# Patient Record
Sex: Female | Born: 1955 | Race: Black or African American | Hispanic: No | Marital: Single | Smoking: Never smoker
Health system: Southern US, Community
[De-identification: ages and names within clinical notes are randomized; demographics above are authoritative.]

## PROBLEM LIST (undated history)

## (undated) DIAGNOSIS — E119 Type 2 diabetes mellitus without complications: Secondary | ICD-10-CM

## (undated) DIAGNOSIS — Z9221 Personal history of antineoplastic chemotherapy: Secondary | ICD-10-CM

## (undated) DIAGNOSIS — C801 Malignant (primary) neoplasm, unspecified: Secondary | ICD-10-CM

## (undated) DIAGNOSIS — I10 Essential (primary) hypertension: Secondary | ICD-10-CM

## (undated) HISTORY — PX: ABDOMINAL HYSTERECTOMY: SHX81

## (undated) HISTORY — DX: Essential (primary) hypertension: I10

## (undated) HISTORY — DX: Type 2 diabetes mellitus without complications: E11.9

---
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
## 2002-07-24 HISTORY — PX: APPENDECTOMY: SHX54

## 2009-07-24 HISTORY — PX: BREAST BIOPSY: SHX20

## 2011-06-21 ENCOUNTER — Ambulatory Visit: Payer: Self-pay

## 2012-07-31 ENCOUNTER — Ambulatory Visit: Payer: Self-pay

## 2013-08-04 ENCOUNTER — Ambulatory Visit: Payer: Self-pay

## 2013-08-14 ENCOUNTER — Ambulatory Visit: Payer: Self-pay

## 2013-08-14 IMAGING — US US BREAST*R* LIMITED INC AXILLA
1 series · 7 of 7 positions shown · non-contrast
Comparison: Previous examinations.

CLINICAL DATA: Spontaneous clear right nipple discharge for the
past month.

EXAM:
DIGITAL DIAGNOSTIC  BILATERAL MAMMOGRAM WITH CAD
ULTRASOUND RIGHT BREAST

[Series 1: us breast*right* limited inc axilla · 0.08mm/px · 7 of 7 slices shown]
[im 1/7]
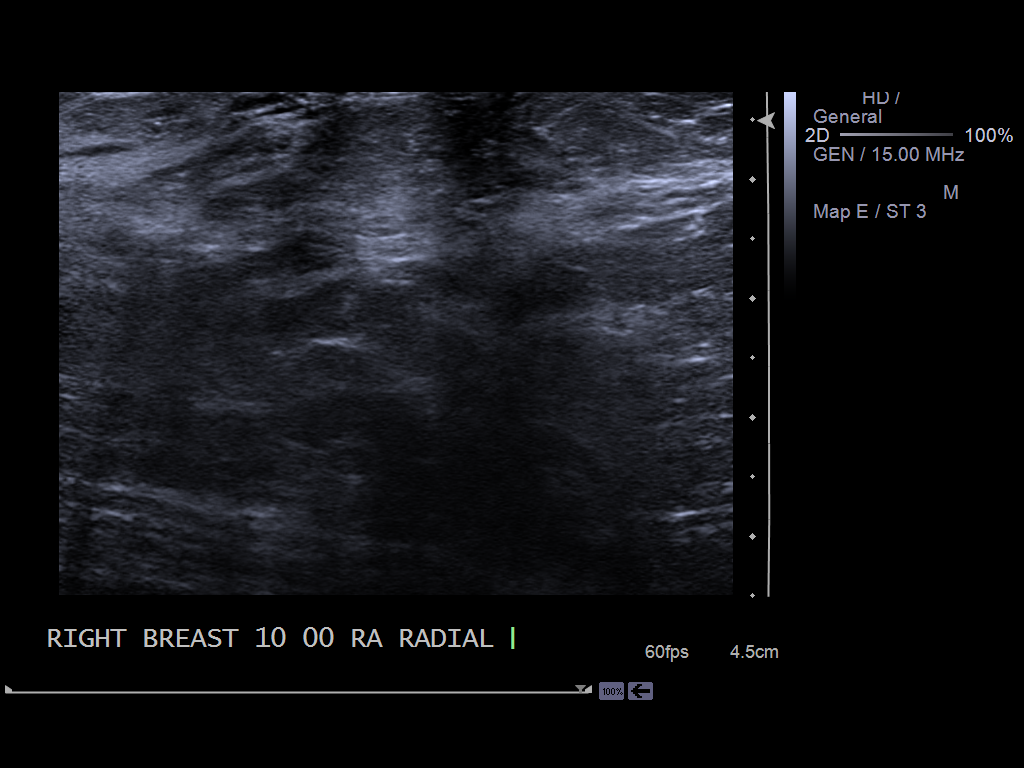
[im 2/7]
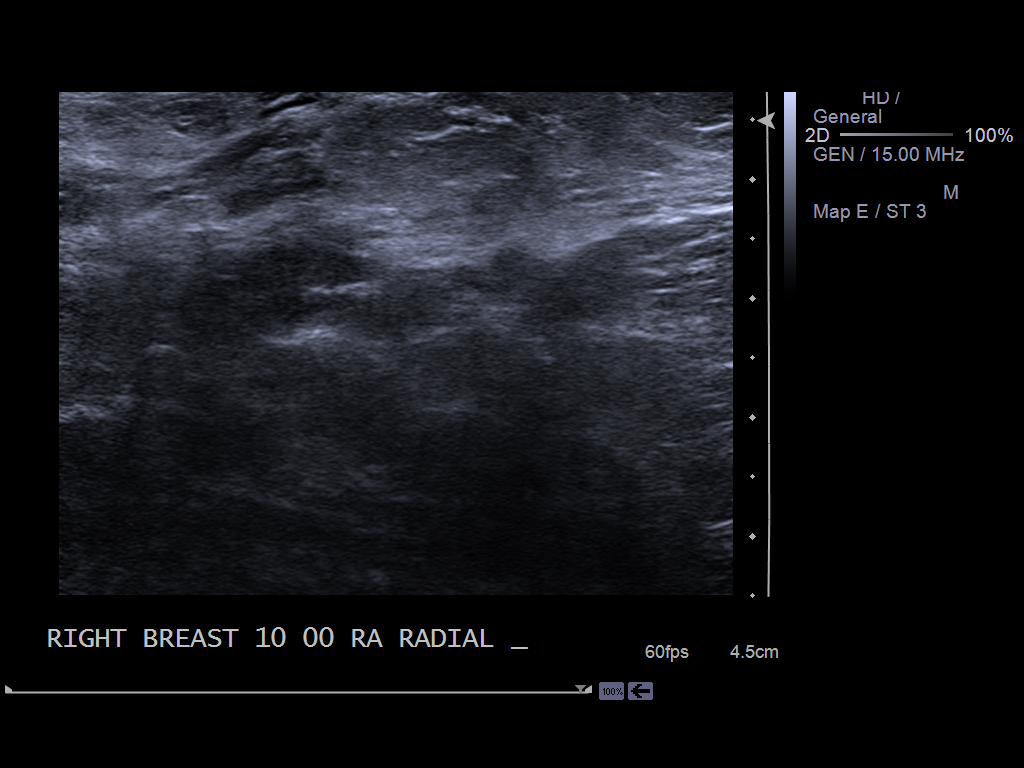
[im 3/7]
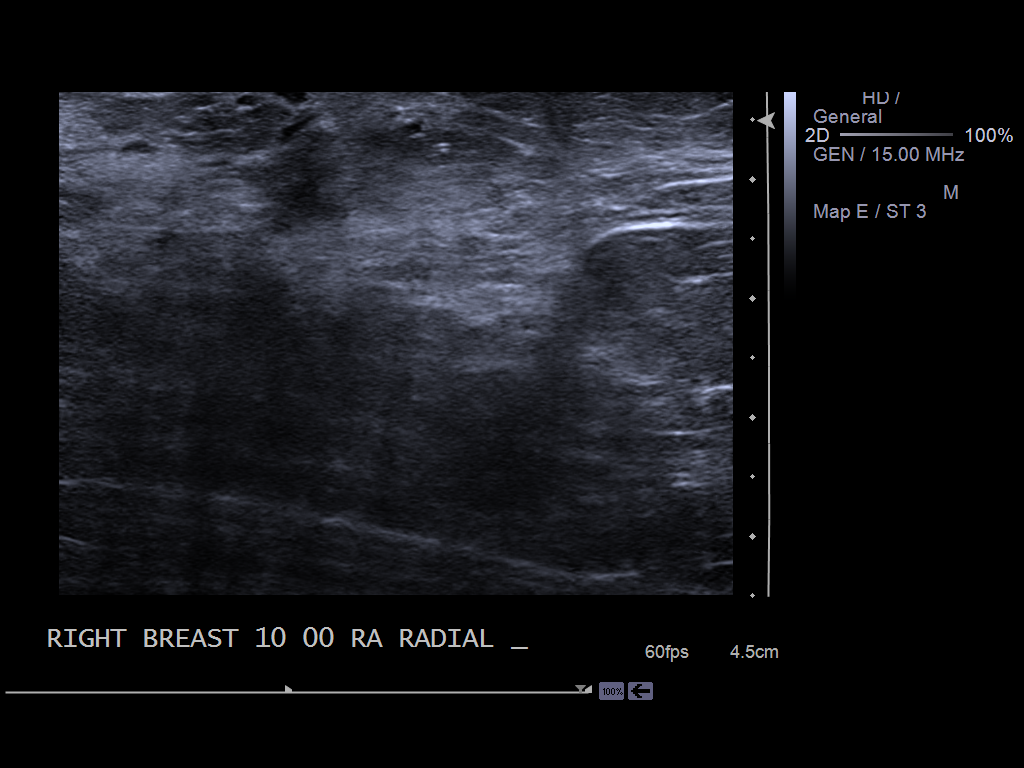
[im 4/7]
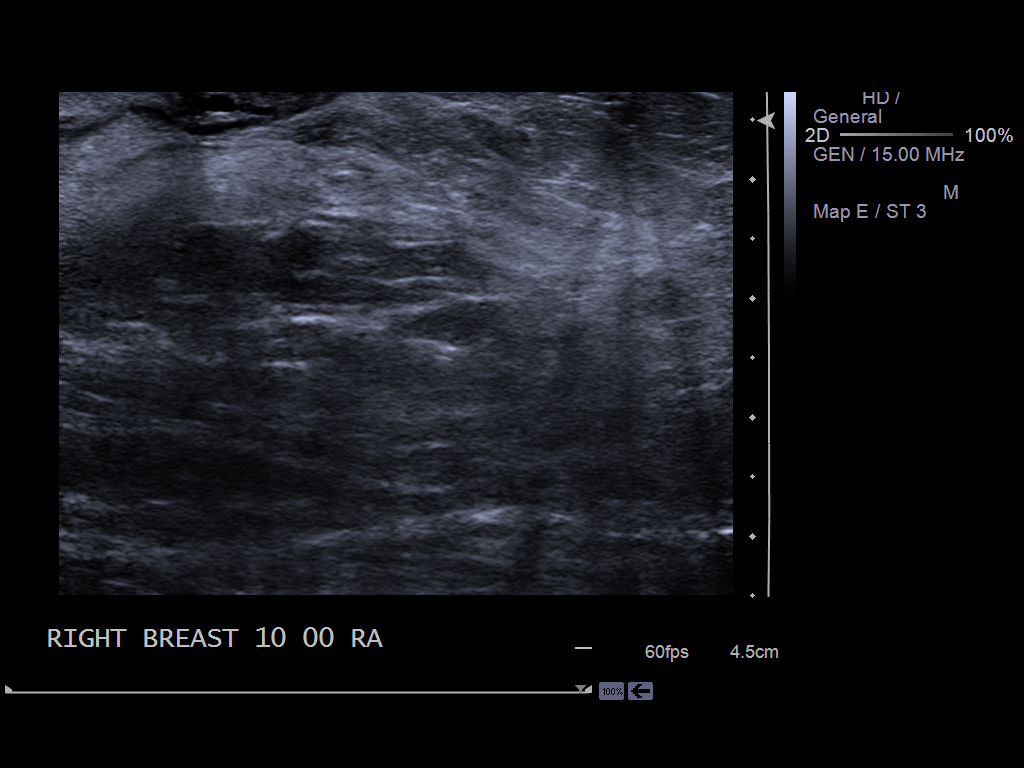
[im 5/7]
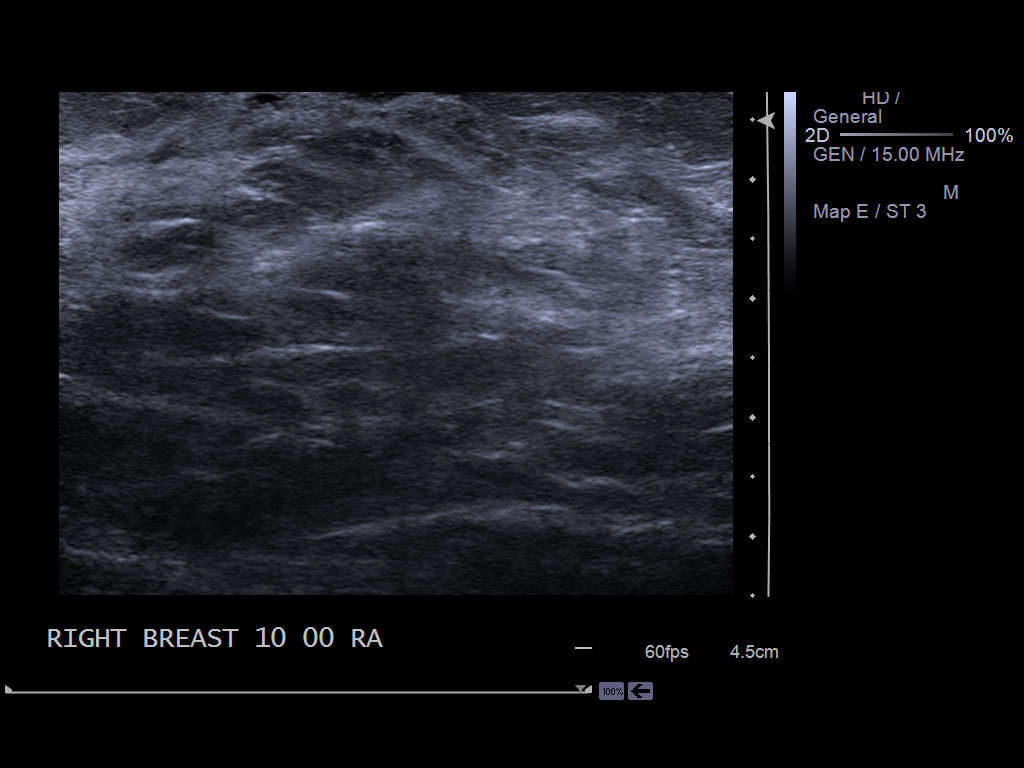
[im 6/7]
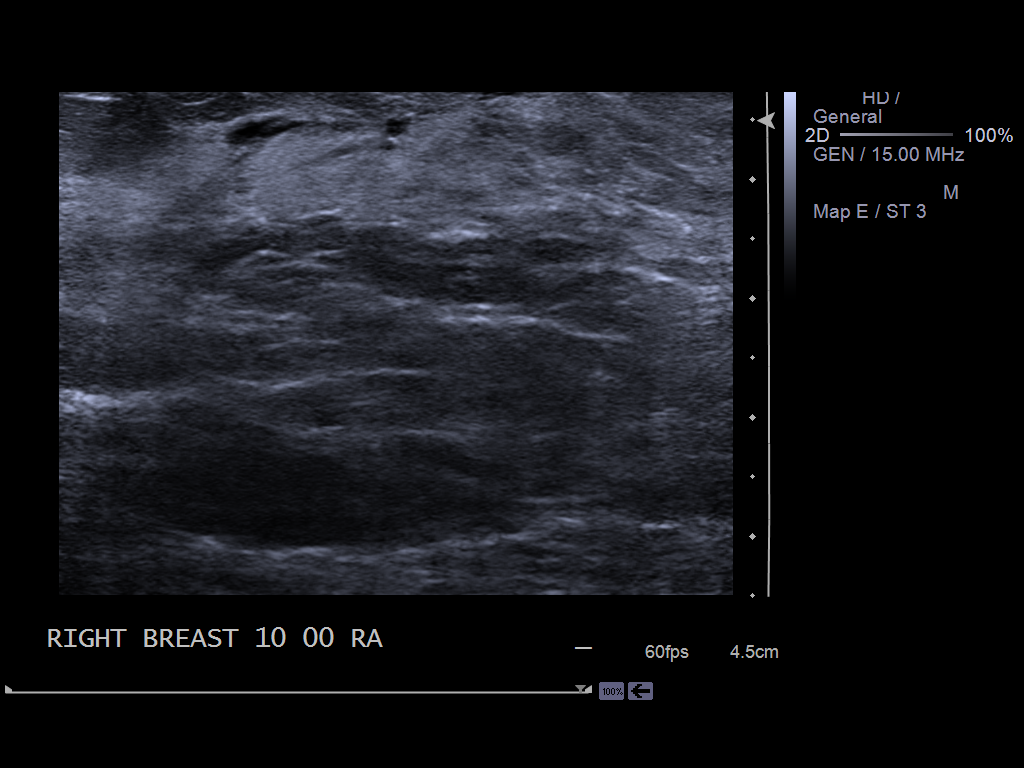
[im 7/7]
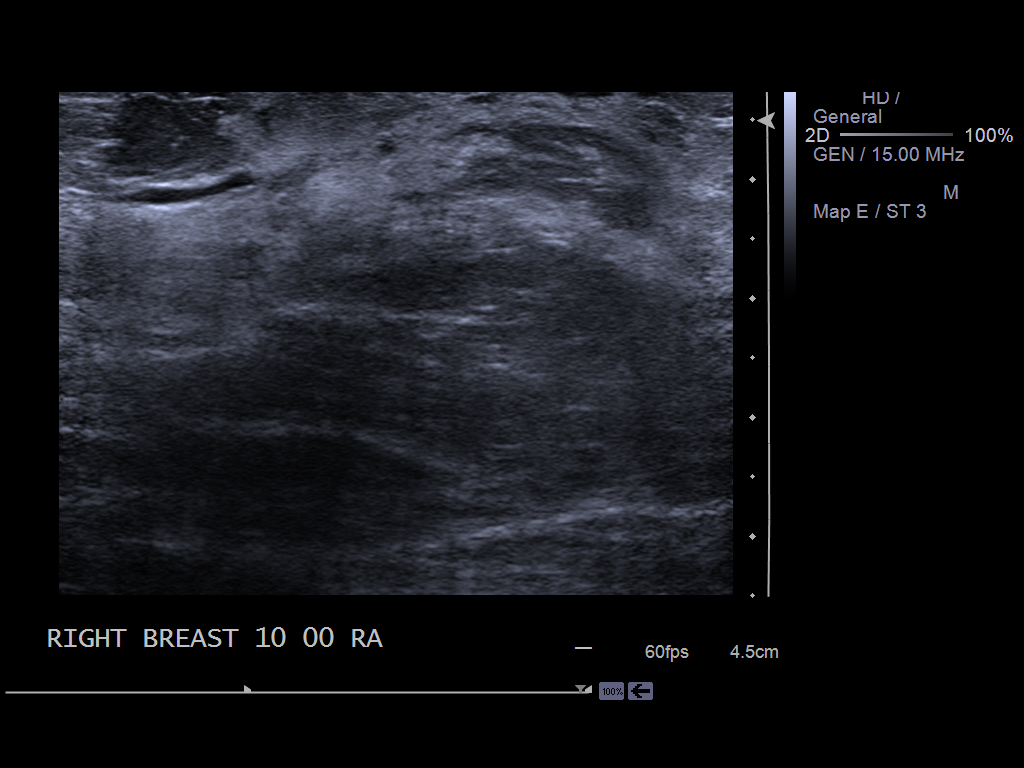

[7 of 7 positions shown; findings below may reference images not displayed]

ACR Breast Density Category b: There are scattered areas of
fibroglandular density.
FINDINGS: Stable mammographic appearance of the breasts with no new findings
suspicious for malignancy in either breast.

Mammographic images were processed with CAD.

On physical exam, no mass is palpable in the right breast. A small
amount of clear discharge was elicited from a single duct in the
central portion of the right nipple, slightly laterally and slightly
superiorly.

Ultrasound is performed, showing mildly dilated retroareolar ducts
on the right with no intraductal masses seen.
IMPRESSION: Mild right duct ectasia. No cause for the spontaneous single duct
discharge on the right seen at mammography or ultrasound. Further
evaluation with a right ductogram is recommended. This is scheduled
to follow.

RECOMMENDATION:
Right ductogram (scheduled follow).

I have discussed the findings and recommendations with the patient.
Results were also provided in writing at the conclusion of the
visit.

BI-RADS CATEGORY  0: Incomplete. Need additional imaging evaluation
and/or prior mammograms for comparison.

## 2013-08-19 ENCOUNTER — Ambulatory Visit (INDEPENDENT_AMBULATORY_CARE_PROVIDER_SITE_OTHER): Payer: PRIVATE HEALTH INSURANCE | Admitting: General Surgery

## 2013-08-19 ENCOUNTER — Encounter: Payer: Self-pay | Admitting: General Surgery

## 2013-08-19 VITALS — BP 140/80 | HR 78 | Resp 14 | Ht 66.0 in | Wt 211.0 lb

## 2013-08-19 DIAGNOSIS — N6452 Nipple discharge: Secondary | ICD-10-CM

## 2013-08-19 DIAGNOSIS — N6459 Other signs and symptoms in breast: Secondary | ICD-10-CM

## 2013-08-19 NOTE — Progress Notes (Signed)
Patient ID: Kristina Orozco, female   DOB: Apr 30, 1956, 58 y.o.   MRN: [id number]  Chief Complaint  Patient presents with  . Other    mammogram    HPI Kristina Orozco is a 58 y.o. female. who presents for a breast evaluation. The most recent mammogram was done on 08/14/13. The patient also had an ultrasound as well as a ductogram done at this time.Patient states she has been seeing clear fluid coming out of right nipple for about two months. Patient does perform regular self breast checks and gets regular mammograms done.    HPI  History reviewed. No pertinent past medical history.  Past Surgical History  Procedure Laterality Date  . Abdominal hysterectomy    . Appendectomy  2004  . Breast biopsy Left 2011    Family History  Problem Relation Age of Onset  . Pancreatic cancer Mother     Social History History  Substance Use Topics  . Smoking status: Never Smoker   . Smokeless tobacco: Never Used  . Alcohol Use: Yes    No Known Allergies  Current Outpatient Prescriptions  Medication Sig Dispense Refill  . calcium acetate (PHOSLO) 667 MG capsule Take by mouth 3 (three) times daily with meals.      . Multiple Vitamins-Minerals (MULTIVITAMIN WITH MINERALS) tablet Take 1 tablet by mouth daily.       No current facility-administered medications for this visit.    Review of Systems Review of Systems  Constitutional: Negative.   Respiratory: Negative.   Cardiovascular: Negative.     Blood pressure 140/80, pulse 78, resp. rate 14, height 5\' 6"  (1.676 m), weight 211 lb (95.709 kg).  Physical Exam Physical Exam  Constitutional: She is oriented to person, place, and time. She appears well-developed and well-nourished.  Eyes: Conjunctivae are normal.  Neck: Neck supple.  Cardiovascular: Normal rate, regular rhythm and normal heart sounds.   Pulmonary/Chest: Effort normal and breath sounds normal. Right breast exhibits nipple discharge ( watery drainage sourse is the upper outer  quadrant). Right breast exhibits no inverted nipple, no mass, no skin change and no tenderness. Left breast exhibits no inverted nipple, no mass, no nipple discharge, no skin change and no tenderness.  Abdominal: Bowel sounds are normal.  Lymphadenopathy:    She has no cervical adenopathy.    She has no axillary adenopathy.  Neurological: She is alert and oriented to person, place, and time.  Skin: Skin is warm and dry.    Data Reviewed Mammogram and ultrasound and ductogram reviewed  Assessment  Single duct with filling defect upper outer quadrant right breast needs excision of this . Discussed fully with patient     Plan    Patient to have surgery right ductal excision .      This patient's surgery has been scheduled for 08-28-13 at [facility]. Jeanella Anton, BCCCP, has been notified of plans for surgery.  Jazzy Parmer G 08/19/2013, 10:37 AM

## 2013-08-19 NOTE — Patient Instructions (Signed)
Patient's surgery has been scheduled for 08-28-13 at [facility].

## 2013-08-26 HISTORY — PX: BREAST BIOPSY: SHX20

## 2013-08-28 ENCOUNTER — Ambulatory Visit: Payer: Self-pay | Admitting: General Surgery

## 2013-08-28 DIAGNOSIS — N6459 Other signs and symptoms in breast: Secondary | ICD-10-CM

## 2013-08-28 HISTORY — PX: OTHER SURGICAL HISTORY: SHX169

## 2013-08-29 ENCOUNTER — Encounter: Payer: Self-pay | Admitting: General Surgery

## 2013-09-02 LAB — PATHOLOGY REPORT

## 2013-09-03 ENCOUNTER — Telehealth: Payer: Self-pay | Admitting: *Deleted

## 2013-09-03 ENCOUNTER — Encounter: Payer: Self-pay | Admitting: General Surgery

## 2013-09-03 NOTE — Telephone Encounter (Signed)
Notified patient as instructed, patient pleased. Discussed follow-up appointments, patient agrees  

## 2013-09-03 NOTE — Telephone Encounter (Signed)
Message copied by Carson Myrtle on Wed Sep 03, 2013  4:58 PM ------      Message from: Christene Lye      Created: Wed Sep 03, 2013  3:06 PM       Path showed intraductal papilloma. No atypia.      Please advise pt-results are ok. ------

## 2013-09-08 ENCOUNTER — Ambulatory Visit (INDEPENDENT_AMBULATORY_CARE_PROVIDER_SITE_OTHER): Payer: Self-pay | Admitting: General Surgery

## 2013-09-08 ENCOUNTER — Encounter: Payer: Self-pay | Admitting: General Surgery

## 2013-09-08 VITALS — BP 140/82 | HR 74 | Resp 12 | Ht 66.0 in | Wt 222.0 lb

## 2013-09-08 DIAGNOSIS — D249 Benign neoplasm of unspecified breast: Secondary | ICD-10-CM

## 2013-09-08 NOTE — Progress Notes (Signed)
This is a 58 year old female here today for right breast biopsy done on 08/26/13. Patient states she is doing well with no problems at this time.  Right areolar looks clean and healing well. Path showed intraductal papilloma.  Patient to return in five months.

## 2013-09-08 NOTE — Patient Instructions (Signed)
Patient to return in five months. Continue to do monthly self breast checks.

## 2014-02-09 ENCOUNTER — Encounter: Payer: Self-pay | Admitting: General Surgery

## 2014-02-09 ENCOUNTER — Ambulatory Visit (INDEPENDENT_AMBULATORY_CARE_PROVIDER_SITE_OTHER): Payer: PRIVATE HEALTH INSURANCE | Admitting: General Surgery

## 2014-02-09 VITALS — BP 122/80 | HR 80 | Resp 14 | Ht 66.0 in | Wt 214.0 lb

## 2014-02-09 DIAGNOSIS — D249 Benign neoplasm of unspecified breast: Secondary | ICD-10-CM

## 2014-02-09 DIAGNOSIS — D241 Benign neoplasm of right breast: Secondary | ICD-10-CM

## 2014-02-09 NOTE — Progress Notes (Signed)
Patient ID: Kristina Orozco, female   DOB: [birth year], 58 y.o.   MRN: [id number]  Chief Complaint  Patient presents with  . Follow-up    breast    HPI Kristina Orozco is a 58 y.o. female here today for her 5 month follow . Patient had a right breast biopsy done on 08/26/13. Patient states she is doing well at this time. No nipple discharge   HPI  Past Medical History  Diagnosis Date  . Hypertension   . Diabetes mellitus without complication     Past Surgical History  Procedure Laterality Date  . Abdominal hysterectomy    . Appendectomy  2004  . Breast biopsy Left 2011  . Breast biopsy Right 08/26/13  . Breast mass excision Right 08/28/13    papilloma    Family History  Problem Relation Age of Onset  . Pancreatic cancer Mother     Social History History  Substance Use Topics  . Smoking status: Never Smoker   . Smokeless tobacco: Never Used  . Alcohol Use: Yes    No Known Allergies  Current Outpatient Prescriptions  Medication Sig Dispense Refill  . calcium acetate (PHOSLO) 667 MG capsule Take by mouth 3 (three) times daily with meals.      . hydrochlorothiazide (HYDRODIURIL) 25 MG tablet Take 25 mg by mouth daily.      . Multiple Vitamins-Minerals (MULTIVITAMIN WITH MINERALS) tablet Take 1 tablet by mouth daily.       No current facility-administered medications for this visit.    Review of Systems Review of Systems  Constitutional: Negative.   Respiratory: Negative.   Cardiovascular: Negative.     Blood pressure 122/80, pulse 80, resp. rate 14, height 5\' 6"  (1.676 m), weight 214 lb (97.07 kg).  Physical Exam Physical Exam  Constitutional: She is oriented to person, place, and time. She appears well-developed and well-nourished.  Eyes: Conjunctivae are normal. No scleral icterus.  Neck: Neck supple. No thyromegaly present.  Pulmonary/Chest: Right breast exhibits no inverted nipple, no mass, no nipple discharge, no skin change and no tenderness. Left breast  exhibits no inverted nipple, no mass, no nipple discharge, no skin change and no tenderness.  Right breast incision well healed.   Lymphadenopathy:    She has no cervical adenopathy.    She has no axillary adenopathy.  Neurological: She is alert and oriented to person, place, and time.  Skin: Skin is warm and dry.    Data Reviewed Prior pathology.   Assessment    Statis post right breast mass excision. Intraductal papilloma. Normal breast exam today.     Plan    Patient to have mammogram in January 2016. She is to arrange this through [facility].        Kristina Orozco 02/10/2014, 5:32 AM

## 2014-02-09 NOTE — Patient Instructions (Signed)
Continue self breast exams. Call office for any new breast issues or concerns. Patient to return as needed.

## 2014-02-10 ENCOUNTER — Encounter: Payer: Self-pay | Admitting: General Surgery

## 2014-05-25 ENCOUNTER — Encounter: Payer: Self-pay | Admitting: General Surgery

## 2014-11-14 NOTE — Op Note (Signed)
PATIENT NAME:  [place], Kristina Orozco MR#:  [id number] DATE OF BIRTH:  October 03, 1955  DATE OF PROCEDURE:  08/28/2013  PREOPERATIVE DIAGNOSIS: Right breast nipple discharge.   POSTOPERATIVE DIAGNOSIS: Right breast nipple discharge.   OPERATION: Right breast subareolar duct excision with ultrasound guidance.   SURGEON: S.G. Jamal Collin, M.D.   ANESTHESIA: General.   COMPLICATIONS: None.   ESTIMATED BLOOD LOSS: Minimal.   DRAINS: None.   DESCRIPTION OF PROCEDURE: The patient was put to sleep with an LMA. The right breast was prepped and draped out as a sterile field. Timeout was performed. Ultrasound probe was brought up to the field. In the location of a dilated duct in the upper outer quadrant of the right breast was marked. The pressure over this reproduced the clear discharge. A circumareolar incision was mapped out. A local anesthetic of 0.5% Marcaine was instilled for postoperative analgesia. Incision was made from the 9 to 12-o'clock position. The areolar skin was lifted up all the way to the nipple and the skin and subcutaneous tissue dissected out laterally also. Close to the nipple area, a duct with some clear drainage was identified. This duct was then excised going out to a distance of about 5 cm with some surrounding tissue around it. There did not appear to be any other grossly dilated ducts or other palpable abnormality. The tissue was sent in formalin for pathology. Hemostasis was obtained with cautery. The deeper tissue was closed with interrupted 2-0 Vicryl and the skin closed with subcuticular 4-0 Vicryl covered with Dermabond. The procedure was well tolerated. She was subsequently returned to the recovery room in stable condition.   ____________________________ S.Robinette Haines, MD sgs:aw D: 08/29/2013 08:31:47 ET T: 08/29/2013 08:51:27 ET JOB#: [id number]  cc: Synthia Innocent. Jamal Collin, MD, <Dictator> [facility] Robinette Haines MD ELECTRONICALLY SIGNED 08/30/2013 9:08

## 2018-01-15 ENCOUNTER — Other Ambulatory Visit: Payer: Self-pay

## 2018-01-15 DIAGNOSIS — Z1211 Encounter for screening for malignant neoplasm of colon: Secondary | ICD-10-CM

## 2018-01-30 ENCOUNTER — Encounter: Payer: Self-pay | Admitting: *Deleted

## 2018-01-31 ENCOUNTER — Ambulatory Visit: Payer: BLUE CROSS/BLUE SHIELD | Admitting: Registered Nurse

## 2018-01-31 ENCOUNTER — Ambulatory Visit
Admission: RE | Admit: 2018-01-31 | Discharge: 2018-01-31 | Disposition: A | Discharge status: Home / Self Care | Payer: BLUE CROSS/BLUE SHIELD | Source: Ambulatory Visit | Attending: Gastroenterology | Admitting: Gastroenterology

## 2018-01-31 ENCOUNTER — Encounter
Admission: RE | Disposition: A | Discharge status: Home / Self Care | Payer: Self-pay | Source: Ambulatory Visit | Attending: Gastroenterology

## 2018-01-31 DIAGNOSIS — D123 Benign neoplasm of transverse colon: Secondary | ICD-10-CM | POA: Diagnosis not present

## 2018-01-31 DIAGNOSIS — D120 Benign neoplasm of cecum: Secondary | ICD-10-CM | POA: Diagnosis not present

## 2018-01-31 DIAGNOSIS — I10 Essential (primary) hypertension: Secondary | ICD-10-CM | POA: Insufficient documentation

## 2018-01-31 DIAGNOSIS — Z8371 Family history of colonic polyps: Secondary | ICD-10-CM | POA: Diagnosis not present

## 2018-01-31 DIAGNOSIS — E119 Type 2 diabetes mellitus without complications: Secondary | ICD-10-CM | POA: Insufficient documentation

## 2018-01-31 DIAGNOSIS — Z6834 Body mass index (BMI) 34.0-34.9, adult: Secondary | ICD-10-CM | POA: Insufficient documentation

## 2018-01-31 DIAGNOSIS — Z800 Family history of malignant neoplasm of digestive organs: Secondary | ICD-10-CM | POA: Diagnosis not present

## 2018-01-31 DIAGNOSIS — K5730 Diverticulosis of large intestine without perforation or abscess without bleeding: Secondary | ICD-10-CM | POA: Insufficient documentation

## 2018-01-31 DIAGNOSIS — Z1211 Encounter for screening for malignant neoplasm of colon: Secondary | ICD-10-CM | POA: Insufficient documentation

## 2018-01-31 DIAGNOSIS — Z79899 Other long term (current) drug therapy: Secondary | ICD-10-CM | POA: Insufficient documentation

## 2018-01-31 DIAGNOSIS — D124 Benign neoplasm of descending colon: Secondary | ICD-10-CM

## 2018-01-31 HISTORY — PX: COLONOSCOPY WITH PROPOFOL: SHX5780

## 2018-01-31 SURGERY — COLONOSCOPY WITH PROPOFOL
Anesthesia: General

## 2018-01-31 MED ORDER — SODIUM CHLORIDE 0.9 % IV SOLN
INTRAVENOUS | Status: DC
  Administered 2018-01-31: 1000 mL via INTRAVENOUS

## 2018-01-31 MED ORDER — LIDOCAINE HCL (PF) 1 % IJ SOLN
INTRAMUSCULAR | Status: AC
  Administered 2018-01-31: 0.3 mL via INTRADERMAL
  Filled 2018-01-31: qty 2

## 2018-01-31 MED ORDER — LIDOCAINE HCL (PF) 1 % IJ SOLN
2.0000 mL | Freq: Once | INTRAMUSCULAR | Status: AC
  Administered 2018-01-31: 0.3 mL via INTRADERMAL

## 2018-01-31 MED ORDER — PROPOFOL 500 MG/50ML IV EMUL
INTRAVENOUS | Status: DC | PRN
  Administered 2018-01-31: 140 ug/kg/min via INTRAVENOUS

## 2018-01-31 MED ORDER — LIDOCAINE HCL (CARDIAC) PF 100 MG/5ML IV SOSY
PREFILLED_SYRINGE | INTRAVENOUS | Status: DC | PRN
  Administered 2018-01-31: 40 mg via INTRAVENOUS

## 2018-01-31 MED ORDER — PROPOFOL 10 MG/ML IV BOLUS
INTRAVENOUS | Status: DC | PRN
  Administered 2018-01-31: 30 mg via INTRAVENOUS
  Administered 2018-01-31: 70 mg via INTRAVENOUS
  Administered 2018-01-31: 20 mg via INTRAVENOUS

## 2018-01-31 NOTE — Anesthesia Post-op Follow-up Note (Signed)
Anesthesia QCDR form completed.        

## 2018-01-31 NOTE — Anesthesia Postprocedure Evaluation (Addendum)
Anesthesia Post Note  Patient: Kristina Orozco  Procedure(s) Performed: COLONOSCOPY WITH PROPOFOL (N/A )  Patient location during evaluation: Endoscopy Anesthesia Type: General Level of consciousness: awake and alert Pain management: pain level controlled Vital Signs Assessment: post-procedure vital signs reviewed and stable Respiratory status: spontaneous breathing, nonlabored ventilation and respiratory function stable Cardiovascular status: blood pressure returned to baseline and stable Postop Assessment: no apparent nausea or vomiting Anesthetic complications: no     Last Vitals:  Vitals:   01/31/18 0920 01/31/18 0930  BP: 116/79 120/87  Pulse: 84 79  Resp: 14 16  Temp:    SpO2: 100% 97%    Last Pain:  Vitals:   01/31/18 0900  TempSrc: Tympanic                 Suhani Stillion Harvie Heck

## 2018-01-31 NOTE — H&P (Addendum)
Jonathon Bellows, MD [address], [place], [place], [place], [address] [address], [place], [place], [place], [address] Phone: [phone]  Fax: [phone]  Primary Care Physician:  Theotis Burrow, MD   Pre-Procedure History & Physical: HPI:  Kristina Orozco is a 62 y.o. female is here for an colonoscopy.   Past Medical History:  Diagnosis Date  . Diabetes mellitus without complication ([place])   . Hypertension     Past Surgical History:  Procedure Laterality Date  . ABDOMINAL HYSTERECTOMY    . APPENDECTOMY  2004  . BREAST BIOPSY Left 2011  . BREAST BIOPSY Right 08/26/13  . 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/28/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    Prior to Admission medications   Medication Sig Start Date End Date Taking? Authorizing Provider  calcium acetate (PHOSLO) 667 MG capsule Take by mouth 3 (three) times daily with meals.    [provider]  hydrochlorothiazide (HYDRODIURIL) 25 MG tablet Take 25 mg by mouth daily.    [provider]  Multiple Vitamins-Minerals (MULTIVITAMIN WITH MINERALS) tablet Take 1 tablet by mouth daily.    [provider]    Allergies as of 01/15/2018  . (No Known Allergies)    Family History  Problem Relation Age of Onset  . Pancreatic cancer Mother     Social History   Socioeconomic History  . Marital status: Single    Spouse name: Not on file  . Number of children: Not on file  . Years of education: Not on file  . Highest education level: Not on file  Occupational History  . Not on file  Social Needs  . Financial resource strain: Not on file  . Food insecurity:    Worry: Not on file    Inability: Not on file  . Transportation needs:    Medical: Not on file    Non-medical: Not on file  Tobacco Use  . Smoking status: Never Smoker  . Smokeless tobacco: Never Used  Substance and Sexual Activity  . Alcohol use: Yes  . Drug use: No  . Sexual activity: Not on file  Lifestyle  . Physical activity:    Days  per week: Not on file    Minutes per session: Not on file  . Stress: Not on file  Relationships  . Social connections:    Talks on phone: Not on file    Gets together: Not on file    Attends religious service: Not on file    Active member of club or organization: Not on file    Attends meetings of clubs or organizations: Not on file    Relationship status: Not on file  . Intimate partner violence:    Fear of current or ex partner: Not on file    Emotionally abused: Not on file    Physically abused: Not on file    Forced sexual activity: Not on file  Other Topics Concern  . Not on file  Social History Narrative  . Not on file    Review of Systems: See HPI, otherwise negative ROS  Physical Exam: There were no vitals taken for this visit. General:   Alert,  pleasant and cooperative in NAD Head:  Normocephalic and atraumatic. Neck:  Supple; no masses or thyromegaly. Lungs:  Clear throughout to auscultation, normal respiratory effort.    Heart:  +S1, +S2, Regular rate and rhythm, No edema. Abdomen:  Soft, nontender and nondistended. Normal bowel sounds, without guarding, and without rebound.  Neurologic:  Alert and  oriented x4;  grossly normal neurologically.  Impression/Plan: Kristina Orozco is here for an colonoscopy to be performed for Screening colonoscopy high risk Risks, benefits, limitations, and alternatives regarding  colonoscopy have been reviewed with the patient.  Questions have been answered.  All parties agreeable.   Jonathon Bellows, MD  01/31/2018, 7:55 AM

## 2018-01-31 NOTE — Op Note (Signed)
[facility] Patient Name: Kristina Orozco Procedure Date: 01/31/2018 8:35 AM MRN: [id number] Account #: [id number] Date of Birth: 09-01-1955 Admit Type: Outpatient Age: 62 Room: [facility] ENDO ROOM 3 Gender: Female Note Status: Finalized Procedure:            Colonoscopy Indications:          Colon cancer screening in patient at increased risk:                        Family history of 1st-degree relative with colon polyps Providers:            Jonathon Bellows MD, MD Referring MD:         Elyse Jarvis Revelo (Referring MD) Medicines:            Monitored Anesthesia Care Complications:        No immediate complications. Procedure:            Pre-Anesthesia Assessment:                       - Prior to the procedure, a History and Physical was                        performed, and patient medications, allergies and                        sensitivities were reviewed. The patient's tolerance of                        previous anesthesia was reviewed.                       - The risks and benefits of the procedure and the                        sedation options and risks were discussed with the                        patient. All questions were answered and informed                        consent was obtained.                       - ASA Grade Assessment: II - A patient with mild                        systemic disease.                       After obtaining informed consent, the colonoscope was                        passed under direct vision. Throughout the procedure,                        the patient's blood pressure, pulse, and oxygen                        saturations were monitored continuously. The  Colonoscope was introduced through the anus and                        advanced to the the cecum, identified by the                        appendiceal orifice, IC valve and transillumination.                        The colonoscopy was  performed with ease. The patient                        tolerated the procedure well. The quality of the bowel                        preparation was good. Findings:      The perianal and digital rectal examinations were normal.      Two sessile polyps were found in the transverse colon and cecum. The       polyps were 3 to 4 mm in size. These polyps were removed with a cold       biopsy forceps. Resection and retrieval were complete.      A 8 mm polyp was found in the transverse colon. The polyp was sessile.       The polyp was removed with a hot snare. Resection and retrieval were       complete.      A 12 mm polyp was found in the proximal descending colon. The polyp was       semi-pedunculated. The polyp was removed with a hot snare. Resection and       retrieval were complete.      Multiple small-mouthed diverticula were found in the entire colon.      The exam was otherwise without abnormality.      The exam was otherwise without abnormality on direct and retroflexion       views. Impression:           - Two 3 to 4 mm polyps in the transverse colon and in                        the cecum, removed with a cold biopsy forceps. Resected                        and retrieved.                       - One 8 mm polyp in the transverse colon, removed with                        a hot snare. Resected and retrieved.                       - One 12 mm polyp in the proximal descending colon,                        removed with a hot snare. Resected and retrieved.                       - Diverticulosis in the entire examined colon.                       -  The examination was otherwise normal.                       - The examination was otherwise normal on direct and                        retroflexion views. Recommendation:       - Discharge patient to home (with escort).                       - Resume previous diet.                       - Continue present medications.                       -  Await pathology results.                       - Repeat colonoscopy in 3 years for surveillance. Procedure Code(s):    --- Professional ---                       [phone], Colonoscopy, flexible; with removal of tumor(s),                        polyp(s), or other lesion(s) by snare technique                       45380, 43, Colonoscopy, flexible; with biopsy, single                        or multiple Diagnosis Code(s):    --- Professional ---                       Z83.71, Family history of colonic polyps                       D12.0, Benign neoplasm of cecum                       D12.3, Benign neoplasm of transverse colon (hepatic                        flexure or splenic flexure)                       D12.4, Benign neoplasm of descending colon                       K57.30, Diverticulosis of large intestine without                        perforation or abscess without bleeding CPT copyright 2017 American Medical Association. All rights reserved. The codes documented in this report are preliminary and upon coder review may  be revised to meet current compliance requirements. Jonathon Bellows, MD Jonathon Bellows MD, MD 01/31/2018 9:07:20 AM This report has been signed electronically. Number of Addenda: 0 Note Initiated On: 01/31/2018 8:35 AM Scope Withdrawal Time: 0 hours 16 minutes 57 seconds  Total Procedure Duration: 0 hours 19 minutes 45 seconds       [facility]

## 2018-01-31 NOTE — Transfer of Care (Signed)
Immediate Anesthesia Transfer of Care Note  Patient: Kristina Orozco  Procedure(s) Performed: COLONOSCOPY WITH PROPOFOL (N/A )  Patient Location: PACU  Anesthesia Type:General  Level of Consciousness: sedated  Airway & Oxygen Therapy: Patient Spontanous Breathing and Patient connected to nasal cannula oxygen  Post-op Assessment: Report given to RN and Post -op Vital signs reviewed and stable  Post vital signs: Reviewed and stable  Last Vitals:  Vitals Value Taken Time  BP 90/62 01/31/2018  9:09 AM  Temp 36 C 01/31/2018  9:00 AM  Pulse 83 01/31/2018  9:09 AM  Resp 15 01/31/2018  9:09 AM  SpO2 96 % 01/31/2018  9:09 AM    Last Pain:  Vitals:   01/31/18 0900  TempSrc: Tympanic         Complications: No apparent anesthesia complications

## 2018-01-31 NOTE — Anesthesia Preprocedure Evaluation (Signed)
Anesthesia Evaluation  Patient identified by MRN, date of birth, ID band Patient awake    Reviewed: Allergy & Precautions, H&P , NPO status , reviewed documented beta blocker date and time   Airway Mallampati: II  TM Distance: >3 FB Neck ROM: full    Dental  (+) Missing, Chipped, Poor Dentition, Dental Advidsory Given   Pulmonary    Pulmonary exam normal        Cardiovascular hypertension, Normal cardiovascular exam     Neuro/Psych    GI/Hepatic   Endo/Other  diabetesMorbid obesity  Renal/GU      Musculoskeletal   Abdominal   Peds  Hematology   Anesthesia Other Findings Past Medical History: No date: Diabetes mellitus without complication ([place]) No date: Hypertension  Past Surgical History: No date: ABDOMINAL HYSTERECTOMY 2004: APPENDECTOMY 2011: BREAST BIOPSY; Left 08/26/13: BREAST BIOPSY; Right 08/28/13: breast mass excision; Right     Comment:  papilloma  BMI    Body Mass Index:  34.70 kg/m      Reproductive/Obstetrics                             Anesthesia Physical Anesthesia Plan  ASA: II  Anesthesia Plan: General   Post-op Pain Management:    Induction:   PONV Risk Score and Plan: 3 and Treatment may vary due to age or medical condition and TIVA  Airway Management Planned:   Additional Equipment:   Intra-op Plan:   Post-operative Plan:   Informed Consent: I have reviewed the patients History and Physical, chart, labs and discussed the procedure including the risks, benefits and alternatives for the proposed anesthesia with the patient or authorized representative who has indicated his/her understanding and acceptance.   Dental Advisory Given  Plan Discussed with: CRNA  Anesthesia Plan Comments:         Anesthesia Quick Evaluation

## 2018-02-01 LAB — SURGICAL PATHOLOGY

## 2018-02-02 ENCOUNTER — Encounter: Payer: Self-pay | Admitting: Gastroenterology

## 2018-02-03 ENCOUNTER — Encounter: Payer: Self-pay | Admitting: Gastroenterology

## 2019-09-29 ENCOUNTER — Ambulatory Visit: Payer: Self-pay

## 2019-09-29 ENCOUNTER — Other Ambulatory Visit: Payer: Self-pay

## 2019-09-29 ENCOUNTER — Ambulatory Visit (LOCAL_COMMUNITY_HEALTH_CENTER): Payer: Self-pay

## 2019-09-29 DIAGNOSIS — R7611 Nonspecific reaction to tuberculin skin test without active tuberculosis: Secondary | ICD-10-CM

## 2019-09-29 NOTE — Progress Notes (Signed)
Per prior records in [place] / scanned [facility] microfilm records, client with 20 mm TST 11/19/1973 and 03/17/1975 CXR negative for TB. TB screening form completed today and sent for scanning with original to client. Form indicating employer reimbursing agency for screening sent for scanning with original to R. Gutierrez in [place]. Rich Number, RN

## 2020-01-16 ENCOUNTER — Other Ambulatory Visit: Payer: Self-pay | Admitting: Family Medicine

## 2020-01-16 DIAGNOSIS — Z13820 Encounter for screening for osteoporosis: Secondary | ICD-10-CM

## 2020-05-16 ENCOUNTER — Other Ambulatory Visit: Payer: Self-pay

## 2020-05-16 ENCOUNTER — Inpatient Hospital Stay
Admission: EM | Admit: 2020-05-16 | Discharge: 2020-05-18 | DRG: 418 | Disposition: A | Discharge status: Home / Self Care | Payer: Medicare Other | Attending: General Surgery | Admitting: General Surgery

## 2020-05-16 ENCOUNTER — Emergency Department: Payer: Medicare Other

## 2020-05-16 ENCOUNTER — Encounter: Payer: Self-pay | Admitting: Emergency Medicine

## 2020-05-16 DIAGNOSIS — C23 Malignant neoplasm of gallbladder: Secondary | ICD-10-CM | POA: Diagnosis present

## 2020-05-16 DIAGNOSIS — E876 Hypokalemia: Secondary | ICD-10-CM | POA: Diagnosis present

## 2020-05-16 DIAGNOSIS — I10 Essential (primary) hypertension: Secondary | ICD-10-CM | POA: Diagnosis present

## 2020-05-16 DIAGNOSIS — K8000 Calculus of gallbladder with acute cholecystitis without obstruction: Secondary | ICD-10-CM | POA: Diagnosis present

## 2020-05-16 DIAGNOSIS — K810 Acute cholecystitis: Secondary | ICD-10-CM

## 2020-05-16 DIAGNOSIS — Z800 Family history of malignant neoplasm of digestive organs: Secondary | ICD-10-CM

## 2020-05-16 DIAGNOSIS — R1011 Right upper quadrant pain: Secondary | ICD-10-CM

## 2020-05-16 DIAGNOSIS — K8012 Calculus of gallbladder with acute and chronic cholecystitis without obstruction: Principal | ICD-10-CM | POA: Diagnosis present

## 2020-05-16 DIAGNOSIS — K660 Peritoneal adhesions (postprocedural) (postinfection): Secondary | ICD-10-CM | POA: Diagnosis present

## 2020-05-16 DIAGNOSIS — Z9049 Acquired absence of other specified parts of digestive tract: Secondary | ICD-10-CM

## 2020-05-16 DIAGNOSIS — C771 Secondary and unspecified malignant neoplasm of intrathoracic lymph nodes: Secondary | ICD-10-CM | POA: Diagnosis present

## 2020-05-16 DIAGNOSIS — E669 Obesity, unspecified: Secondary | ICD-10-CM | POA: Diagnosis present

## 2020-05-16 DIAGNOSIS — Z6835 Body mass index (BMI) 35.0-35.9, adult: Secondary | ICD-10-CM

## 2020-05-16 DIAGNOSIS — K429 Umbilical hernia without obstruction or gangrene: Secondary | ICD-10-CM | POA: Diagnosis present

## 2020-05-16 DIAGNOSIS — Z20822 Contact with and (suspected) exposure to covid-19: Secondary | ICD-10-CM | POA: Diagnosis present

## 2020-05-16 DIAGNOSIS — E119 Type 2 diabetes mellitus without complications: Secondary | ICD-10-CM | POA: Diagnosis present

## 2020-05-16 LAB — CBC
HCT: 41.6 % (ref 36.0–46.0)
Hemoglobin: 13.6 g/dL (ref 12.0–15.0)
MCH: 27.7 pg (ref 26.0–34.0)
MCHC: 32.7 g/dL (ref 30.0–36.0)
MCV: 84.7 fL (ref 80.0–100.0)
Platelets: 302 10*3/uL (ref 150–400)
RBC: 4.91 MIL/uL (ref 3.87–5.11)
RDW: 13.9 % (ref 11.5–15.5)
WBC: 12.9 10*3/uL — ABNORMAL HIGH (ref 4.0–10.5)
nRBC: 0 % (ref 0.0–0.2)

## 2020-05-16 LAB — URINALYSIS, COMPLETE (UACMP) WITH MICROSCOPIC
Bilirubin Urine: NEGATIVE
Glucose, UA: NEGATIVE mg/dL
Ketones, ur: 20 mg/dL — AB
Nitrite: NEGATIVE
Protein, ur: >=300 mg/dL — AB
RBC / HPF: >50 RBC/hpf — ABNORMAL HIGH (ref 0–5)
Specific Gravity, Urine: 1.024 (ref 1.005–1.030)
pH: 6 (ref 5.0–8.0)

## 2020-05-16 LAB — COMPREHENSIVE METABOLIC PANEL
ALT: 19 U/L (ref 0–44)
AST: 27 U/L (ref 15–41)
Albumin: 4.4 g/dL (ref 3.5–5.0)
Alkaline Phosphatase: 55 U/L (ref 38–126)
Anion gap: 14 (ref 5–15)
BUN: 11 mg/dL (ref 8–23)
CO2: 29 mmol/L (ref 22–32)
Calcium: 9.7 mg/dL (ref 8.9–10.3)
Chloride: 94 mmol/L — ABNORMAL LOW (ref 98–111)
Creatinine, Ser: 0.6 mg/dL (ref 0.44–1.00)
GFR, Estimated: >60 mL/min (ref >60)
Glucose, Bld: 143 mg/dL — ABNORMAL HIGH (ref 70–99)
Potassium: 3.3 mmol/L — ABNORMAL LOW (ref 3.5–5.1)
Sodium: 137 mmol/L (ref 135–145)
Total Bilirubin: 0.9 mg/dL (ref 0.3–1.2)
Total Protein: 8.4 g/dL — ABNORMAL HIGH (ref 6.5–8.1)

## 2020-05-16 LAB — LIPASE, BLOOD: Lipase: 37 U/L (ref 11–51)

## 2020-05-16 IMAGING — US US ABDOMEN LIMITED
1 series · 14 of 25 positions shown · non-contrast
Comparison: None.

CLINICAL DATA: Right upper quadrant pain and vomiting.

EXAM:
ULTRASOUND ABDOMEN LIMITED RIGHT UPPER QUADRANT

[Series 1: us abdomen limited ruq (liver/gb) · 54 acquisitions, 14 frames shown]
[im 1/54]
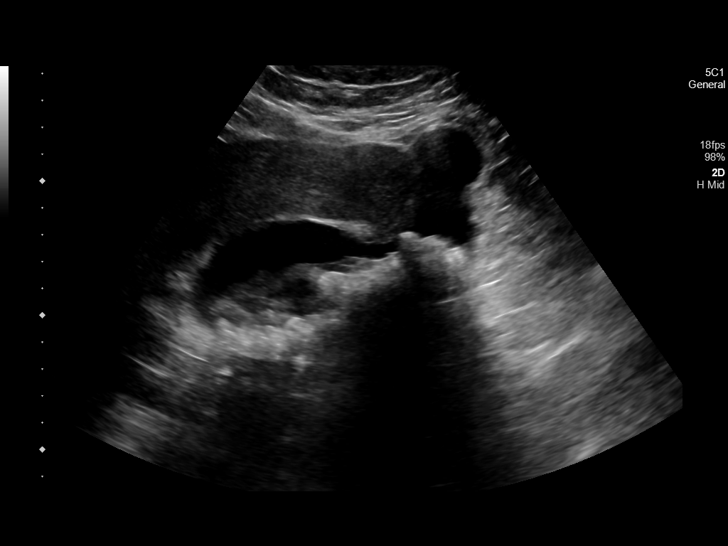
[im 5/54]
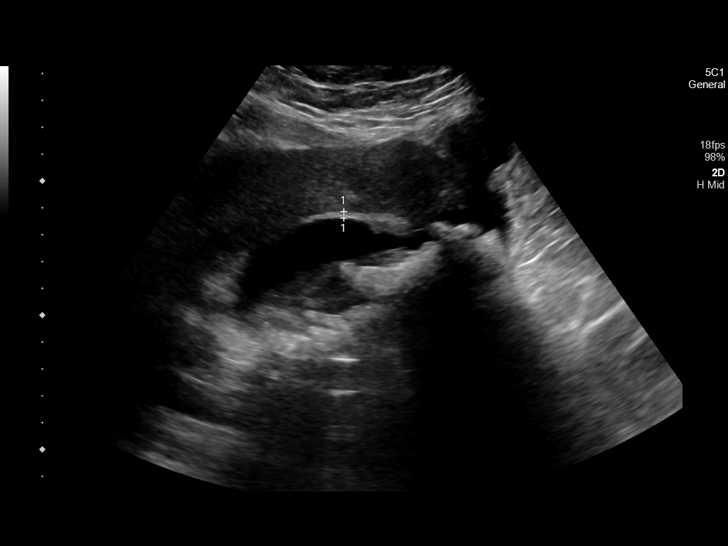
[im 9/54]
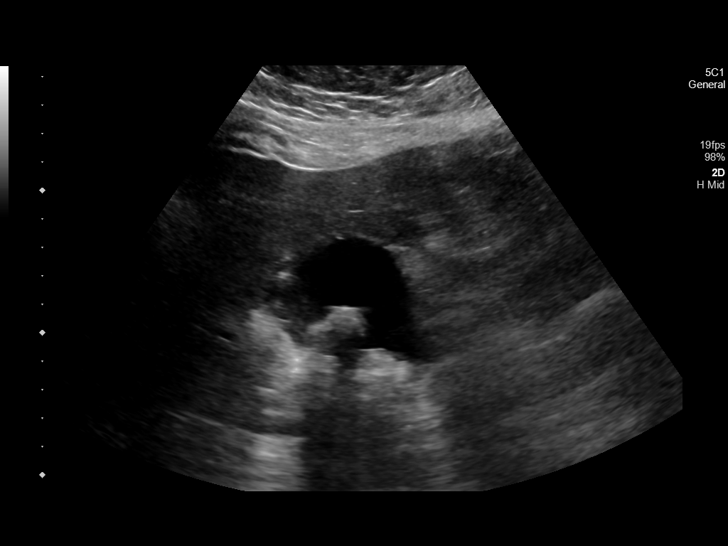
[im 14/54]
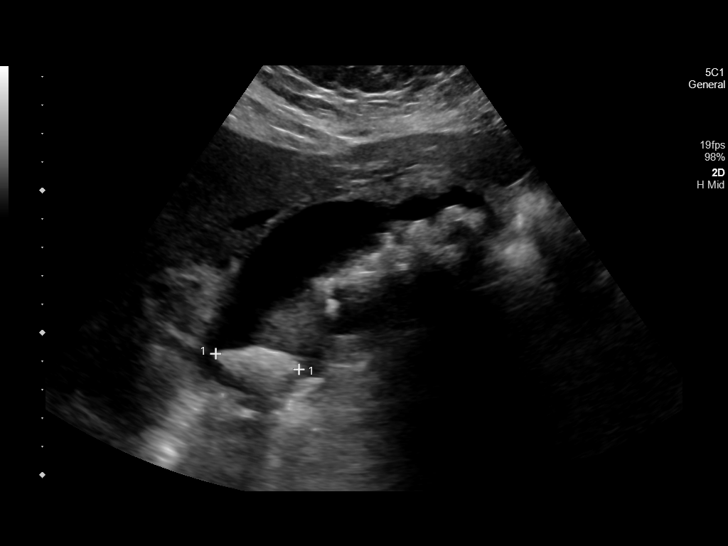
[im 18/54]
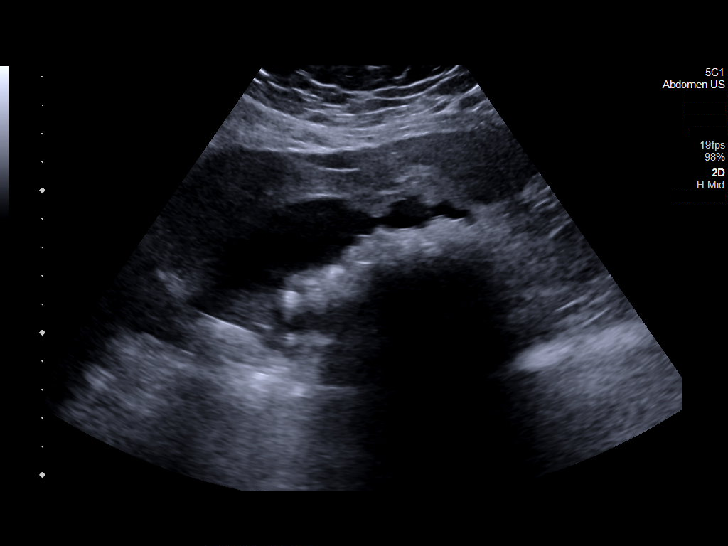
[im 20/54]
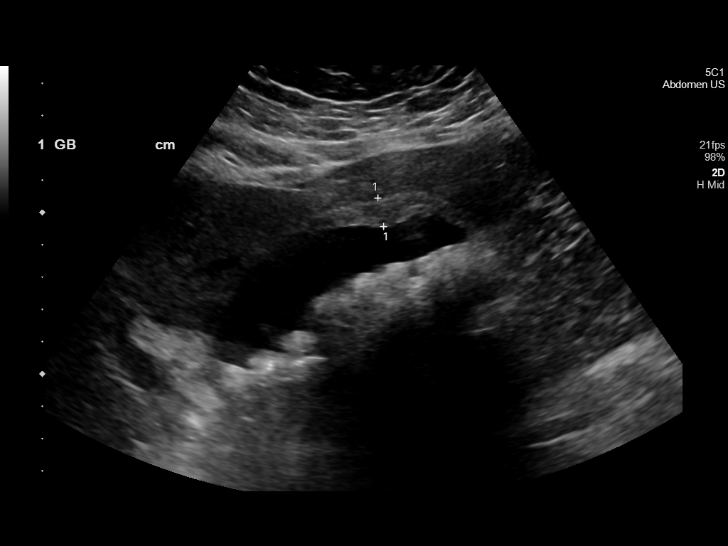
[im 25/54]
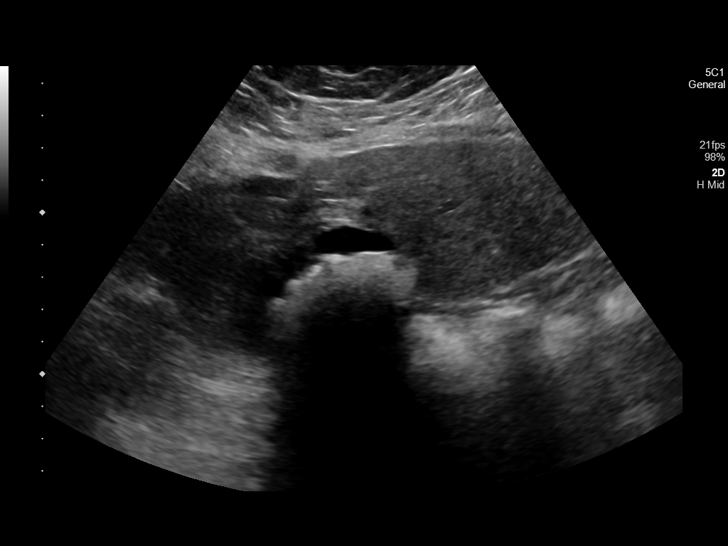
[im 29/54]
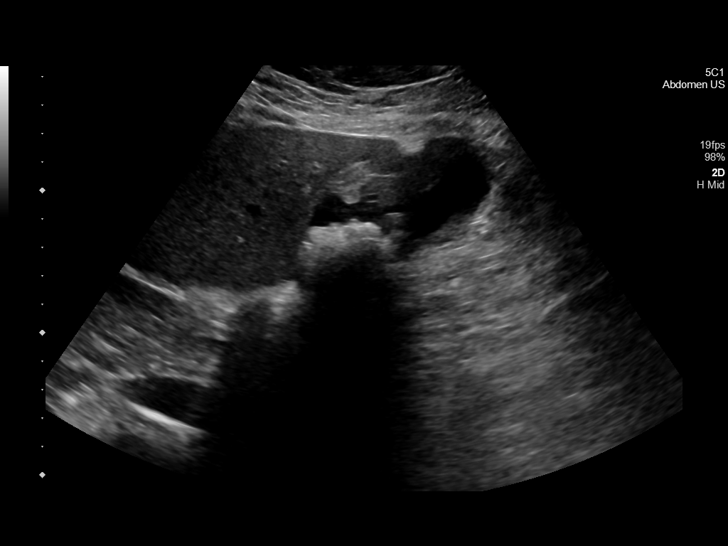
[im 34/54]
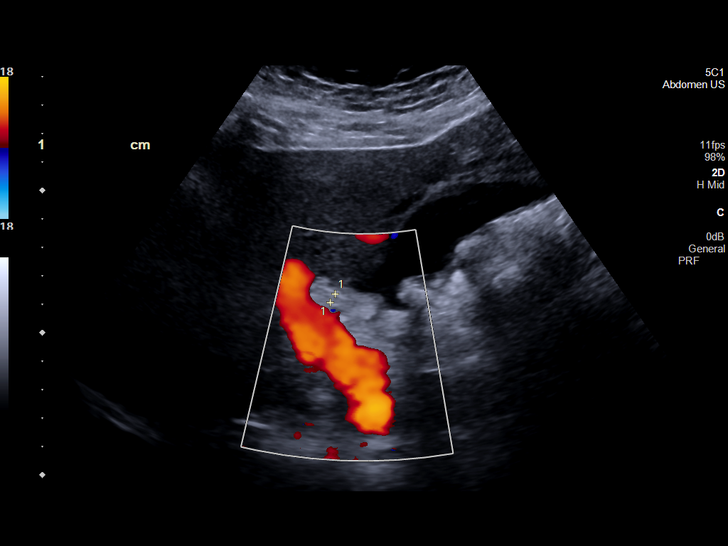
[im 36/54]
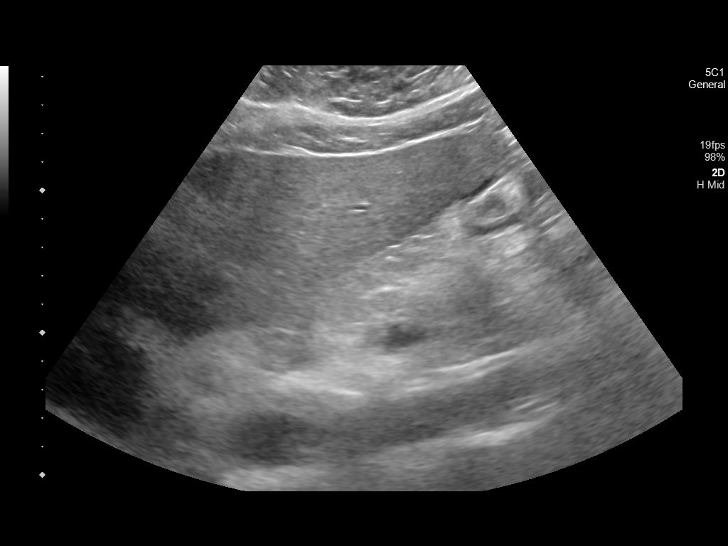
[im 40/54]
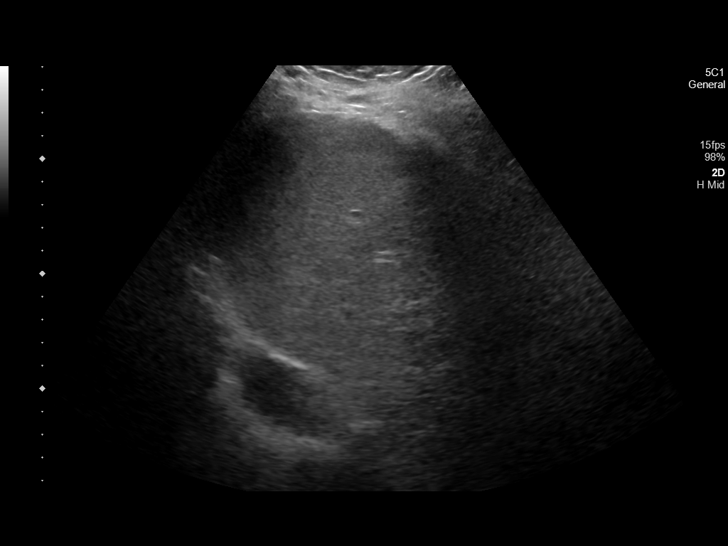
[im 45/54]
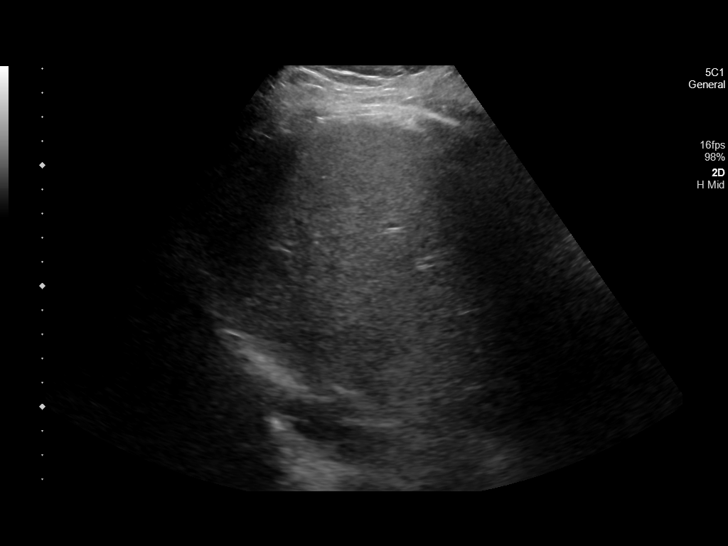
[im 49/54]
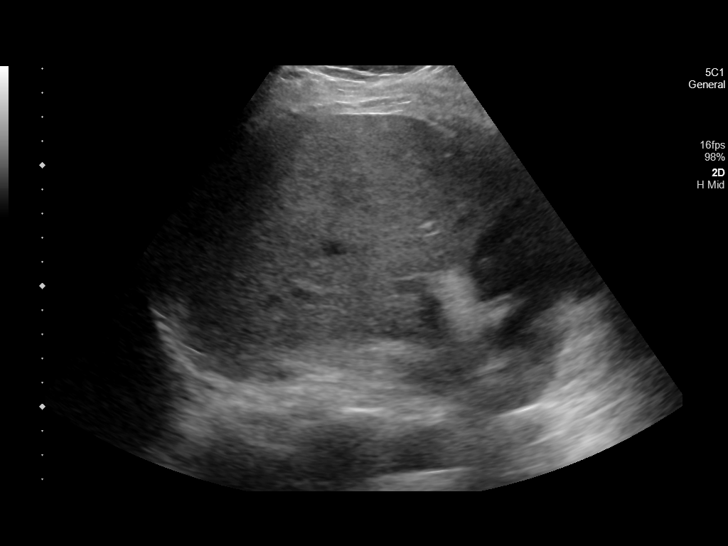
[im 54/54]
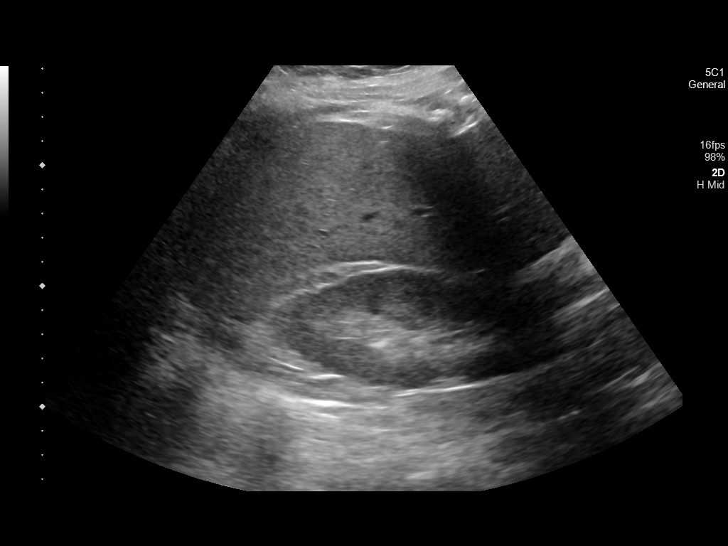

[14 of 25 positions shown; findings below may reference images not displayed]

FINDINGS: Gallbladder:

Multiple mobile echogenic gallstones are seen within the gallbladder
lumen (the largest measures approximately 3.0 cm). The gallbladder
wall measures 9 mm in thickness and is irregular in appearance. No
sonographic Murphy sign noted by sonographer, however, the patient
was heavily medicated at the time of the exam.

Common bile duct:

Diameter: 3.5 mm

Liver:

No focal lesion identified. There is mildly increased echogenicity
of the liver parenchyma. Portal vein is patent on color Doppler
imaging with normal direction of blood flow towards the liver.

Other: None.
IMPRESSION: 1. Findings likely consistent with acute cholecystitis, despite the
absence of a positive sonographic Murphy sign. Further correlation
with a nuclear medicine hepatobiliary scan is recommended.

## 2020-05-16 MED ORDER — INSULIN ASPART 100 UNIT/ML ~~LOC~~ SOLN
0.0000 [IU] | SUBCUTANEOUS | Status: DC
  Administered 2020-05-17: 3 [IU] via SUBCUTANEOUS
  Filled 2020-05-16: qty 1

## 2020-05-16 MED ORDER — ONDANSETRON HCL 4 MG/2ML IJ SOLN
4.0000 mg | Freq: Once | INTRAMUSCULAR | Status: AC
  Administered 2020-05-16: 4 mg via INTRAVENOUS
  Filled 2020-05-16: qty 2

## 2020-05-16 MED ORDER — LACTATED RINGERS IV SOLN: INTRAVENOUS | Status: DC

## 2020-05-16 MED ORDER — PIPERACILLIN-TAZOBACTAM 3.375 G IVPB 30 MIN
3.3750 g | Freq: Once | INTRAVENOUS | Status: AC
  Administered 2020-05-16: 3.375 g via INTRAVENOUS
  Filled 2020-05-16: qty 50

## 2020-05-16 MED ORDER — METOPROLOL TARTRATE 5 MG/5ML IV SOLN: 5.0000 mg | Freq: Four times a day (QID) | INTRAVENOUS | Status: DC | PRN

## 2020-05-16 MED ORDER — PIPERACILLIN-TAZOBACTAM 3.375 G IVPB
3.3750 g | Freq: Three times a day (TID) | INTRAVENOUS | Status: DC
  Administered 2020-05-17 – 2020-05-18 (×4): 3.375 g via INTRAVENOUS
  Filled 2020-05-16 (×3): qty 50

## 2020-05-16 MED ORDER — SIMETHICONE 80 MG PO CHEW
40.0000 mg | CHEWABLE_TABLET | Freq: Four times a day (QID) | ORAL | Status: DC | PRN
  Filled 2020-05-16: qty 1

## 2020-05-16 MED ORDER — DEXTROSE IN LACTATED RINGERS 5 % IV SOLN: INTRAVENOUS | Status: DC

## 2020-05-16 MED ORDER — ONDANSETRON 4 MG PO TBDP: 4.0000 mg | ORAL_TABLET | Freq: Four times a day (QID) | ORAL | Status: DC | PRN

## 2020-05-16 MED ORDER — ACETAMINOPHEN 500 MG PO TABS
1000.0000 mg | ORAL_TABLET | Freq: Four times a day (QID) | ORAL | Status: DC
  Administered 2020-05-17 – 2020-05-18 (×2): 1000 mg via ORAL
  Filled 2020-05-16 (×2): qty 2

## 2020-05-16 MED ORDER — HYDROMORPHONE HCL 1 MG/ML IJ SOLN
0.5000 mg | INTRAMUSCULAR | Status: AC
  Administered 2020-05-17: 0.5 mg via INTRAVENOUS
  Filled 2020-05-16: qty 1

## 2020-05-16 MED ORDER — LACTATED RINGERS IV BOLUS
1000.0000 mL | Freq: Once | INTRAVENOUS | Status: AC
  Administered 2020-05-16: 1000 mL via INTRAVENOUS

## 2020-05-16 MED ORDER — KETOROLAC TROMETHAMINE 15 MG/ML IJ SOLN
15.0000 mg | Freq: Four times a day (QID) | INTRAMUSCULAR | Status: DC | PRN
  Administered 2020-05-17: 15 mg via INTRAVENOUS
  Filled 2020-05-16 (×2): qty 1

## 2020-05-16 MED ORDER — KETOROLAC TROMETHAMINE 30 MG/ML IJ SOLN
15.0000 mg | INTRAMUSCULAR | Status: AC
  Administered 2020-05-16: 15 mg via INTRAVENOUS
  Filled 2020-05-16: qty 1

## 2020-05-16 MED ORDER — HYDRALAZINE HCL 20 MG/ML IJ SOLN: 10.0000 mg | Freq: Four times a day (QID) | INTRAMUSCULAR | Status: DC | PRN

## 2020-05-16 MED ORDER — ONDANSETRON HCL 4 MG/2ML IJ SOLN: 4.0000 mg | Freq: Four times a day (QID) | INTRAMUSCULAR | Status: DC | PRN

## 2020-05-16 MED ORDER — HYDROMORPHONE HCL 1 MG/ML IJ SOLN
0.5000 mg | INTRAMUSCULAR | Status: DC | PRN
  Administered 2020-05-17 (×2): 0.5 mg via INTRAVENOUS
  Filled 2020-05-16 (×2): qty 1

## 2020-05-16 NOTE — ED Triage Notes (Signed)
Pt to ED from home c/o RUQ pain radiating right flank, nausea with vomiting x3, denies diarrhea, CP, SOB.  Denies urinary changes.  Pt A&Ox4, chest rise even and unlabored, in NAD at this time.

## 2020-05-16 NOTE — ED Notes (Signed)
This rn initiated 20g int to right ac after two unsuccessful attempts by previous RN.

## 2020-05-16 NOTE — ED Provider Notes (Signed)
[facility] Emergency Department Provider Note  ____________________________________________  Time seen: Approximately 11:22 PM  I have reviewed the triage vital signs and the nursing notes.   HISTORY  Chief Complaint Abdominal Pain and Flank Pain    HPI Kristina Orozco is a 64 y.o. female with a history of hypertension diabetes who complains of right upper quadrant pain radiating to the back, nausea vomiting multiple times today. She reports eating breakfast this morning after restarting to vomit, unable to eat or drink anything throughout the rest of the day. Never had anything like this before. No fevers chills chest pain or shortness of breath. No constipation or diarrhea. No body aches. No sick contacts. Symptoms are constant, no alleviating factors, pain is 10/10.      Past Medical History:  Diagnosis Date  . Diabetes mellitus without complication ([place])   . Hypertension      There are no problems to display for this patient.    Past Surgical History:  Procedure Laterality Date  . ABDOMINAL HYSTERECTOMY    . APPENDECTOMY  2004  . BREAST BIOPSY Left 2011  . BREAST BIOPSY Right 08/26/13  . 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/28/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  . COLONOSCOPY WITH PROPOFOL N/A 01/31/2018   Procedure: COLONOSCOPY WITH PROPOFOL;  Surgeon: Jonathon Bellows, MD;  Location: [facility] ENDOSCOPY;  Service: Gastroenterology;  Laterality: N/A;     Prior to Admission medications   Medication Sig Start Date End Date Taking? Authorizing Provider  calcium acetate (PHOSLO) 667 MG capsule Take by mouth 3 (three) times daily with meals.    [provider]  hydrochlorothiazide (HYDRODIURIL) 25 MG tablet Take 25 mg by mouth daily.    [provider]  Multiple Vitamins-Minerals (MULTIVITAMIN WITH MINERALS) tablet Take 1 tablet by mouth daily.    [provider]     Allergies Patient has no known allergies.   Family History  Problem Relation Age of  Onset  . Pancreatic cancer Mother     Social History Social History   Tobacco Use  . Smoking status: Never Smoker  . Smokeless tobacco: Never Used  Substance Use Topics  . Alcohol use: Yes  . Drug use: No    Review of Systems  Constitutional:   No fever or chills.  ENT:   No sore throat. No rhinorrhea. Cardiovascular:   No chest pain or syncope. Respiratory:   No dyspnea or cough. Gastrointestinal: Positive right upper quadrant abdominal pain and vomiting as above Musculoskeletal:   Negative for focal pain or swelling All other systems reviewed and are negative except as documented above in ROS and HPI.  ____________________________________________   PHYSICAL EXAM:  VITAL SIGNS: ED Triage Vitals  Enc Vitals Group     BP 05/16/20 1955 (!) 173/87     Pulse Rate 05/16/20 1955 95     Resp 05/16/20 1955 14     Temp 05/16/20 1955 99.1 F (37.3 C)     Temp Source 05/16/20 1955 Oral     SpO2 05/16/20 1955 98 %     Weight 05/16/20 1952 219 lb (99.3 kg)     Height 05/16/20 1952 5\' 6"  (1.676 m)     Head Circumference --      Peak Flow --      Pain Score 05/16/20 1951 8     Pain Loc --      Pain Edu? --      Excl. in [place]? --     Vital signs reviewed,  nursing assessments reviewed.   Constitutional:   Alert and oriented. Non-toxic appearance. Eyes:   Conjunctivae are normal. EOMI. PERRL. ENT      Head:   Normocephalic and atraumatic.      Nose:   Wearing a mask.      Mouth/Throat:   Wearing a mask.      Neck:   No meningismus. Full ROM. Hematological/Lymphatic/Immunilogical:   No cervical lymphadenopathy. Cardiovascular:   RRR. Symmetric bilateral radial and DP pulses.  No murmurs. Cap refill less than 2 seconds. Respiratory:   Normal respiratory effort without tachypnea/retractions. Breath sounds are clear and equal bilaterally. No wheezes/rales/rhonchi. Gastrointestinal:   Soft with right upper quadrant tenderness and fullness and feeling of mass on palpation of the  right upper quadrant in the area of the gallbladder. Abdomen is Non distended. There is no CVA tenderness. No rebound, rigidity, or guarding.  Musculoskeletal:   Normal range of motion in all extremities. No joint effusions.  No lower extremity tenderness.  No edema. Neurologic:   Normal speech and language.  Motor grossly intact. No acute focal neurologic deficits are appreciated.  Skin:    Skin is warm, dry and intact. No rash noted.  No petechiae, purpura, or bullae.  ____________________________________________    LABS (pertinent positives/negatives) (all labs ordered are listed, but only abnormal results are displayed) Labs Reviewed  COMPREHENSIVE METABOLIC PANEL - Abnormal; Notable for the following components:      Result Value   Potassium 3.3 (*)    Chloride 94 (*)    Glucose, Bld 143 (*)    Total Protein 8.4 (*)    All other components within normal limits  CBC - Abnormal; Notable for the following components:   WBC 12.9 (*)    All other components within normal limits  URINALYSIS, COMPLETE (UACMP) WITH MICROSCOPIC - Abnormal; Notable for the following components:   Color, Urine YELLOW (*)    APPearance CLOUDY (*)    Hgb urine dipstick MODERATE (*)    Ketones, ur 20 (*)    Protein, ur >=300 (*)    Leukocytes,Ua SMALL (*)    RBC / HPF >50 (*)    Bacteria, UA RARE (*)    All other components within normal limits  RESPIRATORY PANEL BY RT PCR (FLU A&B, COVID)  LIPASE, BLOOD   ____________________________________________   EKG  Interpreted by me Normal sinus rhythm rate of 93, normal axis and intervals. Normal QRS ST segments and T waves.  ____________________________________________    RADIOLOGY  US ABDOMEN LIMITED RUQ (LIVER/GB)  Result Date: 05/16/2020 CLINICAL DATA:  Right upper quadrant pain and vomiting. EXAM: ULTRASOUND ABDOMEN LIMITED RIGHT UPPER QUADRANT COMPARISON:  None. FINDINGS: Gallbladder: Multiple mobile echogenic gallstones are seen within  the gallbladder lumen (the largest measures approximately 3.0 cm). The gallbladder wall measures 9 mm in thickness and is irregular in appearance. No sonographic Murphy sign noted by sonographer, however, the patient was heavily medicated at the time of the exam. Common bile duct: Diameter: 3.5 mm Liver: No focal lesion identified. There is mildly increased echogenicity of the liver parenchyma. Portal vein is patent on color Doppler imaging with normal direction of blood flow towards the liver. Other: None. IMPRESSION: 1. Findings likely consistent with acute cholecystitis, despite the absence of a positive sonographic Murphy sign. Further correlation with a nuclear medicine hepatobiliary scan is recommended. Electronically Signed   By: Virgina Norfolk M.D.   On: 05/16/2020 22:54    ____________________________________________   PROCEDURES Procedures  ____________________________________________  DIFFERENTIAL DIAGNOSIS   Cholecystitis, diverticulitis, colonic mass, bowel obstruction, hernia  CLINICAL IMPRESSION / ASSESSMENT AND PLAN / ED COURSE  Medications ordered in the ED: Medications  piperacillin-tazobactam (ZOSYN) IVPB 3.375 g (has no administration in time range)  lactated ringers infusion (has no administration in time range)  ketorolac (TORADOL) 30 MG/ML injection 15 mg (15 mg Intravenous Given 05/16/20 2152)  lactated ringers bolus 1,000 mL (1,000 mLs Intravenous New Bag/Given 05/16/20 2154)  ondansetron (ZOFRAN) injection 4 mg (4 mg Intravenous Given 05/16/20 2150)    Pertinent labs & imaging results that were available during my care of the patient were reviewed by me and considered in my medical decision making (see chart for details).  Charika Mikelson Costa Orozco was evaluated in Emergency Department on 05/16/2020 for the symptoms described in the history of present illness. She was evaluated in the context of the global COVID-19 pandemic, which necessitated consideration that the  patient might be at risk for infection with the SARS-CoV-2 virus that causes COVID-19. Institutional protocols and algorithms that pertain to the evaluation of patients at risk for COVID-19 are in a state of rapid change based on information released by regulatory bodies including the CDC and federal and state organizations. These policies and algorithms were followed during the patient's care in the ED.   Patient presents with right upper quadrant abdominal pain and vomiting, most suspicious for cholecystitis. Will obtain ultrasound, if negative would proceed with CT scan  Clinical Course as of May 16 2321  Sun May 16, 2020  2242 [place] images viewed by me, show innumerable gallstones. There is GB wall thickening concerning for cholecystitis   [PS]    Clinical Course User Index [PS] Carrie Mew, MD     ----------------------------------------- 11:24 PM on 05/16/2020 -----------------------------------------  Received call from radiology discussing ultrasound findings, confirming acute cholecystitis. Surgery Dr. Celine Ahr contacted for further management. Will order Zosyn for now. Patient still having pain after Toradol, still tender on exam, will give low-dose Dilaudid.  ____________________________________________   FINAL CLINICAL IMPRESSION(S) / ED DIAGNOSES    Final diagnoses:  Acute cholecystitis  Right upper quadrant abdominal pain     ED Discharge Orders    None      Portions of this note were generated with dragon dictation software. Dictation errors may occur despite best attempts at proofreading.   Carrie Mew, MD 05/16/20 2325

## 2020-05-16 NOTE — ED Notes (Signed)
Pt states "I still have a little bit of pain, can I have more medicine?" dr. Joni Fears notified.

## 2020-05-16 NOTE — Consult Note (Signed)
Reason for Consult:acute cholecystitis Referring Physician: Carrie Mew, MD (emergency medicine)  Kristina Orozco [place] is an 64 y.o. female.  HPI: *She presented to the emergency department today with right upper quadrant pain that began this morning.  It persisted throughout the day despite efforts to alleviate the pain.  She had an egg and [place] sausage for breakfast which she then vomited up.  She has had multiple episodes of nonbilious nonbloody emesis.  The pain is in her right upper quadrant and radiates to her back.  She has never had similar pain in the past.  She denies any fevers or chills.  No diarrhea.  She has never had jaundice, pancreatitis, or other symptoms suggestive of biliary colic.  Work-up in the emergency department demonstrated an elevated white blood count of 12.9, normal transaminases, normal lipase, and the right upper quadrant ultrasound was consistent with acute cholecystitis.  General surgery has been consulted in this context for further evaluation and management.  Past Medical History:  Diagnosis Date  . Diabetes mellitus without complication ([place])   . Hypertension     Past Surgical History:  Procedure Laterality Date  . ABDOMINAL HYSTERECTOMY    . APPENDECTOMY  2004  . BREAST BIOPSY Left 2011  . BREAST BIOPSY Right 08/26/13  . 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/28/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  . COLONOSCOPY WITH PROPOFOL N/A 01/31/2018   Procedure: COLONOSCOPY WITH PROPOFOL;  Surgeon: Jonathon Bellows, MD;  Location: [facility] ENDOSCOPY;  Service: Gastroenterology;  Laterality: N/A;    Family History  Problem Relation Age of Onset  . Pancreatic cancer Mother     Social History:  reports that she has never smoked. She has never used smokeless tobacco. She reports current alcohol use. She reports that she does not use drugs.  Allergies: No Known Allergies  Medications: I have reviewed the patient's current medications.  Results for orders placed or performed during the hospital  encounter of 05/16/20 (from the past 48 hour(s))  Lipase, blood     Status: None   Collection Time: 05/16/20  7:59 PM  Result Value Ref Range   Lipase 37 11 - 51 U/L    Comment: Performed at [facility], [address] [place], [place] [address]  Comprehensive metabolic panel     Status: Abnormal   Collection Time: 05/16/20  7:59 PM  Result Value Ref Range   Sodium 137 135 - 145 mmol/L   Potassium 3.3 (L) 3.5 - 5.1 mmol/L   Chloride 94 (L) 98 - 111 mmol/L   CO2 29 22 - 32 mmol/L   Glucose, Bld 143 (H) 70 - 99 mg/dL    Comment: Glucose reference range applies only to samples taken after fasting for at least 8 hours.   BUN 11 8 - 23 mg/dL   Creatinine, Ser 0.60 0.44 - 1.00 mg/dL   Calcium 9.7 8.9 - 10.3 mg/dL   Total Protein 8.4 (H) 6.5 - 8.1 g/dL   Albumin 4.4 3.5 - 5.0 g/dL   AST 27 15 - 41 U/L   ALT 19 0 - 44 U/L   Alkaline Phosphatase 55 38 - 126 U/L   Total Bilirubin 0.9 0.3 - 1.2 mg/dL   GFR, Estimated >60 >60 mL/min    Comment: (NOTE) Calculated using the CKD-EPI Creatinine Equation (2021)    Anion gap 14 5 - 15    Comment: Performed at [facility], [address] [place], [place] [address]  CBC     Status: Abnormal  Collection Time: 05/16/20  7:59 PM  Result Value Ref Range   WBC 12.9 (H) 4.0 - 10.5 K/uL   RBC 4.91 3.87 - 5.11 MIL/uL   Hemoglobin 13.6 12.0 - 15.0 g/dL   HCT 41.6 36 - 46 %   MCV 84.7 80.0 - 100.0 fL   MCH 27.7 26.0 - 34.0 pg   MCHC 32.7 30.0 - 36.0 g/dL   RDW 13.9 11.5 - 15.5 %   Platelets 302 150 - 400 K/uL   nRBC 0.0 0.0 - 0.2 %    Comment: Performed at [facility], [address] [place], [place] [address]  Urinalysis, Complete w Microscopic     Status: Abnormal   Collection Time: 05/16/20  7:59 PM  Result Value Ref Range   Color, Urine YELLOW (A) YELLOW   APPearance CLOUDY (A) CLEAR   Specific Gravity, Urine 1.024 1.005 - 1.030   pH 6.0 5.0 - 8.0   Glucose, UA NEGATIVE NEGATIVE mg/dL   Hgb urine  dipstick MODERATE (A) NEGATIVE   Bilirubin Urine NEGATIVE NEGATIVE   Ketones, ur 20 (A) NEGATIVE mg/dL   Protein, ur >=300 (A) NEGATIVE mg/dL   Nitrite NEGATIVE NEGATIVE   Leukocytes,Ua SMALL (A) NEGATIVE   RBC / HPF >50 (H) 0 - 5 RBC/hpf   WBC, UA 11-20 0 - 5 WBC/hpf   Bacteria, UA RARE (A) NONE SEEN   Squamous Epithelial / LPF 6-10 0 - 5   Mucus PRESENT     Comment: Performed at [facility], [address] [place], [place] [address]    US ABDOMEN LIMITED RUQ (LIVER/GB)  Result Date: 05/16/2020 CLINICAL DATA:  Right upper quadrant pain and vomiting. EXAM: ULTRASOUND ABDOMEN LIMITED RIGHT UPPER QUADRANT COMPARISON:  None. FINDINGS: Gallbladder: Multiple mobile echogenic gallstones are seen within the gallbladder lumen (the largest measures approximately 3.0 cm). The gallbladder wall measures 9 mm in thickness and is irregular in appearance. No sonographic Murphy sign noted by sonographer, however, the patient was heavily medicated at the time of the exam. Common bile duct: Diameter: 3.5 mm Liver: No focal lesion identified. There is mildly increased echogenicity of the liver parenchyma. Portal vein is patent on color Doppler imaging with normal direction of blood flow towards the liver. Other: None. IMPRESSION: 1. Findings likely consistent with acute cholecystitis, despite the absence of a positive sonographic Murphy sign. Further correlation with a nuclear medicine hepatobiliary scan is recommended. Electronically Signed   By: Virgina Norfolk M.D.   On: 05/16/2020 22:54    Review of Systems  All other systems reviewed and are negative. Or as discussed in the history of present illness  Blood pressure (!) 173/87, pulse 95, temperature 99.1 F (37.3 C), temperature source Oral, resp. rate 14, height 5\' 6"  (1.676 m), weight 99.3 kg, SpO2 98 %. Physical Exam Constitutional:      General: She is not in acute distress.    Appearance: She is well-developed. She is obese.  HENT:      Head: Normocephalic.  Eyes:     General: No scleral icterus. Cardiovascular:     Rate and Rhythm: Normal rate and regular rhythm.  Pulmonary:     Effort: Pulmonary effort is normal. No respiratory distress.  Abdominal:     General: Abdomen is protuberant. There is no distension.     Palpations: Abdomen is soft.     Tenderness: There is abdominal tenderness in the right upper quadrant and epigastric area. Positive signs include Murphy's sign.  Genitourinary:  Comments: Deferred Skin:    General: Skin is warm and dry.  Neurological:     General: No focal deficit present.     Mental Status: She is alert.  Psychiatric:        Mood and Affect: Mood normal.        Behavior: Behavior normal.     Assessment/Plan: This is a 64 year old woman presenting to the emergency department with abdominal pain.  Evaluation is most consistent with acute cholecystitis.  I have recommended that she undergo laparoscopic appendectomy. I discussed the procedure in detail.  We discussed the risks and benefits of a laparoscopic cholecystectomy and possible cholangiogram including, but not limited to: bleeding, infection, injury to surrounding structures such as the intestine or liver, bile leak, retained gallstones, need to convert to an open procedure, prolonged diarrhea, blood clots such as DVT, common bile duct injury, anesthesia risks, and possible need for additional procedures. The patient had the opportunity to ask any questions and these were answered to her satisfaction.  We will plan on performing her operation tomorrow, pending ORN anesthesia availability.  I will admit her to the hospital for IV fluid resuscitation and IV antibiotics in the interim.  She will remain n.p.o..  Fredirick Maudlin 05/16/2020, 11:48 PM

## 2020-05-17 ENCOUNTER — Observation Stay: Payer: Medicare Other | Admitting: Certified Registered Nurse Anesthetist

## 2020-05-17 ENCOUNTER — Encounter: Payer: Self-pay | Admitting: General Surgery

## 2020-05-17 ENCOUNTER — Encounter
Admission: EM | Disposition: A | Discharge status: Home / Self Care | Payer: Self-pay | Source: Home / Self Care | Attending: General Surgery

## 2020-05-17 DIAGNOSIS — C801 Malignant (primary) neoplasm, unspecified: Secondary | ICD-10-CM

## 2020-05-17 DIAGNOSIS — K8000 Calculus of gallbladder with acute cholecystitis without obstruction: Secondary | ICD-10-CM

## 2020-05-17 DIAGNOSIS — K660 Peritoneal adhesions (postprocedural) (postinfection): Secondary | ICD-10-CM | POA: Diagnosis present

## 2020-05-17 DIAGNOSIS — E669 Obesity, unspecified: Secondary | ICD-10-CM | POA: Diagnosis present

## 2020-05-17 DIAGNOSIS — Z800 Family history of malignant neoplasm of digestive organs: Secondary | ICD-10-CM | POA: Diagnosis not present

## 2020-05-17 DIAGNOSIS — E119 Type 2 diabetes mellitus without complications: Secondary | ICD-10-CM | POA: Diagnosis present

## 2020-05-17 DIAGNOSIS — C23 Malignant neoplasm of gallbladder: Secondary | ICD-10-CM | POA: Diagnosis present

## 2020-05-17 DIAGNOSIS — Z20822 Contact with and (suspected) exposure to covid-19: Secondary | ICD-10-CM | POA: Diagnosis present

## 2020-05-17 DIAGNOSIS — E876 Hypokalemia: Secondary | ICD-10-CM | POA: Diagnosis present

## 2020-05-17 DIAGNOSIS — Z9049 Acquired absence of other specified parts of digestive tract: Secondary | ICD-10-CM

## 2020-05-17 DIAGNOSIS — C771 Secondary and unspecified malignant neoplasm of intrathoracic lymph nodes: Secondary | ICD-10-CM | POA: Diagnosis present

## 2020-05-17 DIAGNOSIS — K429 Umbilical hernia without obstruction or gangrene: Secondary | ICD-10-CM | POA: Diagnosis present

## 2020-05-17 DIAGNOSIS — K8012 Calculus of gallbladder with acute and chronic cholecystitis without obstruction: Secondary | ICD-10-CM | POA: Diagnosis present

## 2020-05-17 DIAGNOSIS — I10 Essential (primary) hypertension: Secondary | ICD-10-CM | POA: Diagnosis present

## 2020-05-17 DIAGNOSIS — Z6835 Body mass index (BMI) 35.0-35.9, adult: Secondary | ICD-10-CM | POA: Diagnosis not present

## 2020-05-17 DIAGNOSIS — K810 Acute cholecystitis: Secondary | ICD-10-CM | POA: Diagnosis present

## 2020-05-17 HISTORY — PX: CHOLECYSTECTOMY: SHX55

## 2020-05-17 HISTORY — DX: Malignant (primary) neoplasm, unspecified: C80.1

## 2020-05-17 LAB — CBC
HCT: 39 % (ref 36.0–46.0)
Hemoglobin: 12.9 g/dL (ref 12.0–15.0)
MCH: 27.9 pg (ref 26.0–34.0)
MCHC: 33.1 g/dL (ref 30.0–36.0)
MCV: 84.2 fL (ref 80.0–100.0)
Platelets: 282 10*3/uL (ref 150–400)
RBC: 4.63 MIL/uL (ref 3.87–5.11)
RDW: 14 % (ref 11.5–15.5)
WBC: 14.6 10*3/uL — ABNORMAL HIGH (ref 4.0–10.5)
nRBC: 0 % (ref 0.0–0.2)

## 2020-05-17 LAB — HEMOGLOBIN A1C
Hgb A1c MFr Bld: 6.5 % — ABNORMAL HIGH (ref 4.8–5.6)
Mean Plasma Glucose: 140 mg/dL

## 2020-05-17 LAB — COMPREHENSIVE METABOLIC PANEL
ALT: 17 U/L (ref 0–44)
AST: 24 U/L (ref 15–41)
Albumin: 4.1 g/dL (ref 3.5–5.0)
Alkaline Phosphatase: 48 U/L (ref 38–126)
Anion gap: 11 (ref 5–15)
BUN: 11 mg/dL (ref 8–23)
CO2: 30 mmol/L (ref 22–32)
Calcium: 9.2 mg/dL (ref 8.9–10.3)
Chloride: 97 mmol/L — ABNORMAL LOW (ref 98–111)
Creatinine, Ser: 0.68 mg/dL (ref 0.44–1.00)
GFR, Estimated: >60 mL/min (ref >60)
Glucose, Bld: 141 mg/dL — ABNORMAL HIGH (ref 70–99)
Potassium: 3.2 mmol/L — ABNORMAL LOW (ref 3.5–5.1)
Sodium: 138 mmol/L (ref 135–145)
Total Bilirubin: 1.1 mg/dL (ref 0.3–1.2)
Total Protein: 7.5 g/dL (ref 6.5–8.1)

## 2020-05-17 LAB — MAGNESIUM: Magnesium: 2 mg/dL (ref 1.7–2.4)

## 2020-05-17 LAB — PHOSPHORUS: Phosphorus: 5.1 mg/dL — ABNORMAL HIGH (ref 2.5–4.6)

## 2020-05-17 LAB — RESPIRATORY PANEL BY RT PCR (FLU A&B, COVID)
Influenza A by PCR: NEGATIVE
Influenza B by PCR: NEGATIVE
SARS Coronavirus 2 by RT PCR: NEGATIVE

## 2020-05-17 LAB — GLUCOSE, CAPILLARY
Glucose-Capillary: 126 mg/dL — ABNORMAL HIGH (ref 70–99)
Glucose-Capillary: 167 mg/dL — ABNORMAL HIGH (ref 70–99)
Glucose-Capillary: 233 mg/dL — ABNORMAL HIGH (ref 70–99)

## 2020-05-17 LAB — HIV ANTIBODY (ROUTINE TESTING W REFLEX): HIV Screen 4th Generation wRfx: NONREACTIVE

## 2020-05-17 SURGERY — LAPAROSCOPIC CHOLECYSTECTOMY
Anesthesia: General | Site: Abdomen

## 2020-05-17 MED ORDER — FENTANYL CITRATE (PF) 100 MCG/2ML IJ SOLN
INTRAMUSCULAR | Status: AC
  Filled 2020-05-17: qty 2

## 2020-05-17 MED ORDER — LIDOCAINE-EPINEPHRINE 1 %-1:100000 IJ SOLN
INTRAMUSCULAR | Status: AC
  Filled 2020-05-17: qty 1

## 2020-05-17 MED ORDER — BUPIVACAINE HCL (PF) 0.25 % IJ SOLN
INTRAMUSCULAR | Status: AC
  Filled 2020-05-17: qty 30

## 2020-05-17 MED ORDER — PIPERACILLIN-TAZOBACTAM 3.375 G IVPB
INTRAVENOUS | Status: AC
  Filled 2020-05-17: qty 50

## 2020-05-17 MED ORDER — LIDOCAINE-EPINEPHRINE 1 %-1:100000 IJ SOLN
INTRAMUSCULAR | Status: DC | PRN
  Administered 2020-05-17: 35 mL

## 2020-05-17 MED ORDER — SUGAMMADEX SODIUM 200 MG/2ML IV SOLN
INTRAVENOUS | Status: DC | PRN
  Administered 2020-05-17: 250 mg via INTRAVENOUS

## 2020-05-17 MED ORDER — LIDOCAINE HCL (PF) 2 % IJ SOLN
INTRAMUSCULAR | Status: AC
  Filled 2020-05-17: qty 5

## 2020-05-17 MED ORDER — MIDAZOLAM HCL 2 MG/2ML IJ SOLN
INTRAMUSCULAR | Status: AC
  Filled 2020-05-17: qty 2

## 2020-05-17 MED ORDER — EPHEDRINE 5 MG/ML INJ
INTRAVENOUS | Status: AC
  Filled 2020-05-17: qty 10

## 2020-05-17 MED ORDER — ONDANSETRON HCL 4 MG/2ML IJ SOLN
INTRAMUSCULAR | Status: DC | PRN
  Administered 2020-05-17: 4 mg via INTRAVENOUS

## 2020-05-17 MED ORDER — ROCURONIUM BROMIDE 10 MG/ML (PF) SYRINGE
PREFILLED_SYRINGE | INTRAVENOUS | Status: AC
  Filled 2020-05-17: qty 10

## 2020-05-17 MED ORDER — ACETAMINOPHEN 10 MG/ML IV SOLN
INTRAVENOUS | Status: AC
  Filled 2020-05-17: qty 100

## 2020-05-17 MED ORDER — OXYCODONE HCL 5 MG PO TABS
5.0000 mg | ORAL_TABLET | ORAL | Status: DC | PRN
  Administered 2020-05-17: 5 mg via ORAL

## 2020-05-17 MED ORDER — FENTANYL CITRATE (PF) 100 MCG/2ML IJ SOLN: 25.0000 ug | INTRAMUSCULAR | Status: DC | PRN

## 2020-05-17 MED ORDER — OXYCODONE HCL 5 MG PO TABS
ORAL_TABLET | ORAL | Status: AC
  Administered 2020-05-17: 5 mg via ORAL
  Filled 2020-05-17: qty 1

## 2020-05-17 MED ORDER — VISTASEAL 10 ML SINGLE DOSE KIT
PACK | CUTANEOUS | Status: AC
  Filled 2020-05-17: qty 10

## 2020-05-17 MED ORDER — VASOPRESSIN 20 UNIT/ML IV SOLN
INTRAVENOUS | Status: AC
  Filled 2020-05-17: qty 1

## 2020-05-17 MED ORDER — DEXAMETHASONE SODIUM PHOSPHATE 10 MG/ML IJ SOLN
INTRAMUSCULAR | Status: DC | PRN
  Administered 2020-05-17: 10 mg via INTRAVENOUS

## 2020-05-17 MED ORDER — PROPOFOL 10 MG/ML IV BOLUS
INTRAVENOUS | Status: AC
  Filled 2020-05-17: qty 20

## 2020-05-17 MED ORDER — DEXAMETHASONE SODIUM PHOSPHATE 10 MG/ML IJ SOLN
INTRAMUSCULAR | Status: AC
  Filled 2020-05-17: qty 1

## 2020-05-17 MED ORDER — PHENYLEPHRINE HCL (PRESSORS) 10 MG/ML IV SOLN
INTRAVENOUS | Status: DC | PRN
  Administered 2020-05-17 (×3): 200 ug via INTRAVENOUS

## 2020-05-17 MED ORDER — VASOPRESSIN 20 UNIT/ML IV SOLN
INTRAVENOUS | Status: DC | PRN
  Administered 2020-05-17 (×2): 2 [IU] via INTRAVENOUS

## 2020-05-17 MED ORDER — MIDAZOLAM HCL 2 MG/2ML IJ SOLN
INTRAMUSCULAR | Status: DC | PRN
  Administered 2020-05-17: 2 mg via INTRAVENOUS

## 2020-05-17 MED ORDER — LIDOCAINE HCL (CARDIAC) PF 100 MG/5ML IV SOSY
PREFILLED_SYRINGE | INTRAVENOUS | Status: DC | PRN
  Administered 2020-05-17: 100 mg via INTRAVENOUS

## 2020-05-17 MED ORDER — ONDANSETRON HCL 4 MG/2ML IJ SOLN
INTRAMUSCULAR | Status: AC
  Filled 2020-05-17: qty 2

## 2020-05-17 MED ORDER — EPHEDRINE SULFATE 50 MG/ML IJ SOLN
INTRAMUSCULAR | Status: DC | PRN
  Administered 2020-05-17: 10 mg via INTRAVENOUS

## 2020-05-17 MED ORDER — ROCURONIUM BROMIDE 100 MG/10ML IV SOLN
INTRAVENOUS | Status: DC | PRN
  Administered 2020-05-17: 50 mg via INTRAVENOUS
  Administered 2020-05-17: 20 mg via INTRAVENOUS
  Administered 2020-05-17: 10 mg via INTRAVENOUS

## 2020-05-17 MED ORDER — ONDANSETRON HCL 4 MG/2ML IJ SOLN: 4.0000 mg | Freq: Once | INTRAMUSCULAR | Status: DC | PRN

## 2020-05-17 MED ORDER — FENTANYL CITRATE (PF) 100 MCG/2ML IJ SOLN
INTRAMUSCULAR | Status: DC | PRN
  Administered 2020-05-17 (×3): 50 ug via INTRAVENOUS

## 2020-05-17 MED ORDER — PROPOFOL 10 MG/ML IV BOLUS
INTRAVENOUS | Status: DC | PRN
  Administered 2020-05-17: 200 mg via INTRAVENOUS

## 2020-05-17 MED ORDER — HYDROCHLOROTHIAZIDE 25 MG PO TABS
25.0000 mg | ORAL_TABLET | Freq: Every day | ORAL | Status: DC
  Administered 2020-05-18: 25 mg via ORAL
  Filled 2020-05-17: qty 1

## 2020-05-17 MED ORDER — SODIUM CHLORIDE 0.9 % IV SOLN: INTRAVENOUS | Status: DC | PRN

## 2020-05-17 MED ORDER — ACETAMINOPHEN 10 MG/ML IV SOLN
INTRAVENOUS | Status: DC | PRN
  Administered 2020-05-17: 1000 mg via INTRAVENOUS

## 2020-05-17 MED ORDER — VISTASEAL 10 ML SINGLE DOSE KIT
PACK | CUTANEOUS | Status: DC | PRN
  Administered 2020-05-17: 10 mL via TOPICAL

## 2020-05-17 SURGICAL SUPPLY — 53 items
APPLICATOR ARISTA FLEXITIP XL (MISCELLANEOUS) ×2 IMPLANT
APPLICATOR VISTASEAL 35 (MISCELLANEOUS) ×2 IMPLANT
APPLIER CLIP 5 13 M/L LIGAMAX5 (MISCELLANEOUS) ×2
BLADE CLIPPER SURG (BLADE) ×2 IMPLANT
BLADE SURG SZ11 CARB STEEL (BLADE) ×2 IMPLANT
CANISTER SUCT 1200ML W/VALVE (MISCELLANEOUS) IMPLANT
CATH CHOLANGI 4FR 420404F (CATHETERS) IMPLANT
CHLORAPREP W/TINT 26 (MISCELLANEOUS) ×2 IMPLANT
CLIP APPLIE 5 13 M/L LIGAMAX5 (MISCELLANEOUS) ×1 IMPLANT
COVER WAND RF STERILE (DRAPES) ×2 IMPLANT
DECANTER SPIKE VIAL GLASS SM (MISCELLANEOUS) IMPLANT
DEFOGGER SCOPE WARMER CLEARIFY (MISCELLANEOUS) ×2 IMPLANT
DERMABOND ADVANCED (GAUZE/BANDAGES/DRESSINGS) ×1
DERMABOND ADVANCED .7 DNX12 (GAUZE/BANDAGES/DRESSINGS) ×1 IMPLANT
ELECT CAUTERY BLADE TIP 2.5 (TIP) ×2
ELECT REM PT RETURN 9FT ADLT (ELECTROSURGICAL) ×2
ELECTRODE CAUTERY BLDE TIP 2.5 (TIP) ×1 IMPLANT
ELECTRODE REM PT RTRN 9FT ADLT (ELECTROSURGICAL) ×1 IMPLANT
GLOVE BIO SURGEON STRL SZ 6.5 (GLOVE) ×2 IMPLANT
GLOVE INDICATOR 7.0 STRL GRN (GLOVE) ×2 IMPLANT
GOWN STRL REUS W/ TWL LRG LVL3 (GOWN DISPOSABLE) ×3 IMPLANT
GOWN STRL REUS W/TWL LRG LVL3 (GOWN DISPOSABLE) ×3
GRASPER SUT TROCAR 14GX15 (MISCELLANEOUS) ×2 IMPLANT
HEMOSTAT ARISTA ABSORB 1G (HEMOSTASIS) ×2 IMPLANT
IRRIGATION STRYKERFLOW (MISCELLANEOUS) ×1 IMPLANT
IRRIGATOR STRYKERFLOW (MISCELLANEOUS) ×2
IV CATH ANGIO 12GX3 LT BLUE (NEEDLE) IMPLANT
IV NS 1000ML (IV SOLUTION) ×1
IV NS 1000ML BAXH (IV SOLUTION) ×1 IMPLANT
KIT TURNOVER KIT A (KITS) ×2 IMPLANT
KITTNER LAPARASCOPIC 5X40 (MISCELLANEOUS) ×4 IMPLANT
LABEL OR SOLS (LABEL) ×2 IMPLANT
MANIFOLD NEPTUNE II (INSTRUMENTS) ×2 IMPLANT
NEEDLE HYPO 22GX1.5 SAFETY (NEEDLE) ×4 IMPLANT
NS IRRIG 500ML POUR BTL (IV SOLUTION) ×4 IMPLANT
PACK LAP CHOLECYSTECTOMY (MISCELLANEOUS) ×2 IMPLANT
PENCIL ELECTRO HAND CTR (MISCELLANEOUS) ×2 IMPLANT
POUCH SPECIMEN RETRIEVAL 10MM (ENDOMECHANICALS) ×2 IMPLANT
SCISSORS METZENBAUM CVD 33 (INSTRUMENTS) ×2 IMPLANT
SET TUBE SMOKE EVAC HIGH FLOW (TUBING) ×2 IMPLANT
SLEEVE ADV FIXATION 5X100MM (TROCAR) ×4 IMPLANT
SOLUTION ELECTROLUBE (MISCELLANEOUS) ×2 IMPLANT
STRIP CLOSURE SKIN 1/2X4 (GAUZE/BANDAGES/DRESSINGS) ×2 IMPLANT
SUT MNCRL 4-0 (SUTURE) ×1
SUT MNCRL 4-0 27XMFL (SUTURE) ×1
SUT VIC AB 3-0 SH 27 (SUTURE) ×1
SUT VIC AB 3-0 SH 27X BRD (SUTURE) ×1 IMPLANT
SUT VICRYL 0 AB UR-6 (SUTURE) ×4 IMPLANT
SUTURE MNCRL 4-0 27XMF (SUTURE) ×1 IMPLANT
SYS KII FIOS ACCESS ABD 5X100 (TROCAR) ×2
SYSTEM KII FIOS ACES ABD 5X100 (TROCAR) ×1 IMPLANT
TROCAR ADV FIXATION 12X100MM (TROCAR) ×2 IMPLANT
WATER STERILE IRR 1000ML POUR (IV SOLUTION) IMPLANT

## 2020-05-17 NOTE — Anesthesia Procedure Notes (Signed)
Procedure Name: Intubation Date/Time: 05/17/2020 1:28 PM Performed by: Johnna Acosta, CRNA Pre-anesthesia Checklist: Patient identified, Emergency Drugs available, Suction available, Patient being monitored and Timeout performed Patient Re-evaluated:Patient Re-evaluated prior to induction Oxygen Delivery Method: Circle system utilized Preoxygenation: Pre-oxygenation with 100% oxygen Induction Type: IV induction Ventilation: Mask ventilation without difficulty and Oral airway inserted - appropriate to patient size Laryngoscope Size: McGraph and 3 Grade View: Grade II Tube type: Oral Tube size: 7.0 mm Number of attempts: 1 Airway Equipment and Method: Stylet,  Video-laryngoscopy and Oral airway Placement Confirmation: ETT inserted through vocal cords under direct vision Secured at: 21 cm Tube secured with: Tape Dental Injury: Teeth and Oropharynx as per pre-operative assessment  Difficulty Due To: Difficulty was anticipated, Difficult Airway- due to limited oral opening and Difficult Airway- due to dentition

## 2020-05-17 NOTE — Progress Notes (Addendum)
[facility] Day(s): 0.   Interval History:  Patient seen and examined no acute events or new complaints overnight.  Patient reports her abdominal pain is improved, sore in RUQ No fever, chills, nausea, emesis Leukocytosis is slightly worse this morning, up to 14.6K Renal function is normal with sCr - 0.68, UO unmeasured She does have mild hypokalemia to 3.2 this morning; being repleted Liver function normal without hyperbilirubinemia  She continues on Zosyn for acute cholecystitis Plan for laparoscopic cholecystectomy today with Dr Celine Ahr   Vital signs in last 24 hours: [min-max] current  Temp:  [99.1 F (37.3 C)] 99.1 F (37.3 C) (10/24 1955) Pulse Rate:  [86-96] 86 (10/25 0300) Resp:  [14] 14 (10/24 1955) BP: (138-173)/(70-87) 154/75 (10/25 0300) SpO2:  [93 %-98 %] 93 % (10/25 0300) Weight:  [99.3 kg] 99.3 kg (10/24 1952)     Height: 5\' 6"  (167.6 cm) Weight: 99.3 kg BMI (Calculated): 35.36   Intake/Output last 2 shifts:  10/24 0701 - 10/25 0700 In: 1100 [IV Piggyback:1100] Out: -    Physical Exam:  Constitutional: alert, cooperative and no distress  Respiratory: breathing non-labored at rest  Cardiovascular: regular rate and sinus rhythm  Gastrointestinal: Soft, RUQ soreness, non-distended, no rebound/guarding  Labs:  CBC Latest Ref Rng & Units 05/17/2020 05/16/2020  WBC 4.0 - 10.5 K/uL 14.6(H) 12.9(H)  Hemoglobin 12.0 - 15.0 g/dL 12.9 13.6  Hematocrit 36 - 46 % 39.0 41.6  Platelets 150 - 400 K/uL 282 302   CMP Latest Ref Rng & Units 05/17/2020 05/16/2020  Glucose 70 - 99 mg/dL 141(H) 143(H)  BUN 8 - 23 mg/dL 11 11  Creatinine 0.44 - 1.00 mg/dL 0.68 0.60  Sodium 135 - 145 mmol/L 138 137  Potassium 3.5 - 5.1 mmol/L 3.2(L) 3.3(L)  Chloride 98 - 111 mmol/L 97(L) 94(L)  CO2 22 - 32 mmol/L 30 29  Calcium 8.9 - 10.3 mg/dL 9.2 9.7  Total Protein 6.5 - 8.1 g/dL 7.5 8.4(H)  Total Bilirubin 0.3 - 1.2 mg/dL 1.1 0.9   Alkaline Phos 38 - 126 U/L 48 55  AST 15 - 41 U/L 24 27  ALT 0 - 44 U/L 17 19     Imaging studies: No new pertinent imaging studies   Assessment/Plan:  64 y.o. female with acute cholecystitis.   - Plan for laparoscopic cholecystectomy today with Dr Celine Ahr pending OR/Anesthesia availability  - All risks, benefits, and alternatives to above procedure(s) were discussed with the patient, all of her questions were answered to her expressed satisfaction, patient expresses she wishes to proceed, and informed consent was obtained.   - Remain NPO + IVF resuscitation    - Continue IV Abx (Zosyn)  - Monitor abdominal examination  - Pain control prn; antiemetics prn   All of the above findings and recommendations were discussed with the patient, and the medical team, and all of patient's questions were answered to her expressed satisfaction.  -- Edison Simon, PA-C [facility] 05/17/2020, 8:08 AM [phone] M-F: 7am - 4pm  I saw and evaluated the patient.  I agree with the above documentation, exam, and plan, which I have edited where appropriate. Fredirick Maudlin  10:45 AM

## 2020-05-17 NOTE — Transfer of Care (Signed)
Immediate Anesthesia Transfer of Care Note  Patient: Kristina Orozco  Procedure(s) Performed: LAPAROSCOPIC CHOLECYSTECTOMY (N/A Abdomen)  Patient Location: PACU  Anesthesia Type:General  Level of Consciousness: awake and drowsy  Airway & Oxygen Therapy: Patient Spontanous Breathing and Patient connected to face mask oxygen  Post-op Assessment: Report given to RN and Post -op Vital signs reviewed and stable  Post vital signs: Reviewed and stable  Last Vitals:  Vitals Value Taken Time  BP 124/77 05/17/20 1647  Temp    Pulse 90 05/17/20 1648  Resp 20 05/17/20 1648  SpO2 100 % 05/17/20 1648  Vitals shown include unvalidated device data.  Last Pain:  Vitals:   05/17/20 1252  TempSrc: Temporal  PainSc: 3          Complications: No complications documented.

## 2020-05-17 NOTE — Anesthesia Postprocedure Evaluation (Signed)
Anesthesia Post Note  Patient: [name]  Procedure(s) Performed: LAPAROSCOPIC CHOLECYSTECTOMY (N/A Abdomen)  Patient location during evaluation: PACU Anesthesia Type: General Level of consciousness: awake and alert Pain management: pain level controlled Vital Signs Assessment: post-procedure vital signs reviewed and stable Respiratory status: spontaneous breathing, nonlabored ventilation, respiratory function stable and patient connected to nasal cannula oxygen Cardiovascular status: blood pressure returned to baseline and stable Postop Assessment: no apparent nausea or vomiting Anesthetic complications: no   No complications documented.   Last Vitals:  Vitals:   05/17/20 1716 05/17/20 1731  BP: 134/70 118/66  Pulse: 87 89  Resp: 20 20  Temp:    SpO2: 96% 98%    Last Pain:  Vitals:   05/17/20 1731  TempSrc:   PainSc: 0-No pain                 Arita Miss

## 2020-05-17 NOTE — ED Notes (Signed)
Report to anna, rn

## 2020-05-17 NOTE — Anesthesia Preprocedure Evaluation (Addendum)
Anesthesia Evaluation  Patient identified by MRN, date of birth, ID band Patient awake    Reviewed: Allergy & Precautions, H&P , NPO status , Patient's Chart, lab work & pertinent test results, reviewed documented beta blocker date and time   History of Anesthesia Complications Negative for: history of anesthetic complications  Airway Mallampati: IV  TM Distance: >3 FB Neck ROM: full    Dental  (+) Teeth Intact, Dental Advidsory Given, Partial Upper, Missing, Poor Dentition   Pulmonary neg pulmonary ROS,    Pulmonary exam normal        Cardiovascular Exercise Tolerance: Poor hypertension, On Medications (-) angina(-) Past MI and (-) Cardiac Stents Normal cardiovascular exam(-) dysrhythmias (-) Valvular Problems/Murmurs Rhythm:regular Rate:Normal     Neuro/Psych negative neurological ROS  negative psych ROS   GI/Hepatic negative GI ROS, Neg liver ROS,   Endo/Other  diabetes, Well Controlled  Renal/GU negative Renal ROS  negative genitourinary   Musculoskeletal   Abdominal   Peds  Hematology negative hematology ROS (+)   Anesthesia Other Findings Past Medical History: No date: Diabetes mellitus without complication ([place]) No date: Hypertension Past Surgical History: No date: ABDOMINAL HYSTERECTOMY 2004: APPENDECTOMY 2011: BREAST BIOPSY; Left 08/26/13: BREAST BIOPSY; Right 08/28/13: breast mass excision; Right     Comment:  papilloma 01/31/2018: COLONOSCOPY WITH PROPOFOL; N/A     Comment:  Procedure: COLONOSCOPY WITH PROPOFOL;  Surgeon: Jonathon Bellows, MD;  Location: [facility] ENDOSCOPY;  Service:               Gastroenterology;  Laterality: N/A; BMI    Body Mass Index: 35.35 kg/m     Reproductive/Obstetrics negative OB ROS                            Anesthesia Physical Anesthesia Plan  ASA: II  Anesthesia Plan: General   Post-op Pain Management:    Induction:  Intravenous  PONV Risk Score and Plan: 4 or greater and Ondansetron, Dexamethasone, Midazolam, Treatment may vary due to age or medical condition and Promethazine  Airway Management Planned: Oral ETT  Additional Equipment:   Intra-op Plan:   Post-operative Plan: Extubation in OR  Informed Consent: I have reviewed the patients History and Physical, chart, labs and discussed the procedure including the risks, benefits and alternatives for the proposed anesthesia with the patient or authorized representative who has indicated his/her understanding and acceptance.     Dental Advisory Given  Plan Discussed with: CRNA  Anesthesia Plan Comments:        Anesthesia Quick Evaluation

## 2020-05-17 NOTE — Op Note (Signed)
Laparoscopic Cholecystectomy  Pre-operative Diagnosis: Acute calculus cholecystitis  Post-operative Diagnosis: Acute on chronic calculus cholecystitis  Procedure: Laparoscopic lysis of adhesions, laparoscopic cholecystectomy  Surgeon: Fredirick Maudlin, MD  Anesthesia: GETA  Assistant: None   Findings: The omentum was adhesed to the abdominal wall surrounding the umbilicus and all the way up towards the falciform ligament.  In order to place our trocar safely, adhesiolysis was required in order to clear the abdominal wall and avoid potential injury to underlying structures such as bowel.  The gallbladder itself was extremely inflamed and tense.  It was full of countless stones.  It was extremely large and floppy with an inflammatory rind encasing the cystic duct, cystic artery, and common bile duct.  All of these findings were suggestive of acute on chronic calculus cholecystitis.   Estimated Blood Loss: 20 cc         Drains: None         Specimens: Gallbladder           Complications: none   Procedure Details  The patient was seen again in the preoperative holding area. The benefits, complications, treatment options, and expected outcomes were discussed with the patient. The risks of bleeding, infection, recurrence of symptoms, failure to resolve symptoms, bile duct damage, bile duct leak, retained common bile duct stone, bowel injury, any of which could require further surgery and/or ERCP, stent, or papillotomy were reviewed with the patient. The likelihood of improving the patient's symptoms with return to their baseline status is good.  The patient and/or family concurred with the proposed plan, giving informed consent.  The patient was taken to operating room, identified as Kristina Orozco and the procedure verified as Laparoscopic Cholecystectomy. A time out was performed and the above information confirmed.  Prior to the induction of general anesthesia, antibiotic prophylaxis was  administered. VTE prophylaxis was in place. General endotracheal anesthesia was then administered and tolerated well. After the induction, the abdomen was prepped with Chloraprep and draped in the sterile fashion. The patient was positioned in the supine position.  After the patient was paralyzed, palpation of the abdomen in the midclavicular line just below the costal margin revealed a palpable gallbladder and liver edge, meaning that Optiview technique would not be advised in this site.  I elected to enter in the left upper quadrant in the midclavicular line.  A 5 mm trocar and Optiview technique was utilized to enter the abdomen in this location. Pneumoperitoneum was then created with CO2 and tolerated well without any adverse changes in the patient's vital signs.  Inspection of the abdominal cavity demonstrated that the omentum was adhesed to the posterior abdominal wall from the umbilicus up to the falciform ligament.  In order to place our camera trocar, I placed a second trocar in the right upper quadrant.  A combination of blunt dissection and electrocautery was used to release enough of the omental adhesions that I had a clear placed to insert a periumbilical trocar.  I also appreciated a small umbilical hernia.  Because of this, I placed my periumbilical trocar just off the midline at the level of the umbilicus.  A third 5 mm trocar was then placed under direct vision.. All skin incisions  were infiltrated with a local anesthetic agent before making the incision and placing the trocars.   The patient was positioned in reverse Trendelenburg, tilted slightly to the patient's left.  The gallbladder was identified.  It was grossly inflamed.  I was unable to grasp it  to retract the fundus, therefore it was aspirated with a sharp trocar.  Only a small amount of bile was able to be aspirated.  It seemed that the gallbladder was predominantly filled of stones.  Ultimately, the fundus grasped and retracted  cephalad. Adhesions were lysed bluntly for approximately an hour and a half in order to free the duodenum from the common duct and underside of the liver.  Additional careful dissection using the suction irrigator and a Kitner ultimately enabled me to free the cystic duct from the common bile duct as well as expose the cystic artery.  This also required partial dissection of the gallbladder out of the fossa to provide sufficient mobility for good visualization.. An extended critical view of the cystic duct and cystic artery was finally obtained.  The cystic duct was clearly identified and bluntly dissected free. Both the cystic artery and duct were double clipped and divided.  The gallbladder was taken from the gallbladder fossa in a retrograde fashion with the electrocautery.  The wall of the gallbladder was violated and innumerable stones spilled into the gallbladder fossa.  The gallbladder was removed and placed in an Endopouch bag.  It was withdrawn via the periumbilical site.  We then used to the stone forceps and removed all of the spilled gallstones from the abdominal cavity.  He liver bed was irrigated and inspected. Copious saline irrigation was utilized and was repeatedly aspirated until clear.  Hemostasis was achieved with a combination of electrocautery, Vistaseal, and Arista.  At this point, I elected to leave the umbilical hernia in place and come back another day to repair it, if the patient desired.  Inspection of the right upper quadrant was performed. No bleeding, bile duct injury or leak, or bowel injury was noted. Pneumoperitoneum was released.  The periumbilical port site was closed with interrumpted 0 Vicryl sutures. 4-0 subcuticular Monocryl was used to close the skin. Dermabond was applied.  The patient was then extubated and brought to the recovery room in stable condition. Sponge, lap, and needle counts were correct at closure and at the conclusion of the case.               Fredirick Maudlin, MD, FACS

## 2020-05-18 ENCOUNTER — Encounter: Payer: Self-pay | Admitting: General Surgery

## 2020-05-18 LAB — GLUCOSE, CAPILLARY
Glucose-Capillary: 117 mg/dL — ABNORMAL HIGH (ref 70–99)
Glucose-Capillary: 174 mg/dL — ABNORMAL HIGH (ref 70–99)
Glucose-Capillary: 204 mg/dL — ABNORMAL HIGH (ref 70–99)
Glucose-Capillary: 210 mg/dL — ABNORMAL HIGH (ref 70–99)

## 2020-05-18 MED ORDER — OXYCODONE HCL 5 MG PO TABS: 5.0000 mg | ORAL_TABLET | Freq: Four times a day (QID) | ORAL | 0 refills | Status: DC | PRN

## 2020-05-18 MED ORDER — AMOXICILLIN-POT CLAVULANATE 875-125 MG PO TABS: 1.0000 | ORAL_TABLET | Freq: Two times a day (BID) | ORAL | 0 refills | Status: AC

## 2020-05-18 MED ORDER — IBUPROFEN 800 MG PO TABS: 800.0000 mg | ORAL_TABLET | Freq: Three times a day (TID) | ORAL | 0 refills | Status: DC | PRN

## 2020-05-18 NOTE — Discharge Instructions (Signed)
In addition to included general post-operative instructions for laparoscopic cholecystectomy (gallbladder removal),  Diet: Resume home diet. Recommend limiting fatty/greasy foods in the next few days while your body adjusts to not having a gallbladder. If you do eat fatty/greasy foods you may, or may not, notice diarrhea. This is expected and resolves with time.   Activity: No heavy lifting >20 pounds (children, pets, laundry, garbage) for 4 weeks, but light activity and walking are encouraged. Do not drive or drink alcohol if taking narcotic pain medications or having pain that might distract from driving.  Wound care: 2 days after surgery (10/26), you may shower/get incision wet with soapy water and pat dry (do not rub incisions), but no baths or submerging incision underwater until follow-up.   Medications: Resume all home medications. For mild to moderate pain: acetaminophen (Tylenol) or ibuprofen/naproxen (if no kidney disease). Combining Tylenol with alcohol can substantially increase your risk of causing liver disease. Narcotic pain medications, if prescribed, can be used for severe pain, though may cause nausea, constipation, and drowsiness. Do not combine Tylenol and Percocet (or similar) within a 6 hour period as Percocet (and similar) contain(s) Tylenol. If you do not need the narcotic pain medication, you do not need to fill the prescription.  Call office [phone] / [phone]) at any time if any questions, worsening pain, fevers/chills, bleeding, drainage from incision site, or other concerns.

## 2020-05-18 NOTE — Progress Notes (Signed)
Kristina Orozco  A and O x 4 VSS. Pt tolerating diet well. No complaints of pain or nausea. IV removed intact, prescriptions given. Pt voices understanding of discharge instructions with no further questions. Pt discharged home via wheelchair with family.   Allergies as of 05/18/2020   No Known Allergies     Medication List    TAKE these medications   amoxicillin-clavulanate 875-125 MG tablet Commonly known as: Augmentin Take 1 tablet by mouth 2 (two) times daily for 10 days.   calcium acetate 667 MG capsule Commonly known as: PHOSLO Take by mouth 3 (three) times daily with meals.   hydrochlorothiazide 25 MG tablet Commonly known as: HYDRODIURIL Take 25 mg by mouth daily.   ibuprofen 800 MG tablet Commonly known as: ADVIL Take 1 tablet (800 mg total) by mouth every 8 (eight) hours as needed.   multivitamin with minerals tablet Take 1 tablet by mouth daily.   oxyCODONE 5 MG immediate release tablet Commonly known as: Oxy IR/ROXICODONE Take 1 tablet (5 mg total) by mouth every 6 (six) hours as needed for severe pain or breakthrough pain.   pravastatin 10 MG tablet Commonly known as: PRAVACHOL Take 10 mg by mouth at bedtime.       Vitals:   05/17/20 2327 05/18/20 0425  BP: 118/75 132/78  Pulse: (!) 102 92  Resp: 16 20  Temp: 99.5 F (37.5 C) 98.4 F (36.9 C)  SpO2: 94% 96%    Kristina Orozco

## 2020-05-18 NOTE — Discharge Summary (Addendum)
[facility] SURGICAL DISCHARGE SUMMARY  Patient ID: Kristina Orozco MRN: [id number] DOB/AGE: 65-Nov-1957 65 y.o.  Admit date: 05/16/2020 Discharge date: 05/18/2020  Discharge Diagnoses Patient Active Problem List   Diagnosis Date Noted  . S/P laparoscopic cholecystectomy 05/17/2020  . Acute calculous cholecystitis 05/16/2020    Consultants None  Procedures 05/17/2020:  Laparoscopic Cholecystectomy   HPI: Kristina Orozco is an 64 y.o. female who presented to the emergency department 10/24 with right upper quadrant pain that began this morning.  It persisted throughout the day despite efforts to alleviate the pain.  She had an egg and [place] sausage for breakfast which she then vomited up.  She has had multiple episodes of nonbilious nonbloody emesis.  The pain is in her right upper quadrant and radiates to her back.  She has never had similar pain in the past.  She denies any fevers or chills.  No diarrhea.  She has never had jaundice, pancreatitis, or other symptoms suggestive of biliary colic.  Work-up in the emergency department demonstrated an elevated white blood count of 12.9, normal transaminases, normal lipase, and the right upper quadrant ultrasound was consistent with acute cholecystitis.  General surgery has been consulted in this context for further evaluation and management.  Hospital Course: Informed consent was obtained and documented, and patient underwent uneventful laparoscopic cholecystectomy (Dr Celine Ahr, 05/17/2020).  Post-operatively, patient's pain/symptoms improved/resolved and advancement of patient's diet and ambulation were well-tolerated. The remainder of patient's hospital course was essentially unremarkable, and discharge planning was initiated accordingly with patient safely able to be discharged home with appropriate discharge instructions, antibiotics (Augmentin x10 day), pain control, and outpatient follow-up after all of her questions were  answered to her expressed satisfaction.   Discharge Condition: Good   Physical Examination:  Constitutional: Well appearing female, NAD Pulmonary: Normal effort, no respiratory distress Gastrointestinal: Soft, incisional soreness, non-distended, no rebound/guarding Skin: Laparoscopic incisions are CDI with steri-strips, no erythema or drainage    Allergies as of 05/18/2020   No Known Allergies     Medication List    TAKE these medications   amoxicillin-clavulanate 875-125 MG tablet Commonly known as: Augmentin Take 1 tablet by mouth 2 (two) times daily for 10 days.   calcium acetate 667 MG capsule Commonly known as: PHOSLO Take by mouth 3 (three) times daily with meals.   hydrochlorothiazide 25 MG tablet Commonly known as: HYDRODIURIL Take 25 mg by mouth daily.   ibuprofen 800 MG tablet Commonly known as: ADVIL Take 1 tablet (800 mg total) by mouth every 8 (eight) hours as needed.   multivitamin with minerals tablet Take 1 tablet by mouth daily.   oxyCODONE 5 MG immediate release tablet Commonly known as: Oxy IR/ROXICODONE Take 1 tablet (5 mg total) by mouth every 6 (six) hours as needed for severe pain or breakthrough pain.   pravastatin 10 MG tablet Commonly known as: PRAVACHOL Take 10 mg by mouth at bedtime.         Follow-up Information    Tylene Fantasia, PA-C. Schedule an appointment as soon as possible for a visit in 2 week(s).   Specialty: Physician Assistant Why: s/p lap cholecystectomy , prefers morning appointment Contact information: [address] [place] [address] [phone]                Time spent on discharge management including discussion of hospital course, clinical condition, outpatient instructions, prescriptions, and follow up with the patient and members of the medical team: >30 minutes  --  Edison Simon , PA-C [facility]  05/18/2020, 8:45 AM [phone] M-F: 7am - 4pm

## 2020-05-19 ENCOUNTER — Other Ambulatory Visit: Payer: Self-pay | Admitting: Pathology

## 2020-05-20 LAB — SURGICAL PATHOLOGY

## 2020-05-21 ENCOUNTER — Other Ambulatory Visit: Payer: Self-pay

## 2020-05-21 ENCOUNTER — Telehealth (INDEPENDENT_AMBULATORY_CARE_PROVIDER_SITE_OTHER): Payer: Self-pay | Admitting: General Surgery

## 2020-05-21 DIAGNOSIS — C23 Malignant neoplasm of gallbladder: Secondary | ICD-10-CM

## 2020-05-21 NOTE — Telephone Encounter (Signed)
Discussed pathology with patient and let her know that she will need additional treatment. Offered referral to [facility] surgery and her preference was to go to [facility]. Will make referral to [facility] there, as well as to oncology.

## 2020-05-26 ENCOUNTER — Encounter: Payer: Self-pay | Admitting: Internal Medicine

## 2020-05-26 ENCOUNTER — Inpatient Hospital Stay: Payer: Medicare Other

## 2020-05-26 ENCOUNTER — Inpatient Hospital Stay: Payer: Medicare Other | Attending: Internal Medicine | Admitting: Internal Medicine

## 2020-05-26 ENCOUNTER — Other Ambulatory Visit: Payer: Self-pay

## 2020-05-26 DIAGNOSIS — C23 Malignant neoplasm of gallbladder: Secondary | ICD-10-CM | POA: Diagnosis present

## 2020-05-26 DIAGNOSIS — I10 Essential (primary) hypertension: Secondary | ICD-10-CM | POA: Diagnosis not present

## 2020-05-26 DIAGNOSIS — Z79899 Other long term (current) drug therapy: Secondary | ICD-10-CM | POA: Diagnosis not present

## 2020-05-26 DIAGNOSIS — E119 Type 2 diabetes mellitus without complications: Secondary | ICD-10-CM | POA: Diagnosis not present

## 2020-05-26 LAB — CBC WITH DIFFERENTIAL/PLATELET
Abs Immature Granulocytes: 0.05 10*3/uL (ref 0.00–0.07)
Basophils Absolute: 0.1 10*3/uL (ref 0.0–0.1)
Basophils Relative: 1 %
Eosinophils Absolute: 0.1 10*3/uL (ref 0.0–0.5)
Eosinophils Relative: 1 %
HCT: 36.9 % (ref 36.0–46.0)
Hemoglobin: 12 g/dL (ref 12.0–15.0)
Immature Granulocytes: 0 %
Lymphocytes Relative: 19 %
Lymphs Abs: 2.2 10*3/uL (ref 0.7–4.0)
MCH: 27.6 pg (ref 26.0–34.0)
MCHC: 32.5 g/dL (ref 30.0–36.0)
MCV: 84.8 fL (ref 80.0–100.0)
Monocytes Absolute: 1 10*3/uL (ref 0.1–1.0)
Monocytes Relative: 8 %
Neutro Abs: 8.6 10*3/uL — ABNORMAL HIGH (ref 1.7–7.7)
Neutrophils Relative %: 71 %
Platelets: 474 10*3/uL — ABNORMAL HIGH (ref 150–400)
RBC: 4.35 MIL/uL (ref 3.87–5.11)
RDW: 14 % (ref 11.5–15.5)
WBC: 12.1 10*3/uL — ABNORMAL HIGH (ref 4.0–10.5)
nRBC: 0 % (ref 0.0–0.2)

## 2020-05-26 LAB — COMPREHENSIVE METABOLIC PANEL
ALT: 29 U/L (ref 0–44)
AST: 31 U/L (ref 15–41)
Albumin: 3.5 g/dL (ref 3.5–5.0)
Alkaline Phosphatase: 106 U/L (ref 38–126)
Anion gap: 11 (ref 5–15)
BUN: 9 mg/dL (ref 8–23)
CO2: 33 mmol/L — ABNORMAL HIGH (ref 22–32)
Calcium: 9.5 mg/dL (ref 8.9–10.3)
Chloride: 96 mmol/L — ABNORMAL LOW (ref 98–111)
Creatinine, Ser: 0.48 mg/dL (ref 0.44–1.00)
GFR, Estimated: >60 mL/min (ref >60)
Glucose, Bld: 135 mg/dL — ABNORMAL HIGH (ref 70–99)
Potassium: 3.3 mmol/L — ABNORMAL LOW (ref 3.5–5.1)
Sodium: 140 mmol/L (ref 135–145)
Total Bilirubin: 0.9 mg/dL (ref 0.3–1.2)
Total Protein: 8.2 g/dL — ABNORMAL HIGH (ref 6.5–8.1)

## 2020-05-26 NOTE — Assessment & Plan Note (Addendum)
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  Discussed with the patient that she will need extensive surgery for complete resection.  Also recommend CT chest with contrast; also MR abdomen with and without contrast for further evaluation of the disease.  Check NGS; MMR.  # If patient has no distant metastatic disease discussed with the patient the role of chemotherapy-adjuvant/neoadjuvant.  I would prefer neoadjuvant approach as it would help micrometastatic disease.   # weight loss-likely sec to surgery/malignancy.  Await imaging.  Thank you Dr.Cannon for allowing me to participate in the care of your pleasant patient. Please do not hesitate to contact me with questions or concerns in the interim.  We will also review at the tumor conference.  Patient awaiting surgical evaluation at [facility].  Patient also interested in evaluation with surgery locally.   # DISPOSITION: # labs today- cbc/cmp;Ca-19-9 # MRI Abdomen; CT chest ASAP- AM appts/pt preference # Follow up 1-2 days after the scans are done- MD; no labs- Dr.B  # 60 minutes face-to-face with the patient discussing the above plan of care; more than 50% of time spent on prognosis/ natural history; counseling and coordination.

## 2020-05-26 NOTE — Progress Notes (Signed)
[place] CONSULT NOTE  Patient Care Team: Revelo, Elyse Jarvis, MD as PCP - General (Family Medicine) Rico Junker, RN as Registered Nurse Christene Lye, MD (General Surgery)  CHIEF COMPLAINTS/PURPOSE OF CONSULTATION: GALLBLADDER CANCER   #  Oncology History Overview Note  10/25- GALLBLADDER CANCER- [Dr.Cannon]- laproscopic cholecystectomy- pT Category: pT3; pN Category: pN1  pM Category: Not applicable   SURGICAL PATHOLOGY  CASE: [id number]  PATIENT: Kristina Orozco  Surgical Pathology Report      Specimen Submitted:  A. Gallbladder   Clinical History: Cholecystitis.       DIAGNOSIS:  A. GALLBLADDER; CHOLECYSTECTOMY:  - ADENOCARCINOMA OF THE GALLBLADDER.  - CARCINOMA IN SITU.  - SEE CANCER SUMMARY BELOW.  - PERINEURAL INVASION IS PRESENT.  - ACUTE ON CHRONIC CHOLECYSTITIS WITH CHOLELITHIASIS.  - METASTATIC ADENOCARCINOMA INVOLVES CYSTIC DUCT LYMPH NODE (1/1).   CANCER CASE SUMMARY: GALLBLADDER  Standard(s): AJCC-UICC 8   SPECIMEN  Procedure: Simple cholecystectomy:   TUMOR  Tumor Site: Fundus  Histologic Type: Adenocarcinoma, biliary type  Histologic Grade: G3, poorly differentiated  Tumor Size: Greatest dimension: 2.2 cm  Tumor Extent: Tumor directly invades the liver  Lymphovascular Invasion: Present   MARGINS  Margin Status for Invasive Carcinoma:      Margin Involved by Invasive Carcinoma: Liver parenchymal / hepatic  bed  Margin Status for Intraepithelial Neoplasia:      All margins negative for high-grade intraepithelial neoplasia   REGIONAL LYMPH NODES  Regional Lymph Node Status: Tumor present in regional lymph node       Number of Lymph Nodes with Tumor: 1       Number of Lymph Nodes Examined: 1   DISTANT METASTASIS  Distant Site(s) Involved, if applicable: Not applicable   PATHOLOGIC STAGE CLASSIFICATION (pTNM, AJCC 8th Edition):  TNM Descriptors: Not applicable  pT Category: pT3  Regional Lymph  Nodes Modifier: Not applicable  pN Category: pN1  pM Category: Not applicable   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  female with no significant past medical history except for hypertension has been referred to [place] for further evaluation recommendations for gallbladder cancer.  Patient states that she had episode of acute abdominal pain; diagnosed with acute cholecystitis.  This led to further evaluation with surgery; and patient went on to have laparoscopic cholecystectomy.  However, pathology was positive for adenocarcinoma.  Patient is recovering well from surgery.  No nausea no vomiting.  Positive weight loss.  Review of Systems  Constitutional: Positive for weight loss. Negative for chills, diaphoresis, fever and malaise/fatigue.  HENT: Negative for nosebleeds and sore throat.   Eyes: Negative for double vision.  Respiratory: Negative for cough, hemoptysis, sputum production, shortness of breath and wheezing.   Cardiovascular: Negative for chest pain, palpitations, orthopnea and leg swelling.  Gastrointestinal: Negative for abdominal pain, blood in stool, constipation, diarrhea, heartburn, melena, nausea and vomiting.  Genitourinary: Negative for dysuria, frequency and urgency.  Musculoskeletal: Negative for back pain and joint pain.  Skin: Negative.  Negative for itching and rash.  Neurological: Negative for dizziness, tingling, focal weakness, weakness and headaches.  Endo/Heme/Allergies: Does not bruise/bleed easily.  Psychiatric/Behavioral: Negative for depression. The  patient is not nervous/anxious and does not have insomnia.      MEDICAL HISTORY:  Past Medical History:  Diagnosis Date  . Diabetes mellitus without  complication ([place])   . Hypertension     SURGICAL HISTORY: Past Surgical History:  Procedure Laterality Date  . ABDOMINAL HYSTERECTOMY    . APPENDECTOMY  2004  . BREAST BIOPSY Left 2011  . BREAST BIOPSY Right 08/26/13  . 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/28/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  . CHOLECYSTECTOMY N/A 05/17/2020   Procedure: LAPAROSCOPIC CHOLECYSTECTOMY;  Surgeon: Fredirick Maudlin, MD;  Location: [facility] ORS;  Service: General;  Laterality: N/A;  . COLONOSCOPY WITH PROPOFOL N/A 01/31/2018   Procedure: COLONOSCOPY WITH PROPOFOL;  Surgeon: Jonathon Bellows, MD;  Location: [facility] ENDOSCOPY;  Service: Gastroenterology;  Laterality: N/A;    SOCIAL HISTORY: Social History   Socioeconomic History  . Marital status: Single    Spouse name: Not on file  . Number of children: Not on file  . Years of education: Not on file  . Highest education level: Not on file  Occupational History  . Not on file  Tobacco Use  . Smoking status: Never Smoker  . Smokeless tobacco: Never Used  Substance and Sexual Activity  . Alcohol use: Yes  . Drug use: No  . Sexual activity: Not on file  Other Topics Concern  . Not on file  Social History Narrative   Lives in [place]; with son. 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 Never smoked; beer/iqour over weekends.    Social Determinants of Health   Financial Resource Strain:   . Difficulty of Paying Living Expenses: Not on file  Food Insecurity:   . Worried About Charity fundraiser in the Last Year: Not on file  . Ran Out of Food in the Last Year: Not on file  Transportation Needs:   . Lack of Transportation (Medical): Not on file  . Lack of Transportation (Non-Medical): Not on file  Physical Activity:   . Days of Exercise per Week: Not on file  . Minutes of Exercise per Session: Not on file  Stress:   . Feeling of Stress : Not on file  Social Connections:   . Frequency of Communication with Friends and Family: Not on file  . Frequency of Social  Gatherings with Friends and Family: Not on file  . Attends Religious Services: Not on file  . Active Member of Clubs or Organizations: Not on file  . Attends Archivist Meetings: Not on file  . Marital Status: Not on file  Intimate Partner Violence:   . Fear of Current or Ex-Partner: Not on file  . Emotionally Abused: Not on file  . Physically Abused: Not on file  . Sexually Abused: Not on file    FAMILY HISTORY: Family History  Problem Relation Age of Onset  . Pancreatic cancer Mother        AT 105 years  . Breast cancer Sister        appx-55 years  . Pancreatic cancer Maternal Grandmother     ALLERGIES:  has No Known Allergies.  MEDICATIONS:  Current Outpatient Medications  Medication Sig Dispense Refill  . amoxicillin-clavulanate (AUGMENTIN) 875-125 MG tablet Take 1 tablet by mouth 2 (two) times daily for 10 days. 20 tablet 0  . calcium acetate (PHOSLO) 667 MG capsule Take by mouth 3 (three) times daily with meals.    . hydrochlorothiazide (HYDRODIURIL) 25 MG tablet Take 25 mg by mouth daily.    Marland Kitchen  ibuprofen (ADVIL) 800 MG tablet Take 1 tablet (800 mg total) by mouth every 8 (eight) hours as needed. 30 tablet 0  . Multiple Vitamins-Minerals (MULTIVITAMIN WITH MINERALS) tablet Take 1 tablet by mouth daily.    Marland Kitchen oxyCODONE (OXY IR/ROXICODONE) 5 MG immediate release tablet Take 1 tablet (5 mg total) by mouth every 6 (six) hours as needed for severe pain or breakthrough pain. 20 tablet 0  . pravastatin (PRAVACHOL) 10 MG tablet Take 10 mg by mouth at bedtime.     No current facility-administered medications for this visit.      Marland Kitchen  PHYSICAL EXAMINATION: ECOG PERFORMANCE STATUS: 0 - Asymptomatic  Vitals:   05/26/20 1113  BP: (!) 150/77  Pulse: 93  Temp: (!) 97.1 F (36.2 C)  SpO2: 98%   Filed Weights   05/26/20 1113  Weight: 214 lb 6.4 oz (97.3 kg)    Physical Exam HENT:     Head: Normocephalic and atraumatic.     Mouth/Throat:     Pharynx: No  oropharyngeal exudate.  Eyes:     Pupils: Pupils are equal, round, and reactive to light.  Cardiovascular:     Rate and Rhythm: Normal rate and regular rhythm.  Pulmonary:     Effort: Pulmonary effort is normal. No respiratory distress.     Breath sounds: Normal breath sounds. No wheezing.  Abdominal:     General: Bowel sounds are normal. There is no distension.     Palpations: Abdomen is soft. There is no mass.     Tenderness: There is no abdominal tenderness. There is no guarding or rebound.  Musculoskeletal:        General: No tenderness. Normal range of motion.     Cervical back: Normal range of motion and neck supple.  Skin:    General: Skin is warm.  Neurological:     Mental Status: She is alert and oriented to person, place, and time.  Psychiatric:        Mood and Affect: Affect normal.      LABORATORY DATA:  I have reviewed the data as listed Lab Results  Component Value Date   WBC 12.1 (H) 05/26/2020   HGB 12.0 05/26/2020   HCT 36.9 05/26/2020   MCV 84.8 05/26/2020   PLT 474 (H) 05/26/2020   Recent Labs    05/16/20 1959 05/17/20 0340 05/26/20 1231  NA 137 138 140  K 3.3* 3.2* 3.3*  CL 94* 97* 96*  CO2 29 30 33*  GLUCOSE 143* 141* 135*  BUN '11 11 9  ' CREATININE 0.60 0.68 0.48  CALCIUM 9.7 9.2 9.5  GFRNONAA >60 >60 >60  PROT 8.4* 7.5 8.2*  ALBUMIN 4.4 4.1 3.5  AST '27 24 31  ' ALT '19 17 29  ' ALKPHOS 55 48 106  BILITOT 0.9 1.1 0.9    RADIOGRAPHIC STUDIES: I have personally reviewed the radiological images as listed and agreed with the findings in the report. US ABDOMEN LIMITED RUQ (LIVER/GB)  Result Date: 05/16/2020 CLINICAL DATA:  Right upper quadrant pain and vomiting. EXAM: ULTRASOUND ABDOMEN LIMITED RIGHT UPPER QUADRANT COMPARISON:  None. FINDINGS: Gallbladder: Multiple mobile echogenic gallstones are seen within the gallbladder lumen (the largest measures approximately 3.0 cm). The gallbladder wall measures 9 mm in thickness and is irregular in  appearance. No sonographic Murphy sign noted by sonographer, however, the patient was heavily medicated at the time of the exam. Common bile duct: Diameter: 3.5 mm Liver: No focal lesion identified. There is mildly increased echogenicity of the  liver parenchyma. Portal vein is patent on color Doppler imaging with normal direction of blood flow towards the liver. Other: None. IMPRESSION: 1. Findings likely consistent with acute cholecystitis, despite the absence of a positive sonographic Murphy sign. Further correlation with a nuclear medicine hepatobiliary scan is recommended. Electronically Signed   By: Virgina Norfolk M.D.   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 Stage III- T3N1; liver margin positive.  Discussed with the patient that she will need extensive surgery for complete resection.  Also recommend CT chest with contrast; also MR abdomen with and without contrast for further evaluation of the disease.  Check NGS; MMR.  # If patient has no distant metastatic disease discussed with the patient the role of chemotherapy-adjuvant/neoadjuvant.  I would prefer neoadjuvant approach as it would help micrometastatic disease.   # weight loss-likely sec to surgery/malignancy.  Await imaging.  Thank you Dr.Cannon for allowing me to participate in the care of your pleasant patient. Please do not hesitate to contact me with questions or concerns in the interim.  We will also review at the tumor conference.  Patient awaiting surgical evaluation at [facility].  Patient also interested in evaluation with surgery locally.   # DISPOSITION: # labs today- cbc/cmp;Ca-19-9 # MRI Abdomen; CT chest ASAP- AM appts/pt preference # Follow up 1-2 days after the scans are done- MD; no labs- Dr.B  # 60 minutes face-to-face with the patient discussing the above plan of care; more than 50% of time spent on  prognosis/ natural history; counseling and coordination.    All questions were answered. The patient knows to call the clinic with any problems, questions or concerns.       Cammie Sickle, MD 05/27/2020 8:09 AM

## 2020-05-27 ENCOUNTER — Other Ambulatory Visit: Payer: Medicare Other

## 2020-05-27 ENCOUNTER — Telehealth: Payer: Self-pay | Admitting: Internal Medicine

## 2020-05-27 LAB — CANCER ANTIGEN 19-9: CA 19-9: 2074 U/mL — ABNORMAL HIGH (ref 0–35)

## 2020-05-27 NOTE — Telephone Encounter (Signed)
On 11/04-reviewed the tumor conference.  Discussed with Dr. Celine Ahr.   As per patient preference will also make a referral to Dr. Barry Dienes.   C- please make a referral to Dr,Byerly.  [name]-patient needs consultation with Dr. Barry Dienes in [place] ASAP.   Thanks GB

## 2020-05-27 NOTE — Progress Notes (Signed)
Per Dr Celine Ahr the patient wants to be seen in [facility] for surgery of her liver. Dr Aletha Halim office will be taking care of this.

## 2020-05-28 ENCOUNTER — Other Ambulatory Visit: Payer: Self-pay

## 2020-05-28 DIAGNOSIS — C23 Malignant neoplasm of gallbladder: Secondary | ICD-10-CM

## 2020-05-28 NOTE — Telephone Encounter (Signed)
It appears someone has already taken care of this and she is scheduled to see Dr. Barry Dienes in [place] on Monday, November 8. Dr. Barry Dienes is not in [place] again until 11/22.

## 2020-05-28 NOTE — Progress Notes (Signed)
Tumor Board Documentation  Kristina Orozco was presented by Dr Rogue Bussing at our Tumor Board on 05/27/2020, which included representatives from medical oncology, radiation oncology, internal medicine, navigation, pathology, radiology, surgical, pharmacy, research, palliative care, pulmonology.  Kristina Orozco currently presents as a new patient, for [place], for new positive pathology with history of the following treatments: active survellience, surgical intervention(s).  Additionally, we reviewed previous medical and familial history, history of present illness, and recent lab results along with all available histopathologic and imaging studies. The tumor board considered available treatment options and made the following recommendations: Additional screening Neoadjuvant chemo followedby surgery vs Surgery then chemo  The following procedures/referrals were also placed: No orders of the defined types were placed in this encounter.   Clinical Trial Status: not discussed   Staging used: To be determined, Pathologic Stage  AJCC Staging: T: p3 N: p1   Group: Cholangiocarcinoma   National site-specific guidelines   were discussed with respect to the case.  Tumor board is a meeting of clinicians from various specialty areas who evaluate and discuss patients for whom a multidisciplinary approach is being considered. Final determinations in the plan of care are those of the provider(s). The responsibility for follow up of recommendations given during tumor board is that of the provider.   Today's extended care, comprehensive team conference, Kristina Orozco was not present for the discussion and was not examined.   Multidisciplinary Tumor Board is a multidisciplinary case peer review process.  Decisions discussed in the Multidisciplinary Tumor Board reflect the opinions of the specialists present at the conference without having examined the patient.  Ultimately, treatment and diagnostic decisions rest with the  primary provider(s) and the patient.

## 2020-05-31 ENCOUNTER — Other Ambulatory Visit: Payer: Self-pay | Admitting: Internal Medicine

## 2020-05-31 ENCOUNTER — Ambulatory Visit: Payer: Medicare Other

## 2020-05-31 ENCOUNTER — Other Ambulatory Visit: Payer: Self-pay

## 2020-05-31 ENCOUNTER — Ambulatory Visit
Admission: RE | Admit: 2020-05-31 | Discharge: 2020-05-31 | Disposition: A | Discharge status: Home / Self Care | Payer: Medicare Other | Source: Ambulatory Visit | Attending: Internal Medicine | Admitting: Internal Medicine

## 2020-05-31 ENCOUNTER — Telehealth: Payer: Self-pay | Admitting: *Deleted

## 2020-05-31 DIAGNOSIS — C23 Malignant neoplasm of gallbladder: Secondary | ICD-10-CM

## 2020-05-31 IMAGING — CT CT CHEST W/ CM
2 of 3 series · 15 of 36 positions shown, 18 images · IV contrast (omnipaque)
Comparison: None.

CLINICAL DATA: Chest staging, recent diagnosis of gallbladder
adenocarcinoma status post cholecystectomy.

EXAM:
CT CHEST WITH CONTRAST
TECHNIQUE: Multidetector CT imaging of the chest was performed during
intravenous contrast administration.
CONTRAST:  75mL OMNIPAQUE IOHEXOL 300 MG/ML  SOLN

[Series 2: axial st · axial · 0.66mm/px · z∈[-294,-70]mm · 12 of 132 slices shown, 15 images]
[im 10/132  mediastinal]
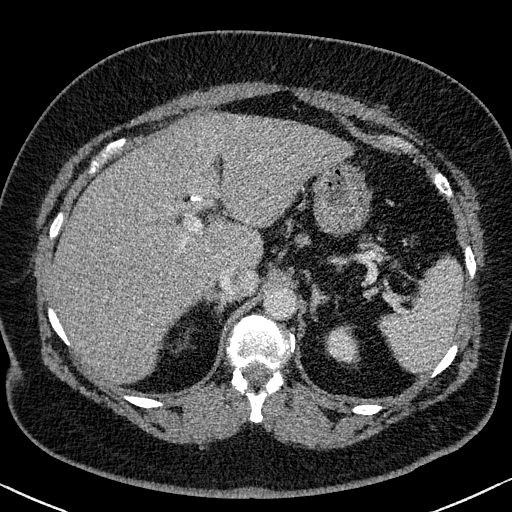
[im 10/132  lung]
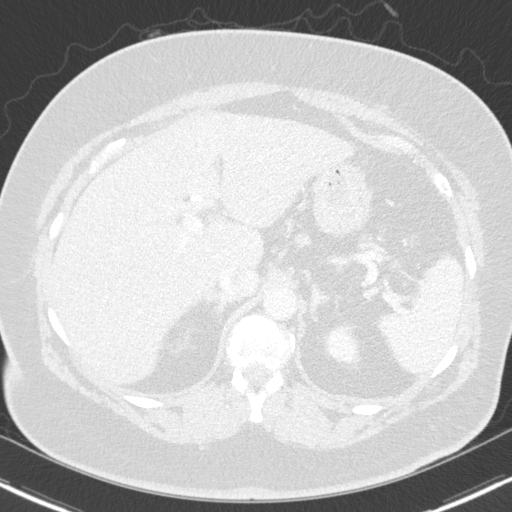
[im 20/132  lung]
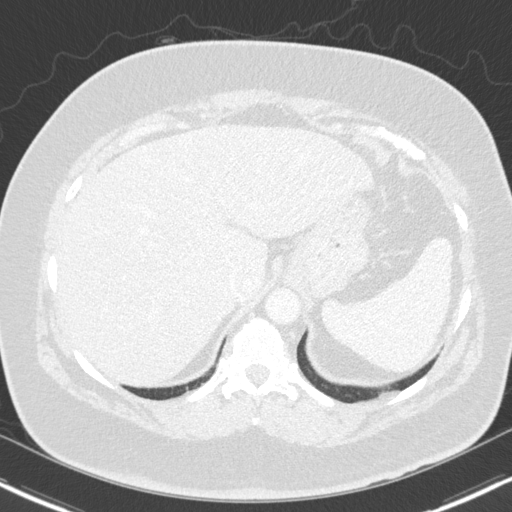
[im 30/132  lung]
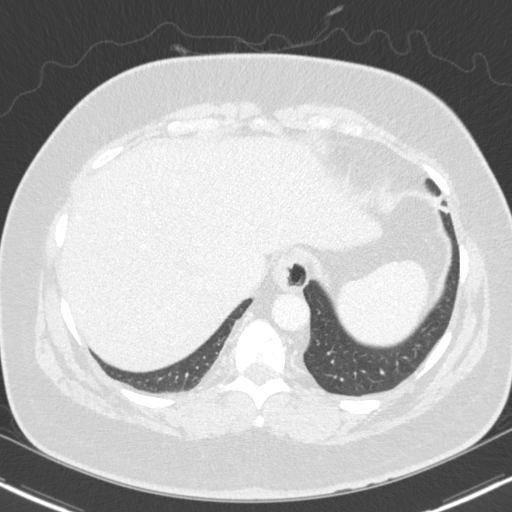
[im 39/132  lung]
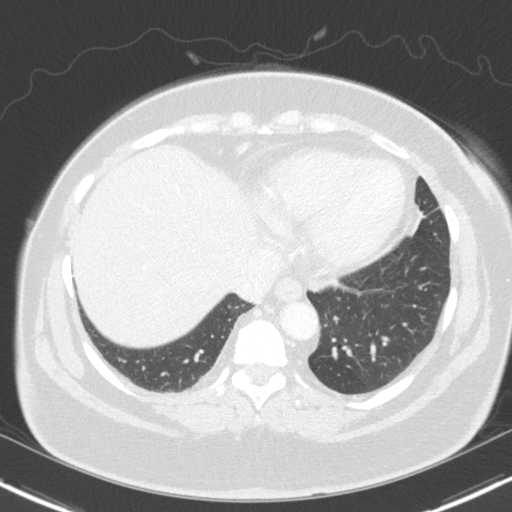
[im 49/132  mediastinal]
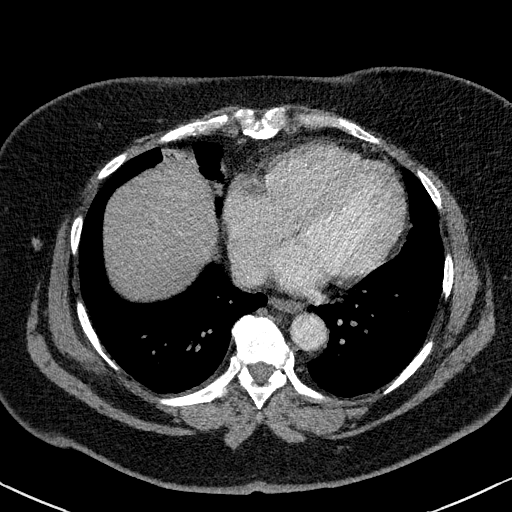
[im 49/132  lung]
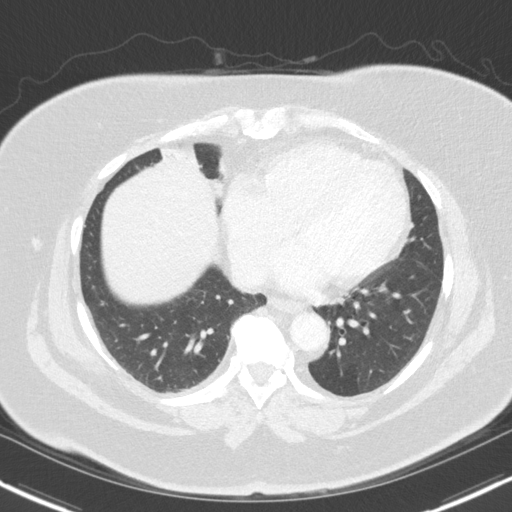
[im 59/132  lung]
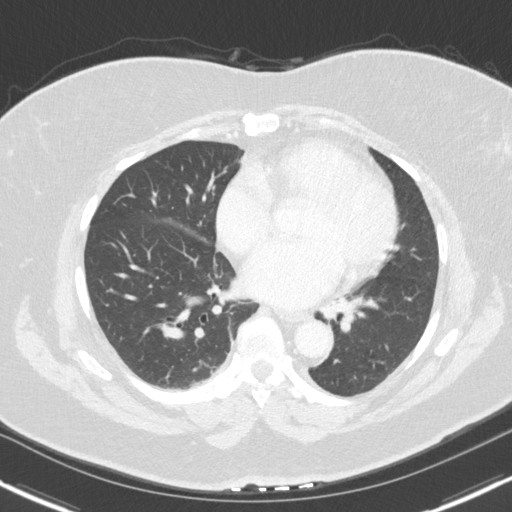
[im 73/132  lung]
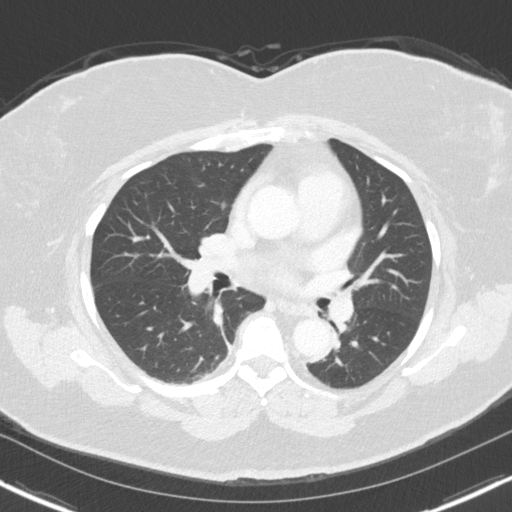
[im 83/132  lung]
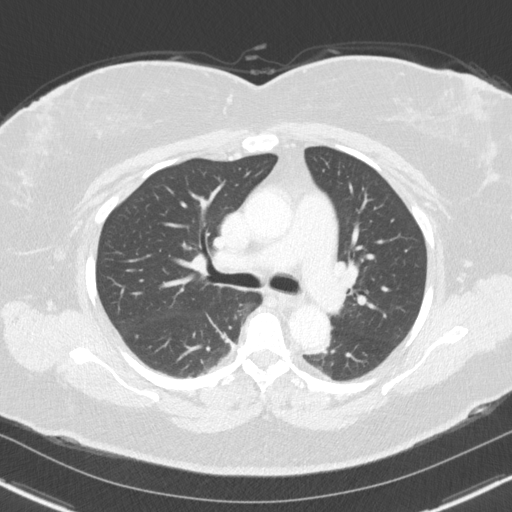
[im 93/132  mediastinal]
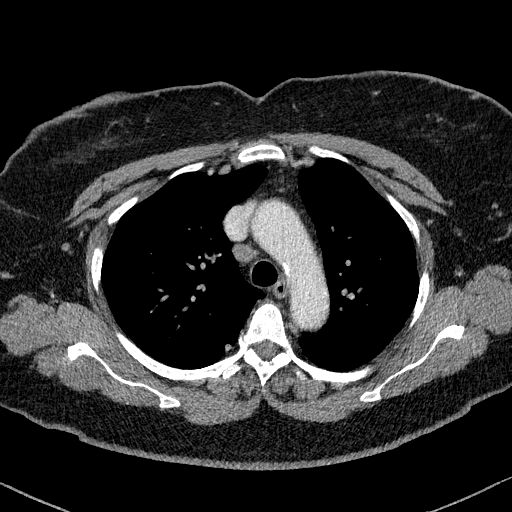
[im 93/132  lung]
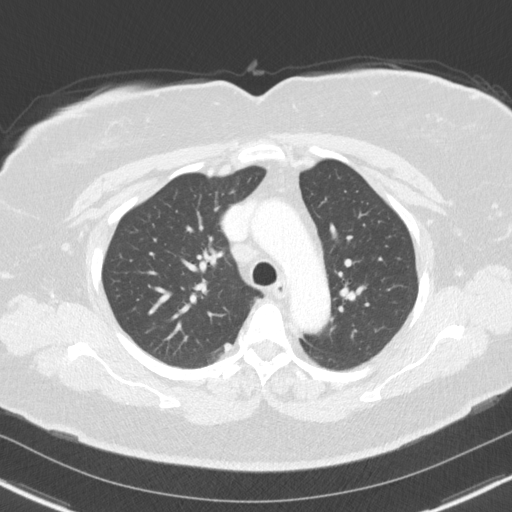
[im 102/132  lung]
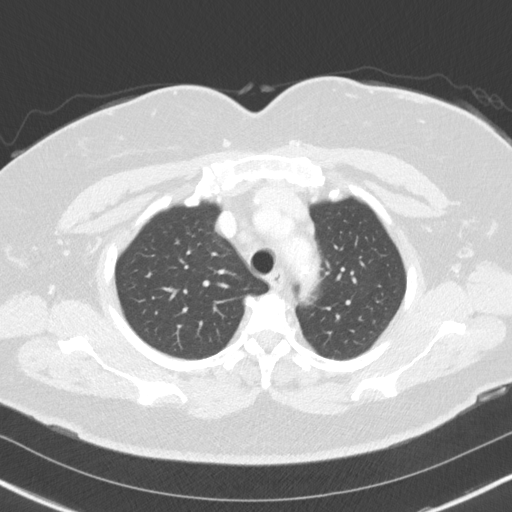
[im 112/132  lung]
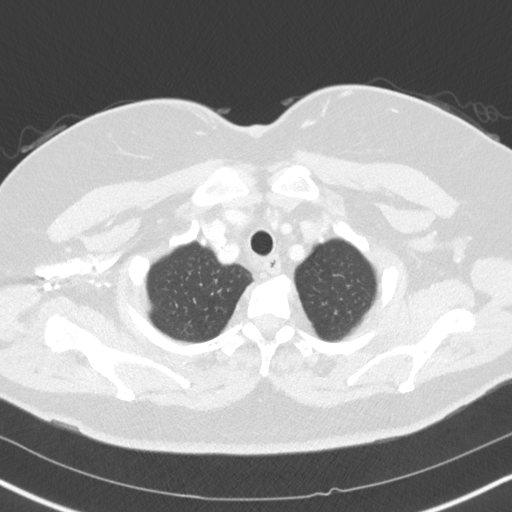
[im 122/132  lung]
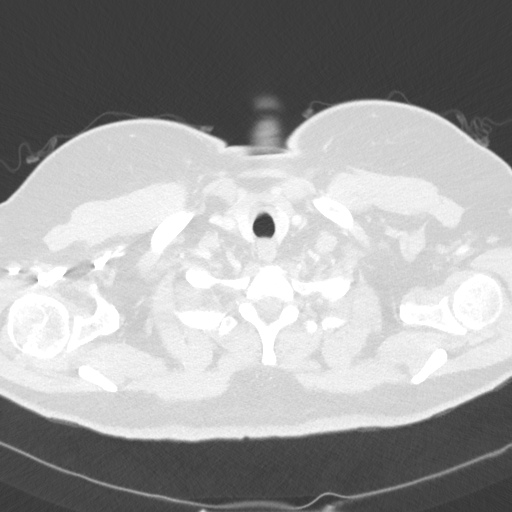

[Series 5: coronal · coronal · 0.59mm/px · 3 of 133 slices shown]
[im 27/133  lung]
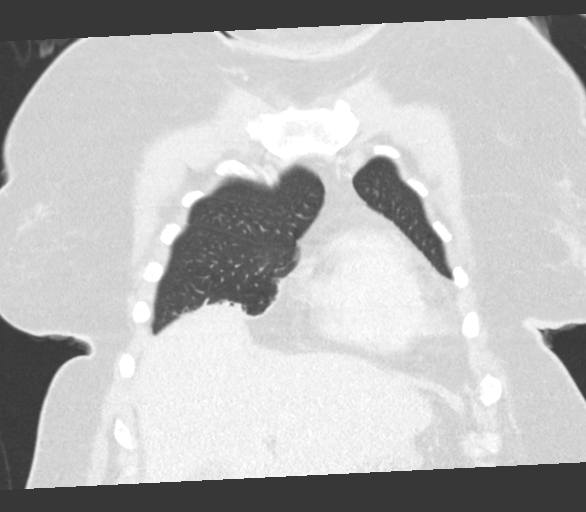
[im 53/133  lung]
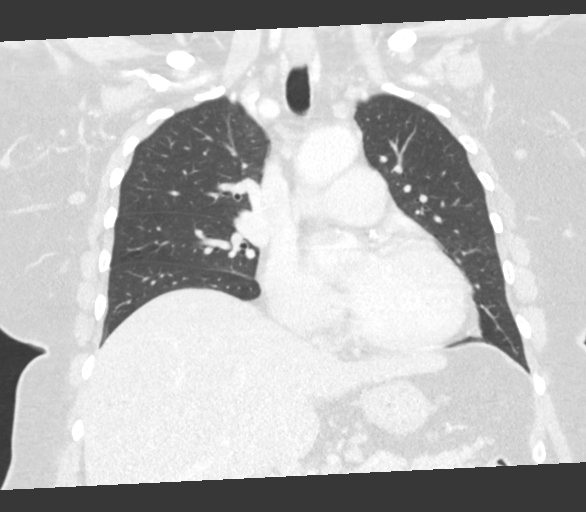
[im 80/133  lung]
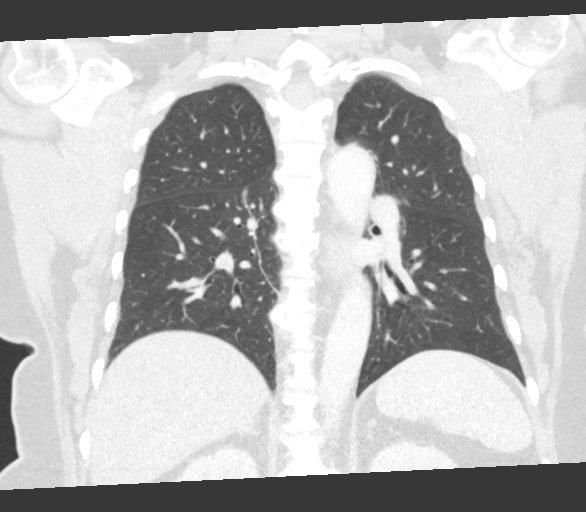

[15 of 36 positions shown; findings below may reference images not displayed]

FINDINGS: Cardiovascular: Aortic atherosclerosis. Normal heart size. Left
coronary artery calcifications. No pericardial effusion.

Mediastinum/Nodes: No enlarged mediastinal, hilar, or axillary lymph
nodes. Small hiatal hernia. Thyroid gland, trachea, and esophagus
demonstrate no significant findings.

Lungs/Pleura: Bandlike scarring of the dependent right lower lobe
(series 3, image 60). No pleural effusion or pneumothorax.

Upper Abdomen: Surgical clips and a partially imaged fluid
attenuation collection in the gallbladder fossa (series 2, image
132).

Musculoskeletal: No chest wall mass or suspicious bone lesions
identified.
IMPRESSION: 1. No evidence of metastatic disease in the chest.
2. Surgical clips and a partially imaged fluid attenuation
collection in the gallbladder fossa, in keeping with recent
postoperative findings of cholecystectomy. This collection is
nonspecific and may reflect hematoma, seroma, or biloma. Correlate
with forthcoming planned staging imaging of the abdomen and pelvis.
3. Coronary artery disease.  Aortic Atherosclerosis ([HP]-[HP]).

## 2020-05-31 MED ORDER — IOHEXOL 300 MG/ML  SOLN
75.0000 mL | Freq: Once | INTRAMUSCULAR | Status: AC | PRN
  Administered 2020-05-31: 75 mL via INTRAVENOUS

## 2020-05-31 NOTE — Telephone Encounter (Signed)
Faxed information to [facility] pathology. Per [employer] requests that provider send this directly to pathology dept.

## 2020-05-31 NOTE — Telephone Encounter (Signed)
-----   Message from [place] sent at 05/30/2020  1:24 PM EST ----- Regarding: [place] Team,   [facility] scanned to media tab for review.  Thanks, [employer]

## 2020-06-01 ENCOUNTER — Other Ambulatory Visit: Payer: Self-pay

## 2020-06-01 ENCOUNTER — Ambulatory Visit (INDEPENDENT_AMBULATORY_CARE_PROVIDER_SITE_OTHER): Payer: Medicare Other | Admitting: Physician Assistant

## 2020-06-01 ENCOUNTER — Encounter: Payer: Self-pay | Admitting: Physician Assistant

## 2020-06-01 VITALS — BP 147/83 | HR 98 | Temp 99.0°F | Resp 12 | Ht 66.0 in | Wt 212.0 lb

## 2020-06-01 DIAGNOSIS — Z9049 Acquired absence of other specified parts of digestive tract: Secondary | ICD-10-CM

## 2020-06-01 DIAGNOSIS — C23 Malignant neoplasm of gallbladder: Secondary | ICD-10-CM

## 2020-06-01 DIAGNOSIS — Z09 Encounter for follow-up examination after completed treatment for conditions other than malignant neoplasm: Secondary | ICD-10-CM

## 2020-06-01 NOTE — Patient Instructions (Addendum)
Otho Ket, PA-C discussed with patient that if diarrhea worsen, she may try over counter Imodium.  Patient is to refrain from any heavy lifting, bending, pushing or pulling for a total of 6 weeks after surgery. Patient advised to take Tylenol or Ibuprofen for any discomfort or pain management. Patient advised to follow up with [facility] after scheduled MRI scan. Follow-up with our office as needed. Please call and ask to speak with a nurse if you develop questions or concerns.   GENERAL POST-OPERATIVE PATIENT INSTRUCTIONS   WOUND CARE INSTRUCTIONS:  Keep a dry clean dressing on the wound if there is drainage. The initial bandage may be removed after 24 hours.  Once the wound has quit draining you may leave it open to air.  If clothing rubs against the wound or causes irritation and the wound is not draining you may cover it with a dry dressing during the daytime.  Try to keep the wound dry and avoid ointments on the wound unless directed to do so.  If the wound becomes bright red and painful or starts to drain infected material that is not clear, please contact your physician immediately.  If the wound is mildly pink and has a thick firm ridge underneath it, this is normal, and is referred to as a healing ridge.  This will resolve over the next 4-6 weeks.  BATHING: You may shower if you have been informed of this by your surgeon. However, Please do not submerge in a tub, hot tub, or pool until incisions are completely sealed or have been told by your surgeon that you may do so.  DIET:  You may eat any foods that you can tolerate.  It is a good idea to eat a high fiber diet and take in plenty of fluids to prevent constipation.  If you do become constipated you may want to take a mild laxative or take ducolax tablets on a daily basis until your bowel habits are regular.  Constipation can be very uncomfortable, along with straining, after recent surgery.  ACTIVITY:  You are encouraged to cough  and deep breath or use your incentive spirometer if you were given one, every 15-30 minutes when awake.  This will help prevent respiratory complications and low grade fevers post-operatively if you had a general anesthetic.  You may want to hug a pillow when coughing and sneezing to add additional support to the surgical area, if you had abdominal or chest surgery, which will decrease pain during these times.  You are encouraged to walk and engage in light activity for the next two weeks.  You should not lift more than 20 pounds, until 4 to 6 weeks after surgery as it could put you at increased risk for complications.  Twenty pounds is roughly equivalent to a plastic bag of groceries. At that time- Listen to your body when lifting, if you have pain when lifting, stop and then try again in a few days. Soreness after doing exercises or activities of daily living is normal as you get back in to your normal routine.  MEDICATIONS:  Try to take narcotic medications and anti-inflammatory medications, such as tylenol, ibuprofen, naprosyn, etc., with food.  This will minimize stomach upset from the medication.  Should you develop nausea and vomiting from the pain medication, or develop a rash, please discontinue the medication and contact your physician.  You should not drive, make important decisions, or operate machinery when taking narcotic pain medication.  SUNBLOCK Use sun block  to incision area over the next year if this area will be exposed to sun. This helps decrease scarring and will allow you avoid a permanent darkened area over your incision.  QUESTIONS:  Please feel free to call our office if you have any questions, and we will be glad to assist you. [phone].

## 2020-06-01 NOTE — Progress Notes (Signed)
[facility] POST-OP OFFICE VISIT  06/01/2020  HPI: Kristina Orozco is a 64 y.o. female 15 days s/p laparoscopic cholecystectomy for acute on chronic cholecystitis with dr Celine Ahr and was incidentally found to have adenocarcinoma of the the gallbladder with involvement of the liver parenchyma. She was seen in follow up yesterday (11/08) with Dr Barry Dienes in [place]. Reportedly plan for more imaging and MRI next week prior to determining treatment course.   Overall, she is doing well. Some incisional soreness. No fever, chills, nausea, or emesis. She does endorse sporadic diarrhea. No issues with PO intake. Incisions well healed. No other complaints.   Vital signs: BP (!) 147/83   Pulse 98   Temp 99 F (37.2 C) (Oral)   Resp 12   Ht 5\' 6"  (1.676 m)   Wt 212 lb (96.2 kg)   SpO2 97%   BMI 34.22 kg/m    Physical Exam: Constitutional: Well appearing female, NAD Abdomen: Soft, no appreciable tenderness, non-distended, no rebound/guarding Skin: Laparoscopic incisions are well healed, no erythema or drainage   Assessment/Plan: This is a 64 y.o. female 15 days s/p laparoscopic cholecystectomy for acute on chronic cholecystitis and was incidentally found to have adenocarcinoma of the the gallbladder with involvement of the liver parenchyma   - Continue to follow up with Dr Barry Dienes as scheduled  - Complete work up with oncology  - Pain control prn  - Imodium prn for diarrhea  - Reviewed lifting restrictions   - rtc with [facility] prn  -- Edison Simon, PA-C [facility] 06/01/2020, 10:57 AM [phone] M-F: 7am - 4pm

## 2020-06-09 ENCOUNTER — Other Ambulatory Visit: Payer: Self-pay

## 2020-06-09 ENCOUNTER — Inpatient Hospital Stay (HOSPITAL_BASED_OUTPATIENT_CLINIC_OR_DEPARTMENT_OTHER): Payer: Medicare Other | Admitting: Hospice and Palliative Medicine

## 2020-06-09 ENCOUNTER — Ambulatory Visit
Admission: RE | Admit: 2020-06-09 | Discharge: 2020-06-09 | Disposition: A | Discharge status: Home / Self Care | Payer: Medicare Other | Source: Ambulatory Visit | Attending: Internal Medicine | Admitting: Internal Medicine

## 2020-06-09 DIAGNOSIS — C23 Malignant neoplasm of gallbladder: Secondary | ICD-10-CM

## 2020-06-09 IMAGING — MR MR ABDOMEN WO/W CM
16 series · 48 of 48 positions shown · IV contrast (gadavist)
Comparison: [DATE] abdominal ultrasound chest CT [DATE].

CLINICAL DATA: Recent cholecystectomy demonstrating gallbladder
carcinoma.

EXAM:
MRI ABDOMEN WITHOUT AND WITH CONTRAST
TECHNIQUE: Multiplanar multisequence MR imaging of the abdomen was performed
both before and after the administration of intravenous contrast.
CONTRAST:  9mL GADAVIST GADOBUTROL 1 MMOL/ML IV SOLN

[Series 3: T2 · coronal · 6.0mm · 1.19mm/px · 1 of 33 slices shown (1 of 2)]
[im 1/33]
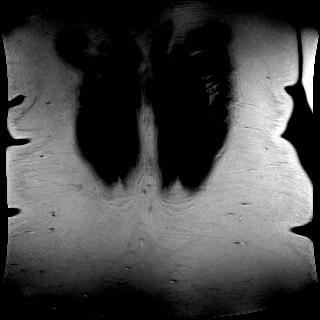

[Series 4: T2 · axial · 6.0mm · 1.25mm/px · 1 of 40 slices shown (2 of 2)]
[im 1/40]
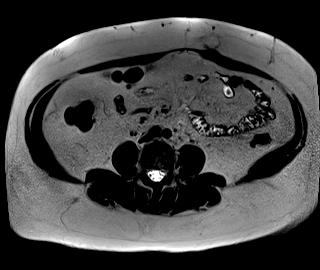

[Series 6: T2 fat-sat · axial · 6.0mm · 1.25mm/px · 1 of 38 slices shown]
[im 1/38]
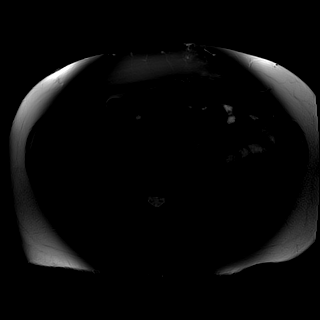

[Series 7: ax dwi_tracew · axial · 6.0mm · 1.49mm/px · z∈[+47,+313]mm · 4 of 114 slices shown]
[im 1/114]
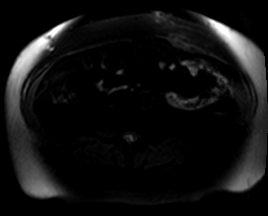
[im 38/114]
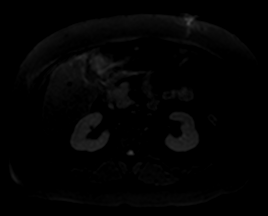
[im 76/114]
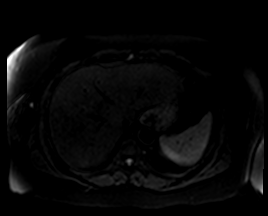
[im 114/114]
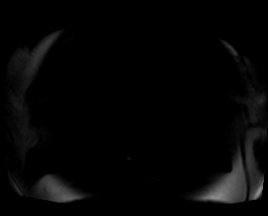

[Series 8: ax dwi_adc · axial · 6.0mm · 1.49mm/px · 1 of 38 slices shown]
[im 1/38]
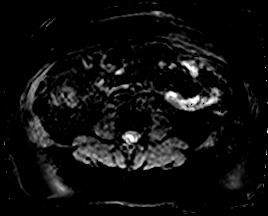

[Series 9: bSSFP · axial · 6.0mm · 0.78mm/px · 1 of 36 slices shown]
[im 1/36]
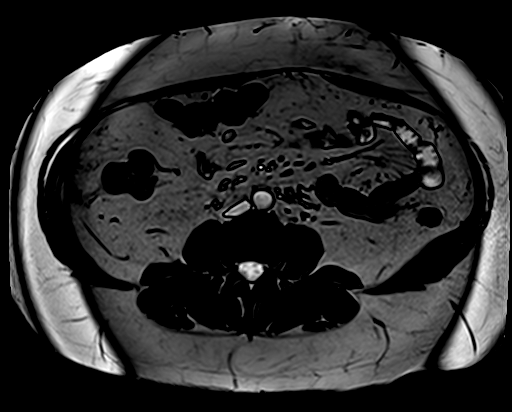

[Series 11: T1 dynamic fat-sat · axial · non-contrast · 3.5mm · 1.25mm/px · z∈[+42,+318]mm · 4 of 80 slices shown (1 of 5)]
[im 1/80]
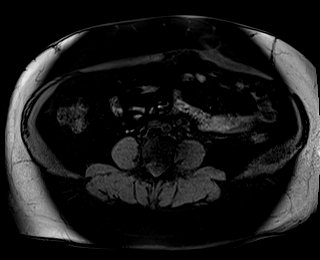
[im 27/80]
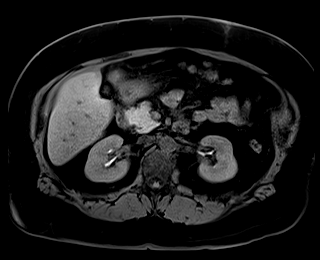
[im 53/80]
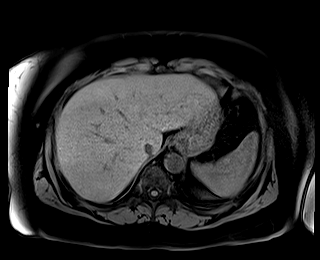
[im 80/80]
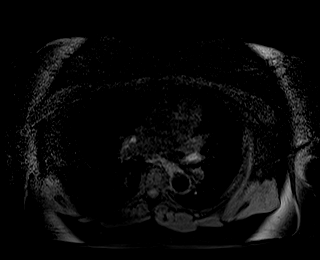

[Series 12: T1 dynamic fat-sat post-contrast · axial · 3.5mm · 1.25mm/px · z∈[+42,+318]mm · 4 of 80 slices shown (1 of 4)]
[im 1/80]
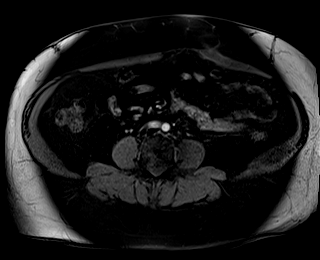
[im 27/80]
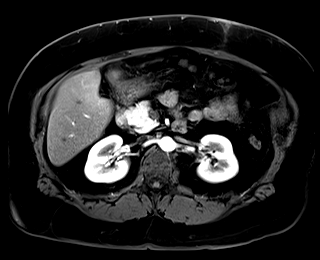
[im 53/80]
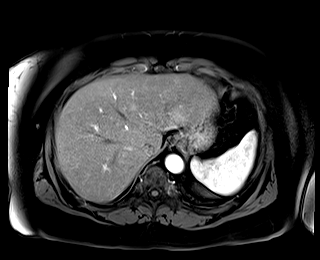
[im 80/80]
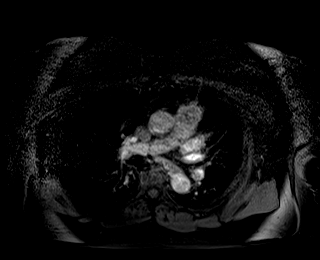

[Series 13: T1 dynamic fat-sat · axial · 3.5mm · 1.25mm/px · z∈[+42,+318]mm · 4 of 80 slices shown (2 of 5)]
[im 1/80]
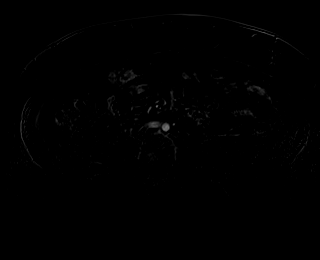
[im 27/80]
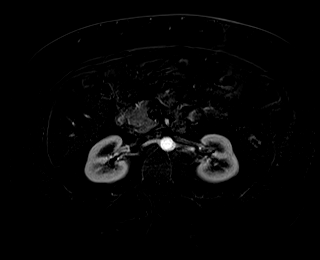
[im 53/80]
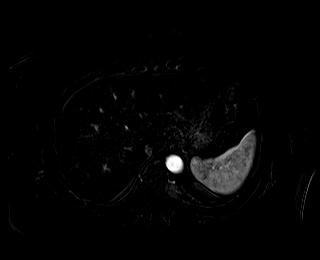
[im 80/80]
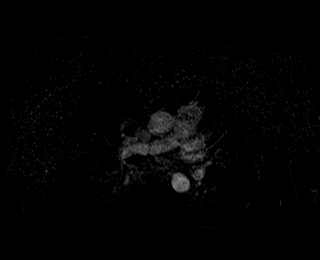

[Series 14: T1 dynamic fat-sat post-contrast · axial · 3.5mm · 1.25mm/px · z∈[+42,+318]mm · 4 of 80 slices shown (2 of 4)]
[im 1/80]
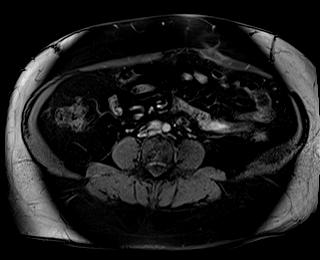
[im 27/80]
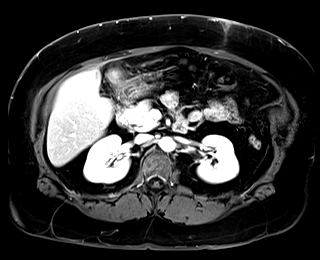
[im 53/80]
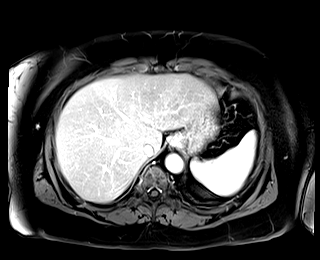
[im 80/80]
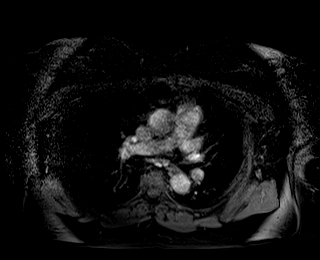

[Series 15: T1 dynamic fat-sat · axial · 3.5mm · 1.25mm/px · z∈[+42,+318]mm · 4 of 80 slices shown (3 of 5)]
[im 1/80]
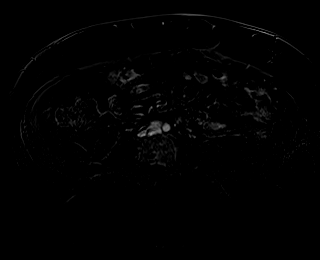
[im 27/80]
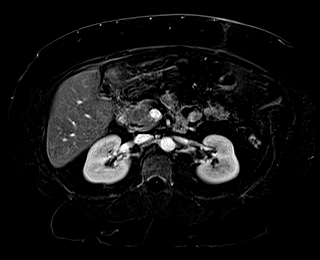
[im 53/80]
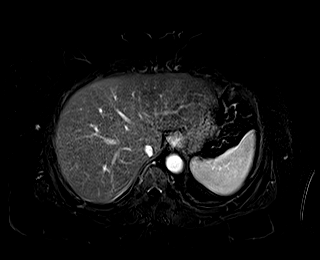
[im 80/80]
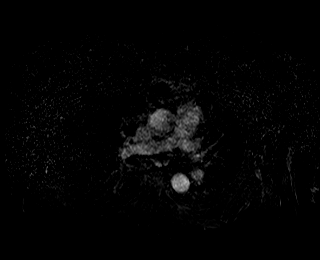

[Series 16: T1 dynamic fat-sat post-contrast · axial · 3.5mm · 1.25mm/px · z∈[+42,+318]mm · 4 of 80 slices shown (3 of 4)]
[im 1/80]
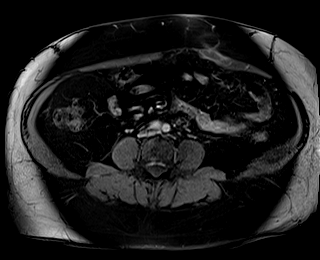
[im 27/80]
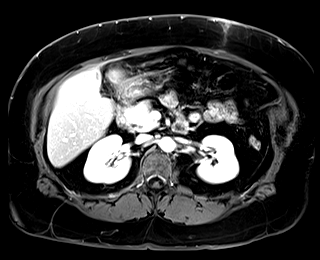
[im 53/80]
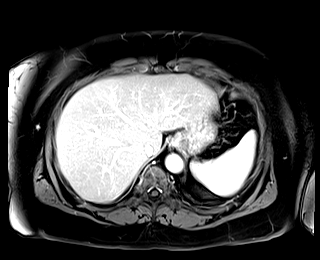
[im 80/80]
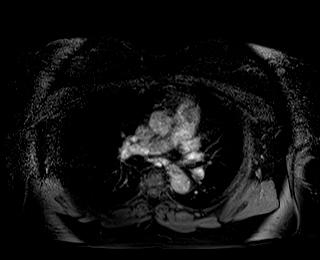

[Series 17: T1 dynamic fat-sat · axial · 3.5mm · 1.25mm/px · z∈[+42,+318]mm · 4 of 80 slices shown (4 of 5)]
[im 1/80]
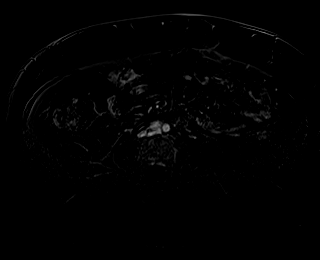
[im 27/80]
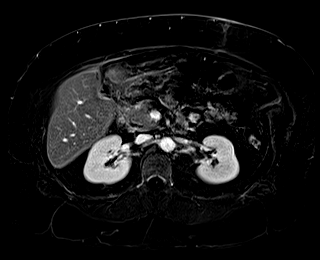
[im 53/80]
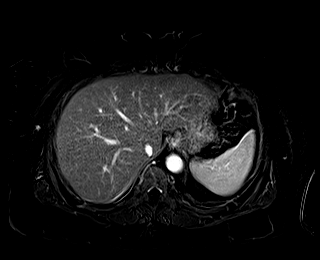
[im 80/80]
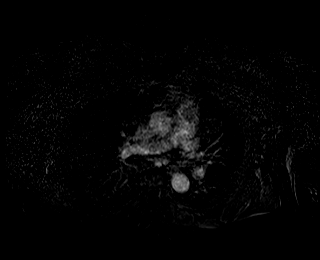

[Series 18: T1 dynamic post-contrast · coronal · 3.5mm · 1.31mm/px · 3 of 64 slices shown]
[im 1/64]
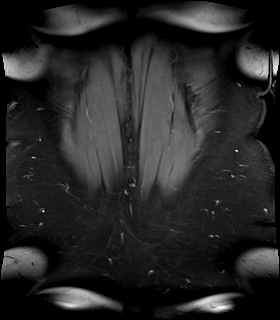
[im 32/64]
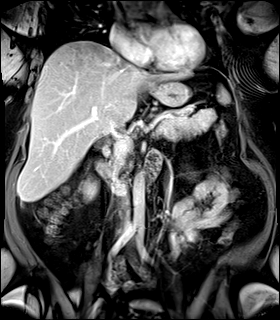
[im 64/64]
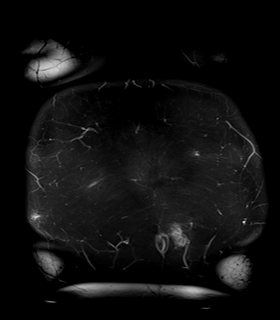

[Series 19: T1 dynamic fat-sat post-contrast · axial · 3.5mm · 1.25mm/px · z∈[+42,+318]mm · 4 of 80 slices shown (4 of 4)]
[im 1/80]
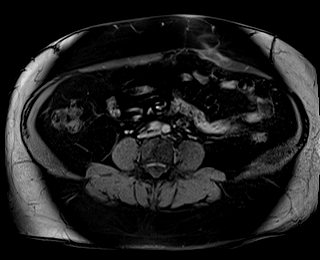
[im 27/80]
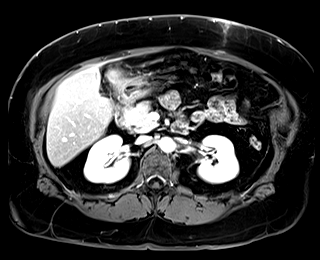
[im 53/80]
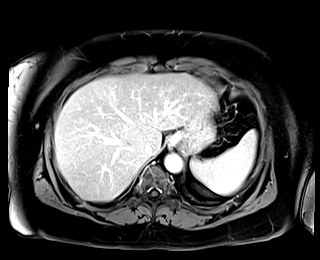
[im 80/80]
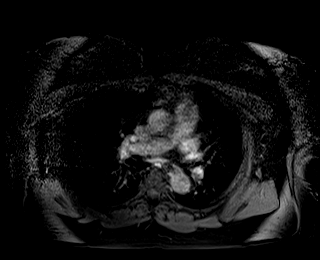

[Series 20: T1 dynamic fat-sat · axial · 3.5mm · 1.25mm/px · z∈[+42,+318]mm · 4 of 80 slices shown (5 of 5)]
[im 1/80]
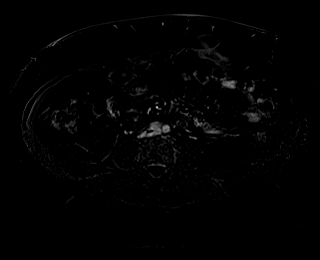
[im 27/80]
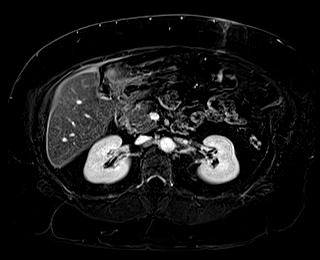
[im 53/80]
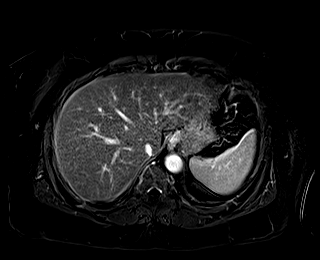
[im 80/80]
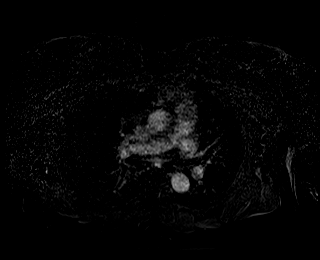

[48 of 48 positions shown; findings below may reference images not displayed]

FINDINGS: Lower chest: Normal heart size without pericardial or pleural
effusion.

Hepatobiliary: Cholecystectomy, without biliary duct dilatation or
choledocholithiasis.

Complex fluid collection within the operative bed including at 2.8 x
1.8 cm on [DATE]. This is decreased compared to [DATE] chest CT
(where it was incompletely imaged on that exam).

Multiple foci of mild T2 hyperintensity are identified about the
operative bed within the medial left and less so anterior right
hepatic lobes. The most well-defined are identified just the left of
the fluid collection at 1.3 cm on 49/16 and just to the right of the
anterior portion of the postoperative collection at 1.3 cm on 52/16.
Correlate restricted diffusion, especially of the latter lesion on
98/7.

Along the capsule of the right hepatic lobe, there are small foci of
T2 hypointensity and hypoenhancement. Example at 6 mm on 39/16 and
13 mm on 68/16.

Pancreas:  Normal, without mass or ductal dilatation.

Spleen:  Normal in size, without focal abnormality.

Adrenals/Urinary Tract: Normal adrenal glands. Left larger than
right renal cysts. No hydronephrosis.

Stomach/Bowel: Tiny hiatal hernia.  Normal abdominal bowel loops.

Vascular/Lymphatic: Aortic atherosclerosis. Patent portal hepatic
veins. Prominent porta hepatis nodes, including at 1.0 cm on [DATE]
and 1.1 cm on [DATE].

Other: Trace right abdominal ascites including on [DATE]. Smooth
peritoneal thickening involving the right-side of the abdomen.

Subtle focus of hyperenhancement within the right rectus musculature
measures 1.0 cm on 71/16.

Right abdominal subcutaneous edema is likely the site of a
laparoscopic port.

Musculoskeletal: No acute osseous abnormality.
IMPRESSION: 1. Status post cholecystectomy with complex fluid collection in the
operative bed. Considerations remain biloma, seroma, or abscess.
This is incompletely imaged on [DATE] chest CT, but decreased
since that exam.
2. Pericholecystic foci of hepatic T2 mild hyperintensity and
hypoenhancement. Suspicious for metastatic disease given clinical
history of gallbladder carcinoma. This can serve as a baseline for
follow-up exams.
3. Foci of T2 hypointensity and hypoenhancement along the right
hepatic capsule, suspicious for dropped stones.
4. Trace ascites and mild right abdominal peritoneal thickening are
likely postoperative. A subtle focus of right rectus muscular
hyperenhancement is nonspecific, warranting attention for muscular
metastasis.
5. Prominent but not pathologically sized porta hepatis nodes,
indeterminate

## 2020-06-09 MED ORDER — GADOBUTROL 1 MMOL/ML IV SOLN
9.0000 mL | Freq: Once | INTRAVENOUS | Status: AC | PRN
  Administered 2020-06-09: 9 mL via INTRAVENOUS

## 2020-06-09 NOTE — Progress Notes (Signed)
Multidisciplinary Oncology Council Documentation  Kristina Orozco was presented by our [facility] on 06/09/2020, which included representatives from:  . Palliative Care . Dietitian . Physical/Occupational Therapist . Speech Therapist . Survivorship Nurse . Nurse Navigator . 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 The [facility] considered available treatment options and made the following recommendations/referrals: Orders Placed This Encounter  Procedures  . Amb Referral to Nutrition and Diabetic Education  . Ambulatory referral to [place] is a meeting of clinicians from various specialty areas who evaluate and discuss patients for whom a multidisciplinary approach is being considered. Final determinations in the plan of care are those of the provider(s).   Today's extended care, comprehensive team conference, Kristina Orozco was not present for the discussion and was not examined.

## 2020-06-10 ENCOUNTER — Other Ambulatory Visit: Payer: Medicare Other

## 2020-06-10 ENCOUNTER — Inpatient Hospital Stay: Payer: Medicare Other

## 2020-06-10 ENCOUNTER — Encounter: Payer: Self-pay | Admitting: Internal Medicine

## 2020-06-10 ENCOUNTER — Inpatient Hospital Stay (HOSPITAL_BASED_OUTPATIENT_CLINIC_OR_DEPARTMENT_OTHER): Payer: Medicare Other | Admitting: Internal Medicine

## 2020-06-10 DIAGNOSIS — C23 Malignant neoplasm of gallbladder: Secondary | ICD-10-CM

## 2020-06-10 LAB — CBC WITH DIFFERENTIAL/PLATELET
Abs Immature Granulocytes: 0.02 10*3/uL (ref 0.00–0.07)
Basophils Absolute: 0.1 10*3/uL (ref 0.0–0.1)
Basophils Relative: 1 %
Eosinophils Absolute: 0.1 10*3/uL (ref 0.0–0.5)
Eosinophils Relative: 2 %
HCT: 37 % (ref 36.0–46.0)
Hemoglobin: 11.9 g/dL — ABNORMAL LOW (ref 12.0–15.0)
Immature Granulocytes: 0 %
Lymphocytes Relative: 43 %
Lymphs Abs: 3.1 10*3/uL (ref 0.7–4.0)
MCH: 27 pg (ref 26.0–34.0)
MCHC: 32.2 g/dL (ref 30.0–36.0)
MCV: 84.1 fL (ref 80.0–100.0)
Monocytes Absolute: 0.9 10*3/uL (ref 0.1–1.0)
Monocytes Relative: 13 %
Neutro Abs: 3 10*3/uL (ref 1.7–7.7)
Neutrophils Relative %: 41 %
Platelets: 332 10*3/uL (ref 150–400)
RBC: 4.4 MIL/uL (ref 3.87–5.11)
RDW: 13.7 % (ref 11.5–15.5)
WBC: 7.3 10*3/uL (ref 4.0–10.5)
nRBC: 0 % (ref 0.0–0.2)

## 2020-06-10 LAB — COMPREHENSIVE METABOLIC PANEL
ALT: 23 U/L (ref 0–44)
AST: 35 U/L (ref 15–41)
Albumin: 3.8 g/dL (ref 3.5–5.0)
Alkaline Phosphatase: 62 U/L (ref 38–126)
Anion gap: 11 (ref 5–15)
BUN: 12 mg/dL (ref 8–23)
CO2: 27 mmol/L (ref 22–32)
Calcium: 8.9 mg/dL (ref 8.9–10.3)
Chloride: 98 mmol/L (ref 98–111)
Creatinine, Ser: 0.64 mg/dL (ref 0.44–1.00)
GFR, Estimated: >60 mL/min (ref >60)
Glucose, Bld: 213 mg/dL — ABNORMAL HIGH (ref 70–99)
Potassium: 3.2 mmol/L — ABNORMAL LOW (ref 3.5–5.1)
Sodium: 136 mmol/L (ref 135–145)
Total Bilirubin: 0.5 mg/dL (ref 0.3–1.2)
Total Protein: 7.5 g/dL (ref 6.5–8.1)

## 2020-06-10 NOTE — Assessment & Plan Note (Addendum)
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  Awaiting definitive surgery.  CT chest negative for any metastatic disease.  MRI abdomen with and without contrast-residual disease noted gallbladder bed; no evidence of distant metastases or lymphadenopathy. NOv 3rd 2021-CA 19-9 > 2000; awaiting repeat tumor marker today. Check NGS/MMR.   #I had a long discussion the patient that her treatment plan includes definitive surgery and chemotherapy.  Discussed the 2 options: #1 neoadjuvant chemotherapy followed by definitive surgery; #2 definitive surgery followed by adjuvant chemotherapy.  Given the residual cancer noted on imaging; elevated tumor marker-my preference is neoadjuvant chemotherapy.  Patient is currently awaiting evaluation with Dr. Barry Dienes on 11/22.   #Discussed option of gemcitabine-cis-platinum chemotherapy for neoadjuvant basis.  Discussed that this is fairly intense chemotherapy discussed the potential side effects including but not limited to-increasing fatigue, nausea vomiting, diarrhea, hair loss, sores in the mouth, increase risk of infection and also neuropathy; hearing loss; renal failure.  Given the lack of significant comorbidities I think she should tolerate chemotherapy reasonably well.  Understand that she will need a port; and chemotherapy education prior to starting.  Will await to speak to Dr. Barry Dienes after the results of the tumor marker to decide on the definitive plan.  Also reviewed at the tumor conference.  # DISPOSITION: # Follow up TBD-Dr.B  # I reviewed the blood work- with the patient in detail; also reviewed the imaging independently [as summarized above]; and with the patient in detail.

## 2020-06-11 LAB — CA 19-9 (SERIAL): CA 19-9: 2227 U/mL — ABNORMAL HIGH (ref 0–35)

## 2020-06-11 NOTE — Progress Notes (Signed)
Tumor Board Documentation  Kristina Orozco was presented by Dr Rogue Bussing at our Tumor Board on 06/10/2020, which included representatives from medical oncology, radiation oncology, surgical oncology, internal medicine, navigation, pathology, radiology, surgical, pharmacy, genetics, research, palliative care, pulmonology.  Kristina Orozco currently presents as a new patient, for discussion with history of the following treatments: active survellience, surgical intervention(s).  Additionally, we reviewed previous medical and familial history, history of present illness, and recent lab results along with all available histopathologic and imaging studies. The tumor board considered available treatment options and made the following recommendations: Surgery, Neoadjuvant chemotherapy, Adjuvant chemotherapy (Evaluation with Dr Barry Dienes and decide if pt to get surgery then chemo vs chemo then surgery)    The following procedures/referrals were also placed: No orders of the defined types were placed in this encounter.   Clinical Trial Status: not discussed   Staging used: Pathologic Stage  AJCC Staging: T: pT3 N: pN1   Group: Stage III Adenocarcinoma of Gallbladder   National site-specific guidelines NCCN were discussed with respect to the case.  Tumor board is a meeting of clinicians from various specialty areas who evaluate and discuss patients for whom a multidisciplinary approach is being considered. Final determinations in the plan of care are those of the provider(s). The responsibility for follow up of recommendations given during tumor board is that of the provider.   Today's extended care, comprehensive team conference, Kristina Orozco was not present for the discussion and was not examined.   Multidisciplinary Tumor Board is a multidisciplinary case peer review process.  Decisions discussed in the Multidisciplinary Tumor Board reflect the opinions of the specialists present at the conference without  having examined the patient.  Ultimately, treatment and diagnostic decisions rest with the primary provider(s) and the patient.

## 2020-06-11 NOTE — Progress Notes (Signed)
[place] CONSULT NOTE  Patient Care Team: Revelo, Elyse Jarvis, MD as PCP - General (Family Medicine) Rico Junker, RN as Registered Nurse Christene Lye, MD (General Surgery) Clent Jacks, RN as Oncology Nurse Navigator  CHIEF COMPLAINTS/PURPOSE OF CONSULTATION: GALLBLADDER CANCER   #  Oncology History Overview Note  10/25- GALLBLADDER CANCER- [Dr.Cannon]- laproscopic cholecystectomy- pT Category: pT3; pN Category: pN1  pM Category: Not applicable   SURGICAL PATHOLOGY  CASE: [id number]  PATIENT: Kristina Orozco  Surgical Pathology Report      Specimen Submitted:  A. Gallbladder   Clinical History: Cholecystitis.       DIAGNOSIS:  A. GALLBLADDER; CHOLECYSTECTOMY:  - ADENOCARCINOMA OF THE GALLBLADDER.  - CARCINOMA IN SITU.  - SEE CANCER SUMMARY BELOW.  - PERINEURAL INVASION IS PRESENT.  - ACUTE ON CHRONIC CHOLECYSTITIS WITH CHOLELITHIASIS.  - METASTATIC ADENOCARCINOMA INVOLVES CYSTIC DUCT LYMPH NODE (1/1).   CANCER CASE SUMMARY: GALLBLADDER  Standard(s): AJCC-UICC 8   SPECIMEN  Procedure: Simple cholecystectomy:   TUMOR  Tumor Site: Fundus  Histologic Type: Adenocarcinoma, biliary type  Histologic Grade: G3, poorly differentiated  Tumor Size: Greatest dimension: 2.2 cm  Tumor Extent: Tumor directly invades the liver  Lymphovascular Invasion: Present   MARGINS  Margin Status for Invasive Carcinoma:      Margin Involved by Invasive Carcinoma: Liver parenchymal / hepatic  bed  Margin Status for Intraepithelial Neoplasia:      All margins negative for high-grade intraepithelial neoplasia   REGIONAL LYMPH NODES  Regional Lymph Node Status: Tumor present in regional lymph node       Number of Lymph Nodes with Tumor: 1       Number of Lymph Nodes Examined: 1   DISTANT METASTASIS  Distant Site(s) Involved, if applicable: Not applicable   PATHOLOGIC STAGE CLASSIFICATION (pTNM, AJCC 8th Edition):  TNM Descriptors:  Not applicable  pT Category: pT3  Regional Lymph Nodes Modifier: Not applicable  pN Category: pN1  pM Category: Not applicable   # October 24PY 2021- Incidental gallbladder ADENOCA-s/p acute cholecystectomy [Dr.Cannon]. Stage III- T3N1; liver margin positive.  CT chest negative for any metastatic disease.  MRI abdomen with and without contrast-residual disease noted gallbladder bed; no evidence of distant metastases or lymphadenopathy. NOv 3rd 2021-CA 19-9 > 2000;  Check NGS/MMR.   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  female with new diagnosis of gallbladder cancer is here for follow-up/in the restaging imaging.  In the interim patient met with surgery to have a discussion regarding definitive resection.  States her abdominal pain is improving.  No nausea no vomiting.  Her appetite is picking up.  Review of Systems  Constitutional: Negative for chills, diaphoresis, fever and malaise/fatigue.  HENT: Negative for nosebleeds and sore throat.   Eyes: Negative for double vision.  Respiratory: Negative for cough, hemoptysis, sputum production, shortness of breath and wheezing.   Cardiovascular: Negative for chest pain, palpitations, orthopnea and leg swelling.  Gastrointestinal: Negative for abdominal pain, blood in stool, constipation, diarrhea, heartburn, melena, nausea and vomiting.  Genitourinary: Negative for dysuria, frequency and urgency.  Musculoskeletal: Negative for back pain and joint pain.  Skin: Negative.  Negative for itching and rash.  Neurological: Negative for dizziness, tingling, focal weakness, weakness and headaches.  Endo/Heme/Allergies: Does not bruise/bleed easily.  Psychiatric/Behavioral: Negative for  depression. The patient is not nervous/anxious and does not have insomnia.      MEDICAL HISTORY:  Past Medical History:  Diagnosis Date  . Cancer ([place])   . Diabetes mellitus without complication ([place])   . Hypertension     SURGICAL HISTORY: Past Surgical History:  Procedure Laterality Date  . ABDOMINAL HYSTERECTOMY    . APPENDECTOMY  2004  . BREAST BIOPSY Left 2011  . BREAST BIOPSY Right 08/26/13  . 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/28/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  . CHOLECYSTECTOMY N/A 05/17/2020   Procedure: LAPAROSCOPIC CHOLECYSTECTOMY;  Surgeon: Fredirick Maudlin, MD;  Location: [facility] ORS;  Service: General;  Laterality: N/A;  . COLONOSCOPY WITH PROPOFOL N/A 01/31/2018   Procedure: COLONOSCOPY WITH PROPOFOL;  Surgeon: Jonathon Bellows, MD;  Location: [facility] ENDOSCOPY;  Service: Gastroenterology;  Laterality: N/A;    SOCIAL HISTORY: Social History   Socioeconomic History  . Marital status: Single    Spouse name: Not on file  . Number of children: Not on file  . Years of education: Not on file  . Highest education level: Not on file  Occupational History  . Not on file  Tobacco Use  . Smoking status: Never Smoker  . Smokeless tobacco: Never Used  Substance and Sexual Activity  . Alcohol use: Yes  . Drug use: No  . Sexual activity: Not on file  Other Topics Concern  . Not on file  Social History Narrative   Lives in [place]; with son. 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 Never smoked; beer/iqour over weekends.    Social Determinants of Health   Financial Resource Strain:   . Difficulty of Paying Living Expenses: Not on file  Food Insecurity:   . Worried About Charity fundraiser in the Last Year: Not on file  . Ran Out of Food in the Last Year: Not on file  Transportation Needs:   . Lack of Transportation (Medical): Not on file  . Lack of Transportation (Non-Medical): Not on file  Physical Activity:   . Days of Exercise per Week: Not on file  . Minutes of  Exercise per Session: Not on file  Stress:   . Feeling of Stress : Not on file  Social Connections:   . Frequency of Communication with Friends and Family: Not on file  . Frequency of Social Gatherings with Friends and Family: Not on file  . Attends Religious Services: Not on file  . Active Member of Clubs or Organizations: Not on file  . Attends Archivist Meetings: Not on file  . Marital Status: Not on file  Intimate Partner Violence:   . Fear of Current or Ex-Partner: Not on file  . Emotionally Abused: Not on file  . Physically Abused: Not on file  . Sexually Abused: Not on file    FAMILY HISTORY: Family History  Problem Relation Age of Onset  . Pancreatic cancer Mother        AT 76 years  . Breast cancer Sister        appx-55 years  . Pancreatic cancer Maternal Grandmother     ALLERGIES:  has No Known Allergies.  MEDICATIONS:  Current Outpatient Medications  Medication Sig Dispense Refill  . hydrochlorothiazide (HYDRODIURIL) 25 MG tablet Take 25 mg by mouth daily.    . Multiple Vitamins-Minerals (MULTIVITAMIN WITH MINERALS) tablet Take 1  tablet by mouth daily.    . pravastatin (PRAVACHOL) 10 MG tablet Take 10 mg by mouth at bedtime.    . calcium acetate (PHOSLO) 667 MG capsule Take by mouth 3 (three) times daily with meals. (Patient not taking: Reported on 06/10/2020)     No current facility-administered medications for this visit.      Marland Kitchen  PHYSICAL EXAMINATION: ECOG PERFORMANCE STATUS: 0 - Asymptomatic  Vitals:   06/10/20 1043  BP: (!) 143/74  Pulse: 88  Resp: 18  Temp: 98.6 F (37 C)  SpO2: 99%   Filed Weights   06/10/20 1043  Weight: 211 lb (95.7 kg)    Physical Exam HENT:     Head: Normocephalic and atraumatic.     Mouth/Throat:     Pharynx: No oropharyngeal exudate.  Eyes:     Pupils: Pupils are equal, round, and reactive to light.  Cardiovascular:     Rate and Rhythm: Normal rate and regular rhythm.  Pulmonary:     Effort:  Pulmonary effort is normal. No respiratory distress.     Breath sounds: Normal breath sounds. No wheezing.  Abdominal:     General: Bowel sounds are normal. There is no distension.     Palpations: Abdomen is soft. There is no mass.     Tenderness: There is no abdominal tenderness. There is no guarding or rebound.  Musculoskeletal:        General: No tenderness. Normal range of motion.     Cervical back: Normal range of motion and neck supple.  Skin:    General: Skin is warm.  Neurological:     Mental Status: She is alert and oriented to person, place, and time.  Psychiatric:        Mood and Affect: Affect normal.      LABORATORY DATA:  I have reviewed the data as listed Lab Results  Component Value Date   WBC 7.3 06/10/2020   HGB 11.9 (L) 06/10/2020   HCT 37.0 06/10/2020   MCV 84.1 06/10/2020   PLT 332 06/10/2020   Recent Labs    05/17/20 0340 05/26/20 1231 06/10/20 1018  NA 138 140 136  K 3.2* 3.3* 3.2*  CL 97* 96* 98  CO2 30 33* 27  GLUCOSE 141* 135* 213*  BUN _0 CREATININE 0.68 0.48 0.64  CALCIUM 9.2 9.5 8.9  GFRNONAA >60 >60 >60  PROT 7.5 8.2* 7.5  ALBUMIN 4.1 3.5 3.8  AST 24 31 35  ALT _1 ALKPHOS 48 106 62  BILITOT 1.1 0.9 0.5    RADIOGRAPHIC STUDIES: I have personally reviewed the radiological images as listed and agreed with the findings in the report. CT Chest W Contrast  Result Date: 05/31/2020 CLINICAL DATA:  Chest staging, recent diagnosis of gallbladder adenocarcinoma status post cholecystectomy. EXAM: CT CHEST WITH CONTRAST TECHNIQUE: Multidetector CT imaging of the chest was performed during intravenous contrast administration. CONTRAST:  38m OMNIPAQUE IOHEXOL 300 MG/ML  SOLN COMPARISON:  None. FINDINGS: Cardiovascular: Aortic atherosclerosis. Normal heart size. Left coronary artery calcifications. No pericardial effusion. Mediastinum/Nodes: No enlarged mediastinal, hilar, or axillary lymph nodes. Small hiatal hernia. Thyroid gland,  trachea, and esophagus demonstrate no significant findings. Lungs/Pleura: Bandlike scarring of the dependent right lower lobe (series 3, image 60). No pleural effusion or pneumothorax. Upper Abdomen: Surgical clips and a partially imaged fluid attenuation collection in the gallbladder fossa (series 2, image 132). Musculoskeletal: No chest wall mass or suspicious bone lesions identified. IMPRESSION: 1. No evidence  of metastatic disease in the chest. 2. Surgical clips and a partially imaged fluid attenuation collection in the gallbladder fossa, in keeping with recent postoperative findings of cholecystectomy. This collection is nonspecific and may reflect hematoma, seroma, or biloma. Correlate with forthcoming planned staging imaging of the abdomen and pelvis. 3. Coronary artery disease.  Aortic Atherosclerosis (ICD10-I70.0). Electronically Signed   By: Eddie Candle M.D.   On: 05/31/2020 15:07   MR Abdomen W Wo Contrast  Result Date: 06/10/2020 CLINICAL DATA:  Recent cholecystectomy demonstrating gallbladder carcinoma. EXAM: MRI ABDOMEN WITHOUT AND WITH CONTRAST TECHNIQUE: Multiplanar multisequence MR imaging of the abdomen was performed both before and after the administration of intravenous contrast. CONTRAST:  62mL GADAVIST GADOBUTROL 1 MMOL/ML IV SOLN COMPARISON:  05/16/2020 abdominal ultrasound chest CT 05/31/2020. FINDINGS: Lower chest: Normal heart size without pericardial or pleural effusion. Hepatobiliary: Cholecystectomy, without biliary duct dilatation or choledocholithiasis. Complex fluid collection within the operative bed including at 2.8 x 1.8 cm on 23/6. This is decreased compared to 05/31/2020 chest CT (where it was incompletely imaged on that exam). Multiple foci of mild T2 hyperintensity are identified about the operative bed within the medial left and less so anterior right hepatic lobes. The most well-defined are identified just the left of the fluid collection at 1.3 cm on 49/16 and just to  the right of the anterior portion of the postoperative collection at 1.3 cm on 52/16. Correlate restricted diffusion, especially of the latter lesion on 98/7. Along the capsule of the right hepatic lobe, there are small foci of T2 hypointensity and hypoenhancement. Example at 6 mm on 39/16 and 13 mm on 68/16. Pancreas:  Normal, without mass or ductal dilatation. Spleen:  Normal in size, without focal abnormality. Adrenals/Urinary Tract: Normal adrenal glands. Left larger than right renal cysts. No hydronephrosis. Stomach/Bowel: Tiny hiatal hernia.  Normal abdominal bowel loops. Vascular/Lymphatic: Aortic atherosclerosis. Patent portal hepatic veins. Prominent porta hepatis nodes, including at 1.0 cm on 20/6 and 1.1 cm on 22/6. Other: Trace right abdominal ascites including on 29/6. Smooth peritoneal thickening involving the right-side of the abdomen. Subtle focus of hyperenhancement within the right rectus musculature measures 1.0 cm on 71/16. Right abdominal subcutaneous edema is likely the site of a laparoscopic port. Musculoskeletal: No acute osseous abnormality. IMPRESSION: 1. Status post cholecystectomy with complex fluid collection in the operative bed. Considerations remain biloma, seroma, or abscess. This is incompletely imaged on 05/31/2020 chest CT, but decreased since that exam. 2. Pericholecystic foci of hepatic T2 mild hyperintensity and hypoenhancement. Suspicious for metastatic disease given clinical history of gallbladder carcinoma. This can serve as a baseline for follow-up exams. 3. Foci of T2 hypointensity and hypoenhancement along the right hepatic capsule, suspicious for dropped stones. 4. Trace ascites and mild right abdominal peritoneal thickening are likely postoperative. A subtle focus of right rectus muscular hyperenhancement is nonspecific, warranting attention for muscular metastasis. 5. Prominent but not pathologically sized porta hepatis nodes, indeterminate Electronically Signed   By:  Abigail Miyamoto M.D.   On: 06/10/2020 11:15   US ABDOMEN LIMITED RUQ (LIVER/GB)  Result Date: 05/16/2020 CLINICAL DATA:  Right upper quadrant pain and vomiting. EXAM: ULTRASOUND ABDOMEN LIMITED RIGHT UPPER QUADRANT COMPARISON:  None. FINDINGS: Gallbladder: Multiple mobile echogenic gallstones are seen within the gallbladder lumen (the largest measures approximately 3.0 cm). The gallbladder wall measures 9 mm in thickness and is irregular in appearance. No sonographic Murphy sign noted by sonographer, however, the patient was heavily medicated at the time of the exam. Common bile  duct: Diameter: 3.5 mm Liver: No focal lesion identified. There is mildly increased echogenicity of the liver parenchyma. Portal vein is patent on color Doppler imaging with normal direction of blood flow towards the liver. Other: None. IMPRESSION: 1. Findings likely consistent with acute cholecystitis, despite the absence of a positive sonographic Murphy sign. Further correlation with a nuclear medicine hepatobiliary scan is recommended. Electronically Signed   By: Virgina Norfolk M.D.   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 Stage III- T3N1; liver margin positive.  Awaiting definitive surgery.  CT chest negative for any metastatic disease.  MRI abdomen with and without contrast-residual disease noted gallbladder bed; no evidence of distant metastases or lymphadenopathy. NOv 3rd 2021-CA 19-9 > 2000; awaiting repeat tumor marker today. Check NGS/MMR.   #I had a long discussion the patient that her treatment plan includes definitive surgery and chemotherapy.  Discussed the 2 options: #1 neoadjuvant chemotherapy followed by definitive surgery; #2 definitive surgery followed by adjuvant chemotherapy.  Given the residual cancer noted on imaging; elevated tumor marker-my preference is neoadjuvant chemotherapy.  Patient  is currently awaiting evaluation with Dr. Barry Dienes on 11/22.   #Discussed option of gemcitabine-cis-platinum chemotherapy for neoadjuvant basis.  Discussed that this is fairly intense chemotherapy discussed the potential side effects including but not limited to-increasing fatigue, nausea vomiting, diarrhea, hair loss, sores in the mouth, increase risk of infection and also neuropathy; hearing loss; renal failure.  Given the lack of significant comorbidities I think she should tolerate chemotherapy reasonably well.  Understand that she will need a port; and chemotherapy education prior to starting.  Will await to speak to Dr. Barry Dienes after the results of the tumor marker to decide on the definitive plan.  Also reviewed at the tumor conference.  # DISPOSITION: # Follow up TBD-Dr.B  # I reviewed the blood work- with the patient in detail; also reviewed the imaging independently [as summarized above]; and with the patient in detail.      All questions were answered. The patient knows to call the clinic with any problems, questions or concerns.       Cammie Sickle, MD 06/11/2020 8:01 AM

## 2020-06-14 ENCOUNTER — Other Ambulatory Visit: Payer: Self-pay | Admitting: General Surgery

## 2020-06-18 ENCOUNTER — Encounter: Payer: Self-pay | Admitting: Internal Medicine

## 2020-06-22 ENCOUNTER — Telehealth: Payer: Self-pay | Admitting: Internal Medicine

## 2020-06-22 NOTE — Telephone Encounter (Signed)
On 11/29-: I called on patient to check regarding her recent visit with surgery Dr. Barry Dienes.  As per patient surgery plan on either dec 6 or 13th.  Will again reach out to Dr.Byerly.  GB

## 2020-06-23 ENCOUNTER — Telehealth: Payer: Self-pay | Admitting: Internal Medicine

## 2020-06-23 NOTE — Telephone Encounter (Signed)
On 11/30-discussed with Dr. Barry Dienes; given the continued elevation in tumor marker reasonable to offer neoadjuvant chemotherapy.  Dr. Barry Dienes will also reach out to the patient.  I spoke to patient-regarding my discussion with the surgeon.  Recommend follow-up to discuss neoadjuvant chemotherapy.  Patient agreement.  C-schedule follow-up on 12/03; at 8:45- MD; no labs.  Please confirm with patient.

## 2020-06-24 ENCOUNTER — Other Ambulatory Visit: Payer: Self-pay | Admitting: General Surgery

## 2020-06-24 ENCOUNTER — Encounter: Payer: Self-pay | Admitting: General Surgery

## 2020-06-24 ENCOUNTER — Ambulatory Visit (INDEPENDENT_AMBULATORY_CARE_PROVIDER_SITE_OTHER): Payer: Medicare Other | Admitting: General Surgery

## 2020-06-24 ENCOUNTER — Other Ambulatory Visit: Payer: Self-pay

## 2020-06-24 ENCOUNTER — Telehealth: Payer: Self-pay | Admitting: General Surgery

## 2020-06-24 VITALS — BP 148/90 | HR 80 | Temp 99.2°F | Ht 66.0 in | Wt 212.0 lb

## 2020-06-24 DIAGNOSIS — C23 Malignant neoplasm of gallbladder: Secondary | ICD-10-CM

## 2020-06-24 NOTE — H&P (View-Only) (Signed)
Patient ID: Kristina Orozco, female   DOB: [birth year], 64 y.o.   MRN: [id number]  Chief Complaint  Patient presents with  . Follow-up    HPI Kristina Orozco is a 64 y.o. female.   She underwent laparoscopic cholecystectomy on May 17, 2020.  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  She has been seen by oncology, as well as a hepatobiliary surgeon in [place].  Based upon discussions between the 2, it is been determined that she should undergo neoadjuvant chemotherapy in advance of any further surgery, at least in part because her CA 19-9 remains elevated at over 2000.  She is here today to discuss port placement.  She has healed well from her cholecystectomy.    Past Medical History:  Diagnosis Date  . Cancer ([place])    gallbladder  . Diabetes mellitus without complication ([place])   . Hypertension     Past Surgical History:  Procedure Laterality Date  . ABDOMINAL HYSTERECTOMY    . APPENDECTOMY  2004  . BREAST BIOPSY Left 2011  . BREAST BIOPSY Right 08/26/13  . 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/28/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  . CHOLECYSTECTOMY N/A 05/17/2020   Procedure: LAPAROSCOPIC CHOLECYSTECTOMY;  Surgeon: Fredirick Maudlin, MD;  Location: [facility] ORS;  Service: General;  Laterality: N/A;  . COLONOSCOPY WITH PROPOFOL N/A 01/31/2018   Procedure: COLONOSCOPY WITH PROPOFOL;  Surgeon: Jonathon Bellows, MD;  Location: [facility] ENDOSCOPY;  Service: Gastroenterology;  Laterality: N/A;    Family History  Problem Relation Age of Onset  . Pancreatic cancer Mother        AT 68 years  . Breast cancer Sister        appx-55 years  . Pancreatic cancer Maternal Grandmother     Social History Social History   Tobacco Use  . Smoking status: Never Smoker  . Smokeless tobacco: Never Used  Substance Use Topics  . Alcohol use: Yes  . Drug use: No    No Known Allergies  Current Outpatient Medications  Medication Sig Dispense Refill  . hydrochlorothiazide  (HYDRODIURIL) 25 MG tablet Take 25 mg by mouth daily.    . Multiple Vitamins-Minerals (MULTIVITAMIN WITH MINERALS) tablet Take 1 tablet by mouth daily.    . pravastatin (PRAVACHOL) 10 MG tablet Take 10 mg by mouth at bedtime.     No current facility-administered medications for this visit.    Review of Systems Review of Systems  All other systems reviewed and are negative.   Blood pressure (!) 148/90, pulse 80, temperature 99.2 F (37.3 C), height 5\' 6"  (1.676 m), weight 212 lb (96.2 kg), SpO2 96 %.  Physical Exam Physical Exam Constitutional:      General: She is not in acute distress.    Appearance: She is obese.  HENT:     Head: Normocephalic and atraumatic.     Nose:     Comments: Covered with a mask    Mouth/Throat:     Comments: Covered with a mask Eyes:     General: No scleral icterus.       Right eye: No discharge.        Left eye: No discharge.  Cardiovascular:     Rate and Rhythm: Normal rate and regular rhythm.  Pulmonary:     Effort: Pulmonary effort is normal. No respiratory distress.  Abdominal:     Palpations: Abdomen is soft.     Comments: Well-healed laparoscopic trocar sites.  Genitourinary:    Comments: Deferred Musculoskeletal:  General: No deformity or signs of injury.     Cervical back: No rigidity.  Lymphadenopathy:     Cervical: No cervical adenopathy.  Skin:    General: Skin is warm and dry.     Coloration: Skin is not jaundiced.  Neurological:     General: No focal deficit present.     Mental Status: She is alert and oriented to person, place, and time.  Psychiatric:        Mood and Affect: Mood normal.        Behavior: Behavior normal.     Data Reviewed I reviewed Dr. Aletha Halim clinic and telephone notes in which he discusses the referral to hepatobiliary surgery as well as the plan for neoadjuvant chemotherapy.  Unfortunately, I do not have access to Dr. Marlowe Aschoff clinic notes.  I reviewed her labs as well.  The most notable  finding is the elevation of the CA 19-9 on 2 occasions greater than 2000.  Assessment This is a 64 year old woman who underwent laparoscopic cholecystectomy for what was thought to be acute cholecystitis.  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  She will require additional treatment and discussion between oncology and the hepatobiliary surgeon has determined that she would benefit from neoadjuvant chemotherapy.  I have offered her port placement to facilitate this.  Plan  I discussed the risks of the procedure with her.  These include, but are not limited to, bleeding, infection, port thrombosis or malfunction, need for port revision, port infection requiring removal, pneumothorax and potential need for chest tube placement.  She had the opportunity to ask questions and these were answered.  We will work on getting her scheduled for Port-A-Cath placement.    Fredirick Maudlin 06/24/2020, 12:05 PM

## 2020-06-24 NOTE — Patient Instructions (Addendum)
We have seen you today and have spoken about your port placement. This has been scheduled  at [facility] by [facility].  Please see the [facility]) Sheet provided for further details. Our surgery scheduler will call you to go over surgery dates and information.  Please call our office with any questions or concerns that you have.   Port-a-Cath [facility]) A central line is a soft, flexible tube (catheter) that can be used to collect blood for testing or to give medicine or nutrition through a vein. The tip of the central line ends in a large vein just above the heart called the vena cava. A central line may be placed because:  You need to get medicines or fluids through an IV tube for a long period of time.  You need nutrition but cannot eat or absorb nutrients.  The veins in your hands or arms are hard to access.  You need to have blood taken often for blood tests.  You need a blood transfusion  You need chemotherapy or dialysis.  There are many types of central lines:  Peripherally inserted central catheter (PICC) line. This type is used for intermediate access to long-term access of one week or more. It can be used to draw blood and give fluids or medicines. A PICC looks like an IV tube, but it goes up the arm to the heart. It is usually inserted in the upper arm and taped in place on the arm.  Tunneled central line. This type is used for long-term therapy and dialysis. It is placed in a large vein in the neck, chest, or groin. A tunneled central line is inserted through a small incision made over the vein and is advanced into the heart. It is tunneled beneath the skin and brought out through a second incision.  Non-tunneled central line. This type is used for short-term access, usually of a maximum of 7 days. It is often used in the emergency department. A non-tunneled central line is inserted in the neck, chest, or groin.  Implanted port. This type is used for long-term therapy.  It can stay in place longer than other types of central lines. An implanted port is normally inserted in the upper chest but can also be placed in the upper arm or in the abdomen. It is inserted and removed with surgery, and it is accessed using a special needle.  The type of central line that you receive depends on how long you will need it, your medical condition, and the condition of your veins. What are the risks? Using any type of central line has risks that you should be aware of, including:  Infection.  A blood clot that blocks the central line or forms in the vein and travels to the heart.  Bleeding from the place where the central line was put in.  Developing a hole or crack within the central line. If this happens, the central line will need to be replaced.  Developing an abnormal heart rhythm (arrhythmia). This is rare.  Central line failure.  Follow these instructions at home: Flushing and cleaning the central line  Follow instructions from the health care provider about flushing and cleaning the central line.  Wear a mask when flushing or cleaning the central line.  Before you flush or clean the central line: ? Wash your hands with soap and water. ? Clean the central line hub with rubbing alcohol. Insertion site care  Keep the insertion site of your central line clean  and dry at all times.  Check your incision or central line site every day for signs of infection. Check for: ? More redness, swelling, or pain. ? More fluid or blood. ? Warmth. ? Pus or a bad smell. General instructions  Follow instructions from your health care provider for the type of device that you have.  If the central line accidentally gets pulled on, make sure: ? The bandage (dressing) is okay. ? There is no bleeding. ? The line has not been pulled out.  Return to your normal activities as told by your health care provider. Ask your health care provider what activities are safe for you.  You may be restricted from lifting or making repetitive arm movements on the side with the catheter.  Do not swim or take a bath until the area is fully healed.  Keep your dressing dry.   Keep all follow-up visits as told by your health care provider. This is important. Contact a health care provider if:  You have more redness, swelling, or pain around your incision.  You have more fluid or blood coming from your incision.  Your incision feels warm to the touch.  You have pus or a bad smell coming from your incision. Get help right away if:  You have: ? Chills. ? A fever. ? Shortness of breath. ? Trouble breathing. ? Chest pain. ? Swelling in your neck, face, chest, or arm on the side of your central line.  You are coughing.  You feel your heart beating rapidly or skipping beats.  You feel dizzy or you faint.  Your incision or central line site has red streaks spreading away from the area.  Your incision or central line site is bleeding and does not stop.  Your central line is difficult to flush or will not flush.  You do not get a blood return from the central line.  Your central line gets loose or comes out.  Your central line gets damaged.  Your catheter leaks when flushed or when fluids are infused into it. This information is not intended to replace advice given to you by your health care provider. Make sure you discuss any questions you have with your health care provider. Document Released: 08/31/2005 Document Revised: 03/08/2016 Document Reviewed: 02/16/2016 Elsevier Interactive Patient Education  2017 [employer].

## 2020-06-24 NOTE — Telephone Encounter (Signed)
Outgoing call is made, left message for patient to call.  Please advise patient of Pre-Admission date/time, COVID Testing date and Surgery date.  Surgery Date: 07/02/20 Preadmission Testing Date: 06/30/20 (phone 1p-5p) Covid Testing Date: 07/01/20 - patient advised to go to the [facility] ([place]) between 8a-1p   Also patient needs to call [phone], between 1-3:00pm the day before surgery, to find out what time to arrive for surgery.

## 2020-06-24 NOTE — Progress Notes (Signed)
Patient ID: Kristina Orozco, female   DOB: 01-02-1956, 64 y.o.   MRN: [id number]  Chief Complaint  Patient presents with  . Follow-up    HPI Kristina Orozco is a 64 y.o. female.   She underwent laparoscopic cholecystectomy on May 17, 2020.  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  She has been seen by oncology, as well as a hepatobiliary surgeon in [place].  Based upon discussions between the 2, it is been determined that she should undergo neoadjuvant chemotherapy in advance of any further surgery, at least in part because her CA 19-9 remains elevated at over 2000.  She is here today to discuss port placement.  She has healed well from her cholecystectomy.    Past Medical History:  Diagnosis Date  . Cancer ([place])    gallbladder  . Diabetes mellitus without complication ([place])   . Hypertension     Past Surgical History:  Procedure Laterality Date  . ABDOMINAL HYSTERECTOMY    . APPENDECTOMY  2004  . BREAST BIOPSY Left 2011  . BREAST BIOPSY Right 08/26/13  . 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/28/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  . CHOLECYSTECTOMY N/A 05/17/2020   Procedure: LAPAROSCOPIC CHOLECYSTECTOMY;  Surgeon: Fredirick Maudlin, MD;  Location: [facility] ORS;  Service: General;  Laterality: N/A;  . COLONOSCOPY WITH PROPOFOL N/A 01/31/2018   Procedure: COLONOSCOPY WITH PROPOFOL;  Surgeon: Jonathon Bellows, MD;  Location: [facility] ENDOSCOPY;  Service: Gastroenterology;  Laterality: N/A;    Family History  Problem Relation Age of Onset  . Pancreatic cancer Mother        AT 61 years  . Breast cancer Sister        appx-55 years  . Pancreatic cancer Maternal Grandmother     Social History Social History   Tobacco Use  . Smoking status: Never Smoker  . Smokeless tobacco: Never Used  Substance Use Topics  . Alcohol use: Yes  . Drug use: No    No Known Allergies  Current Outpatient Medications  Medication Sig Dispense Refill  . hydrochlorothiazide  (HYDRODIURIL) 25 MG tablet Take 25 mg by mouth daily.    . Multiple Vitamins-Minerals (MULTIVITAMIN WITH MINERALS) tablet Take 1 tablet by mouth daily.    . pravastatin (PRAVACHOL) 10 MG tablet Take 10 mg by mouth at bedtime.     No current facility-administered medications for this visit.    Review of Systems Review of Systems  All other systems reviewed and are negative.   Blood pressure (!) 148/90, pulse 80, temperature 99.2 F (37.3 C), height 5\' 6"  (1.676 m), weight 212 lb (96.2 kg), SpO2 96 %.  Physical Exam Physical Exam Constitutional:      General: She is not in acute distress.    Appearance: She is obese.  HENT:     Head: Normocephalic and atraumatic.     Nose:     Comments: Covered with a mask    Mouth/Throat:     Comments: Covered with a mask Eyes:     General: No scleral icterus.       Right eye: No discharge.        Left eye: No discharge.  Cardiovascular:     Rate and Rhythm: Normal rate and regular rhythm.  Pulmonary:     Effort: Pulmonary effort is normal. No respiratory distress.  Abdominal:     Palpations: Abdomen is soft.     Comments: Well-healed laparoscopic trocar sites.  Genitourinary:    Comments: Deferred Musculoskeletal:  General: No deformity or signs of injury.     Cervical back: No rigidity.  Lymphadenopathy:     Cervical: No cervical adenopathy.  Skin:    General: Skin is warm and dry.     Coloration: Skin is not jaundiced.  Neurological:     General: No focal deficit present.     Mental Status: She is alert and oriented to person, place, and time.  Psychiatric:        Mood and Affect: Mood normal.        Behavior: Behavior normal.     Data Reviewed I reviewed Dr. Aletha Halim clinic and telephone notes in which he discusses the referral to hepatobiliary surgery as well as the plan for neoadjuvant chemotherapy.  Unfortunately, I do not have access to Dr. Marlowe Aschoff clinic notes.  I reviewed her labs as well.  The most notable  finding is the elevation of the CA 19-9 on 2 occasions greater than 2000.  Assessment This is a 64 year old woman who underwent laparoscopic cholecystectomy for what was thought to be acute cholecystitis.  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  She will require additional treatment and discussion between oncology and the hepatobiliary surgeon has determined that she would benefit from neoadjuvant chemotherapy.  I have offered her port placement to facilitate this.  Plan  I discussed the risks of the procedure with her.  These include, but are not limited to, bleeding, infection, port thrombosis or malfunction, need for port revision, port infection requiring removal, pneumothorax and potential need for chest tube placement.  She had the opportunity to ask questions and these were answered.  We will work on getting her scheduled for Port-A-Cath placement.    Fredirick Maudlin 06/24/2020, 12:05 PM

## 2020-06-25 ENCOUNTER — Inpatient Hospital Stay: Payer: Medicare Other | Attending: Internal Medicine | Admitting: Internal Medicine

## 2020-06-25 ENCOUNTER — Encounter: Payer: Self-pay | Admitting: Internal Medicine

## 2020-06-25 DIAGNOSIS — C23 Malignant neoplasm of gallbladder: Secondary | ICD-10-CM

## 2020-06-25 DIAGNOSIS — Z5111 Encounter for antineoplastic chemotherapy: Secondary | ICD-10-CM | POA: Diagnosis present

## 2020-06-25 DIAGNOSIS — C7989 Secondary malignant neoplasm of other specified sites: Secondary | ICD-10-CM | POA: Diagnosis not present

## 2020-06-25 DIAGNOSIS — Z5189 Encounter for other specified aftercare: Secondary | ICD-10-CM | POA: Diagnosis not present

## 2020-06-25 MED ORDER — PROCHLORPERAZINE MALEATE 10 MG PO TABS: 10.0000 mg | ORAL_TABLET | Freq: Four times a day (QID) | ORAL | 1 refills | Status: DC | PRN

## 2020-06-25 MED ORDER — LIDOCAINE-PRILOCAINE 2.5-2.5 % EX CREA: 1.0000 "application " | TOPICAL_CREAM | CUTANEOUS | 0 refills | Status: DC | PRN

## 2020-06-25 MED ORDER — ONDANSETRON HCL 8 MG PO TABS: ORAL_TABLET | ORAL | 1 refills | Status: DC

## 2020-06-25 NOTE — Progress Notes (Signed)
[facility] CONSULT NOTE  Patient Care Team: Revelo, Elyse Jarvis, MD as PCP - General (Family Medicine) Rico Junker, RN as Registered Nurse Christene Lye, MD (General Surgery) Clent Jacks, RN as Oncology Nurse Navigator  CHIEF COMPLAINTS/PURPOSE OF CONSULTATION: GALLBLADDER CANCER   #  Oncology History Overview Note  10/25- GALLBLADDER CANCER- [Dr.Cannon]- laproscopic cholecystectomy- pT Category: pT3; pN Category: pN1  pM Category: Not applicable   SURGICAL PATHOLOGY  CASE: [id number]  PATIENT: Kristina Orozco  Surgical Pathology Report      Specimen Submitted:  A. Gallbladder   Clinical History: Cholecystitis.       DIAGNOSIS:  A. GALLBLADDER; CHOLECYSTECTOMY:  - ADENOCARCINOMA OF THE GALLBLADDER.  - CARCINOMA IN SITU.  - SEE CANCER SUMMARY BELOW.  - PERINEURAL INVASION IS PRESENT.  - ACUTE ON CHRONIC CHOLECYSTITIS WITH CHOLELITHIASIS.  - METASTATIC ADENOCARCINOMA INVOLVES CYSTIC DUCT LYMPH NODE (1/1).   CANCER CASE SUMMARY: GALLBLADDER  Standard(s): AJCC-UICC 8   SPECIMEN  Procedure: Simple cholecystectomy:   TUMOR  Tumor Site: Fundus  Histologic Type: Adenocarcinoma, biliary type  Histologic Grade: G3, poorly differentiated  Tumor Size: Greatest dimension: 2.2 cm  Tumor Extent: Tumor directly invades the liver  Lymphovascular Invasion: Present   MARGINS  Margin Status for Invasive Carcinoma:      Margin Involved by Invasive Carcinoma: Liver parenchymal / hepatic  bed  Margin Status for Intraepithelial Neoplasia:      All margins negative for high-grade intraepithelial neoplasia   REGIONAL LYMPH NODES  Regional Lymph Node Status: Tumor present in regional lymph node       Number of Lymph Nodes with Tumor: 1       Number of Lymph Nodes Examined: 1   DISTANT METASTASIS  Distant Site(s) Involved, if applicable: Not applicable   PATHOLOGIC STAGE CLASSIFICATION (pTNM, AJCC 8th Edition):  TNM Descriptors:  Not applicable  pT Category: pT3  Regional Lymph Nodes Modifier: Not applicable  pN Category: pN1  pM Category: Not applicable   # October 29NL 2021- Incidental gallbladder ADENOCA-s/p acute cholecystectomy [Dr.Cannon]. Stage III- T3N1; liver margin positive.  CT chest negative for any metastatic disease.  MRI abdomen with and without contrast-residual disease noted gallbladder bed; no evidence of distant metastases or lymphadenopathy. NOv 3rd 2021-CA 19-9 > 2000;  Check NGS/MMR.   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[place])   06/25/2020 -  Chemotherapy   The patient had dexamethasone (DECADRON) 4 MG tablet, 8 mg, Oral, Daily, 0 of 1 cycle, Start date: --, End date: -- PALONOSETRON HCL INJECTION 0.25 MG/5ML, 0.25 mg, Intravenous,  Once, 0 of 4 cycles pegfilgrastim-jmdb (FULPHILA) injection 6 mg, 6 mg, Subcutaneous,  Once, 0 of 4 cycles CISplatin (PLATINOL) 53 mg in sodium chloride 0.9 % 250 mL chemo infusion, 25 mg/m2, Intravenous,  Once, 0 of 4 cycles gemcitabine (GEMZAR) 2,128 mg in sodium chloride 0.9 % 250 mL chemo infusion, 1,000 mg/m2, Intravenous,  Once, 0 of 4 cycles FOSAPREPITANT IV INFUSION 150 MG, 150 mg, Intravenous,  Once, 0 of 4 cycles  for chemotherapy treatment.       HISTORY OF PRESENTING ILLNESS:  Kristina Orozco 64 y.o.  female with new diagnosis of gallbladder cancer is here for follow-up.  In the interim patient  has met with surgery.   Otherwise denies any nausea vomiting.  No fevers or chills.  Appetite is picking up.   Review of Systems  Constitutional: Negative for chills, diaphoresis, fever and malaise/fatigue.  HENT: Negative for nosebleeds and sore throat.   Eyes: Negative for double vision.  Respiratory: Negative for cough, hemoptysis, sputum production,  shortness of breath and wheezing.   Cardiovascular: Negative for chest pain, palpitations, orthopnea and leg swelling.  Gastrointestinal: Negative for abdominal pain, blood in stool, constipation, diarrhea, heartburn, melena, nausea and vomiting.  Genitourinary: Negative for dysuria, frequency and urgency.  Musculoskeletal: Negative for back pain and joint pain.  Skin: Negative.  Negative for itching and rash.  Neurological: Negative for dizziness, tingling, focal weakness, weakness and headaches.  Endo/Heme/Allergies: Does not bruise/bleed easily.  Psychiatric/Behavioral: Negative for depression. The patient is not nervous/anxious and does not have insomnia.      MEDICAL HISTORY:  Past Medical History:  Diagnosis Date  . Cancer ([place])    gallbladder  . Diabetes mellitus without complication ([place])   . Hypertension     SURGICAL HISTORY: Past Surgical History:  Procedure Laterality Date  . ABDOMINAL HYSTERECTOMY    . APPENDECTOMY  2004  . BREAST BIOPSY Left 2011  . BREAST BIOPSY Right 08/26/13  . 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/28/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  . CHOLECYSTECTOMY N/A 05/17/2020   Procedure: LAPAROSCOPIC CHOLECYSTECTOMY;  Surgeon: Fredirick Maudlin, MD;  Location: [facility] ORS;  Service: General;  Laterality: N/A;  . COLONOSCOPY WITH PROPOFOL N/A 01/31/2018   Procedure: COLONOSCOPY WITH PROPOFOL;  Surgeon: Jonathon Bellows, MD;  Location: [facility] ENDOSCOPY;  Service: Gastroenterology;  Laterality: N/A;    SOCIAL HISTORY: Social History   Socioeconomic History  . Marital status: Single    Spouse name: Not on file  . Number of children: Not on file  . Years of education: Not on file  . Highest education level: Not on file  Occupational History  . Not on file  Tobacco Use  . Smoking status: Never Smoker  . Smokeless tobacco: Never Used  Substance and Sexual Activity  . Alcohol use: Yes  . Drug use: No  . Sexual activity: Not on file  Other Topics Concern  . Not on file  Social History  Narrative   Lives in [place]; with son. 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 Never smoked; beer/iqour over weekends.    Social Determinants of Health   Financial Resource Strain:   . Difficulty of Paying Living Expenses: Not on file  Food Insecurity:   . Worried About Charity fundraiser in the Last Year: Not on file  . Ran Out of Food in the Last Year: Not on file  Transportation Needs:   . Lack of Transportation (Medical): Not on file  . Lack of Transportation (Non-Medical): Not on file  Physical Activity:   . Days of Exercise per Week: Not on file  . Minutes of Exercise per Session: Not on file  Stress:   . Feeling of Stress : Not on file  Social Connections:   . Frequency of Communication with Friends and Family: Not on file  . Frequency of Social Gatherings with Friends and Family: Not on file  . Attends Religious Services: Not on file  . Active Member of Clubs or Organizations: Not on file  . Attends Archivist Meetings: Not on file  . Marital Status: Not on file  Intimate Partner Violence:   . Fear of Current or Ex-Partner: Not on file  .  Emotionally Abused: Not on file  . Physically Abused: Not on file  . Sexually Abused: Not on file    FAMILY HISTORY: Family History  Problem Relation Age of Onset  . Pancreatic cancer Mother        AT 46 years  . Breast cancer Sister        appx-55 years  . Pancreatic cancer Maternal Grandmother     ALLERGIES:  has No Known Allergies.  MEDICATIONS:  Current Outpatient Medications  Medication Sig Dispense Refill  . hydrochlorothiazide (HYDRODIURIL) 25 MG tablet Take 25 mg by mouth daily.    Marland Kitchen losartan (COZAAR) 25 MG tablet Take 25 mg by mouth daily.    . Multiple Vitamins-Minerals (MULTIVITAMIN WITH MINERALS) tablet Take 1 tablet by mouth daily.    . pravastatin (PRAVACHOL) 10 MG tablet Take 10 mg by mouth at bedtime.    . lidocaine-prilocaine (EMLA) cream Apply 1 application topically as  needed. Apply on the port 30-45 mins prior to port access 30 g 0  . ondansetron (ZOFRAN) 8 MG tablet One pill every 8 hours as needed for nausea/vomitting. 40 tablet 1  . prochlorperazine (COMPAZINE) 10 MG tablet Take 1 tablet (10 mg total) by mouth every 6 (six) hours as needed for nausea or vomiting. 40 tablet 1   No current facility-administered medications for this visit.      Marland Kitchen  PHYSICAL EXAMINATION: ECOG PERFORMANCE STATUS: 0 - Asymptomatic  Vitals:   06/25/20 0902  BP: 122/73  Pulse: 88  Resp: 18  Temp: 98.2 F (36.8 C)  SpO2: 99%   Filed Weights   06/25/20 0902  Weight: 213 lb (96.6 kg)    Physical Exam HENT:     Head: Normocephalic and atraumatic.     Mouth/Throat:     Pharynx: No oropharyngeal exudate.  Eyes:     Pupils: Pupils are equal, round, and reactive to light.  Cardiovascular:     Rate and Rhythm: Normal rate and regular rhythm.  Pulmonary:     Effort: Pulmonary effort is normal. No respiratory distress.     Breath sounds: Normal breath sounds. No wheezing.  Abdominal:     General: Bowel sounds are normal. There is no distension.     Palpations: Abdomen is soft. There is no mass.     Tenderness: There is no abdominal tenderness. There is no guarding or rebound.  Musculoskeletal:        General: No tenderness. Normal range of motion.     Cervical back: Normal range of motion and neck supple.  Skin:    General: Skin is warm.  Neurological:     Mental Status: She is alert and oriented to person, place, and time.  Psychiatric:        Mood and Affect: Affect normal.      LABORATORY DATA:  I have reviewed the data as listed Lab Results  Component Value Date   WBC 7.3 06/10/2020   HGB 11.9 (L) 06/10/2020   HCT 37.0 06/10/2020   MCV 84.1 06/10/2020   PLT 332 06/10/2020   Recent Labs    05/17/20 0340 05/26/20 1231 06/10/20 1018  NA 138 140 136  K 3.2* 3.3* 3.2*  CL 97* 96* 98  CO2 30 33* 27  GLUCOSE 141* 135* 213*  BUN _0 CREATININE 0.68 0.48 0.64  CALCIUM 9.2 9.5 8.9  GFRNONAA >60 >60 >60  PROT 7.5 8.2* 7.5  ALBUMIN 4.1 3.5 3.8  AST 24 31 35  ALT _0 ALKPHOS 48 106 62  BILITOT 1.1 0.9 0.5    RADIOGRAPHIC STUDIES: I have personally reviewed the radiological images as listed and agreed with the findings in the report. CT Chest W Contrast  Result Date: 05/31/2020 CLINICAL DATA:  Chest staging, recent diagnosis of gallbladder adenocarcinoma status post cholecystectomy. EXAM: CT CHEST WITH CONTRAST TECHNIQUE: Multidetector CT imaging of the chest was performed during intravenous contrast administration. CONTRAST:  83m OMNIPAQUE IOHEXOL 300 MG/ML  SOLN COMPARISON:  None. FINDINGS: Cardiovascular: Aortic atherosclerosis. Normal heart size. Left coronary artery calcifications. No pericardial effusion. Mediastinum/Nodes: No enlarged mediastinal, hilar, or axillary lymph nodes. Small hiatal hernia. Thyroid gland, trachea, and esophagus demonstrate no significant findings. Lungs/Pleura: Bandlike scarring of the dependent right lower lobe (series 3, image 60). No pleural effusion or pneumothorax. Upper Abdomen: Surgical clips and a partially imaged fluid attenuation collection in the gallbladder fossa (series 2, image 132). Musculoskeletal: No chest wall mass or suspicious bone lesions identified. IMPRESSION: 1. No evidence of metastatic disease in the chest. 2. Surgical clips and a partially imaged fluid attenuation collection in the gallbladder fossa, in keeping with recent postoperative findings of cholecystectomy. This collection is nonspecific and may reflect hematoma, seroma, or biloma. Correlate with forthcoming planned staging imaging of the abdomen and pelvis. 3. Coronary artery disease.  Aortic Atherosclerosis (ICD10-I70.0). Electronically Signed   By: AEddie CandleM.D.   On: 05/31/2020 15:07   MR Abdomen W Wo Contrast  Result Date: 06/10/2020 CLINICAL DATA:  Recent cholecystectomy demonstrating  gallbladder carcinoma. EXAM: MRI ABDOMEN WITHOUT AND WITH CONTRAST TECHNIQUE: Multiplanar multisequence MR imaging of the abdomen was performed both before and after the administration of intravenous contrast. CONTRAST:  93mGADAVIST GADOBUTROL 1 MMOL/ML IV SOLN COMPARISON:  05/16/2020 abdominal ultrasound chest CT 05/31/2020. FINDINGS: Lower chest: Normal heart size without pericardial or pleural effusion. Hepatobiliary: Cholecystectomy, without biliary duct dilatation or choledocholithiasis. Complex fluid collection within the operative bed including at 2.8 x 1.8 cm on 23/6. This is decreased compared to 05/31/2020 chest CT (where it was incompletely imaged on that exam). Multiple foci of mild T2 hyperintensity are identified about the operative bed within the medial left and less so anterior right hepatic lobes. The most well-defined are identified just the left of the fluid collection at 1.3 cm on 49/16 and just to the right of the anterior portion of the postoperative collection at 1.3 cm on 52/16. Correlate restricted diffusion, especially of the latter lesion on 98/7. Along the capsule of the right hepatic lobe, there are small foci of T2 hypointensity and hypoenhancement. Example at 6 mm on 39/16 and 13 mm on 68/16. Pancreas:  Normal, without mass or ductal dilatation. Spleen:  Normal in size, without focal abnormality. Adrenals/Urinary Tract: Normal adrenal glands. Left larger than right renal cysts. No hydronephrosis. Stomach/Bowel: Tiny hiatal hernia.  Normal abdominal bowel loops. Vascular/Lymphatic: Aortic atherosclerosis. Patent portal hepatic veins. Prominent porta hepatis nodes, including at 1.0 cm on 20/6 and 1.1 cm on 22/6. Other: Trace right abdominal ascites including on 29/6. Smooth peritoneal thickening involving the right-side of the abdomen. Subtle focus of hyperenhancement within the right rectus musculature measures 1.0 cm on 71/16. Right abdominal subcutaneous edema is likely the site of a  laparoscopic port. Musculoskeletal: No acute osseous abnormality. IMPRESSION: 1. Status post cholecystectomy with complex fluid collection in the operative bed. Considerations remain biloma, seroma, or abscess. This is incompletely imaged on 05/31/2020 chest CT, but decreased since that exam. 2. Pericholecystic foci of hepatic  T2 mild hyperintensity and hypoenhancement. Suspicious for metastatic disease given clinical history of gallbladder carcinoma. This can serve as a baseline for follow-up exams. 3. Foci of T2 hypointensity and hypoenhancement along the right hepatic capsule, suspicious for dropped stones. 4. Trace ascites and mild right abdominal peritoneal thickening are likely postoperative. A subtle focus of right rectus muscular hyperenhancement is nonspecific, warranting attention for muscular metastasis. 5. Prominent but not pathologically sized porta hepatis nodes, indeterminate Electronically Signed   By: Abigail Miyamoto M.D.   On: 06/10/2020 11:15    ASSESSMENT & PLAN:   Primary adenocarcinoma of gall bladder ([facility]) #Incidental gallbladder adenocarcinoma-s/p acute cholecystectomy. Stage III- T3N1; liver margin positive. NOV 2021- MRI abdomen with and without contrast-residual disease noted gallbladder bed; no evidence of distant metastases or lymphadenopathy. NOV 2021- ca-19-9> 2000.  #S/p evaluation with Dr. Barry Dienes for definitive surgery.  However given the concerns noted on the MRI; and more so with elevated tumor marker greater than 2000-I think it is reasonable to proceed with neoadjuvant chemotherapy followed by definitive surgery.  Would recommend approximately 4 months of neoadjuvant therapy [up to 6 cycles of chemotherapy]. Discussed with Dr.Byerly.   #I reviewed the chemotherapy again/schedule- of gemcitabine-cis-platinum [day 1 day 8 every 21 days] chemotherapy for neoadjuvant basis.  Discussed that this is fairly intense chemotherapy discussed the potential side effects including but  not limited to-increasing fatigue, nausea vomiting, diarrhea, hair loss, sores in the mouth, increase risk of infection and also neuropathy; hearing loss; renal failure.  Given the lack of significant comorbidities I think she should tolerate chemotherapy reasonably well.  Patient had a port with Dr. Celine Ahr; next week discussed with Dr. Celine Ahr.  #Prescription for antiemetics; EMLA cream; chemotherapy education.  #Prognosis: Patient understands that this is a serious illness with need for intense chemotherapy.  Patient understands the need for neoadjuvant chemotherapy is to eradicate micrometastatic disease.  She understands that in a relatively infrequent situation-her cancer may progress through chemotherapy which will be very unfortunate/incurable.  However, this could in fact portend overall poor prognosis/and lack of benefit from aggressive surgery.  # DISPOSITION: # chemo education  #  Follow up on dec 15th- MD; labs- cbc/cmp;mag; ca-19-9; Gem-Cis # follow up on dec 22nd- MD; labs- cbc/cmp/mag- Gem-Cis- Dr.B     All questions were answered. The patient knows to call the clinic with any problems, questions or concerns.       Cammie Sickle, MD 06/25/2020 12:16 PM

## 2020-06-25 NOTE — Progress Notes (Signed)
START OFF PATHWAY REGIMEN - Other   [id number]:Cisplatin 25 mg/m2 D1,8 + Gemcitabine 1,000 mg/m2 D1,8 q21 Days:   A cycle is every 21 days:     Gemcitabine      Cisplatin   **Always confirm dose/schedule in your pharmacy ordering system**  Patient Characteristics: Intent of Therapy: Curative Intent, Discussed with Patient

## 2020-06-25 NOTE — Assessment & Plan Note (Addendum)
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 NOV 2021- MRI abdomen with and without contrast-residual disease noted gallbladder bed; no evidence of distant metastases or lymphadenopathy. NOV 2021- ca-19-9> 2000.  #S/p evaluation with Dr. Barry Dienes for definitive surgery.  However given the concerns noted on the MRI; and more so with elevated tumor marker greater than 2000-I think it is reasonable to proceed with neoadjuvant chemotherapy followed by definitive surgery.  Would recommend approximately 4 months of neoadjuvant therapy [up to 6 cycles of chemotherapy]. Discussed with Dr.Byerly.   #I reviewed the chemotherapy again/schedule- of gemcitabine-cis-platinum [day 1 day 8 every 21 days] chemotherapy for neoadjuvant basis.  Discussed that this is fairly intense chemotherapy discussed the potential side effects including but not limited to-increasing fatigue, nausea vomiting, diarrhea, hair loss, sores in the mouth, increase risk of infection and also neuropathy; hearing loss; renal failure.  Given the lack of significant comorbidities I think she should tolerate chemotherapy reasonably well.  Patient had a port with Dr. Celine Ahr; next week discussed with Dr. Celine Ahr.  #Prescription for antiemetics; EMLA cream; chemotherapy education.  #Prognosis: Patient understands that this is a serious illness with need for intense chemotherapy.  Patient understands the need for neoadjuvant chemotherapy is to eradicate micrometastatic disease.  She understands that in a relatively infrequent situation-her cancer may progress through chemotherapy which will be very unfortunate/incurable.  However, this could in fact portend overall poor prognosis/and lack of benefit from aggressive surgery.  # DISPOSITION: # chemo education  #  Follow up on dec 15th- MD; labs- cbc/cmp;mag; ca-19-9; Gem-Cis # follow up on dec 22nd- MD; labs- cbc/cmp/mag- Gem-Cis- Dr.B

## 2020-06-25 NOTE — Progress Notes (Signed)
Pt in for follow up and to see MD for treatment plan  today.

## 2020-06-29 ENCOUNTER — Other Ambulatory Visit: Payer: Self-pay | Admitting: Internal Medicine

## 2020-06-29 NOTE — Telephone Encounter (Signed)
Outgoing call is made again.  Patient is now aware of all dates regarding her surgery and voices understanding.

## 2020-06-30 ENCOUNTER — Encounter
Admission: RE | Admit: 2020-06-30 | Discharge: 2020-06-30 | Disposition: A | Discharge status: Home / Self Care | Payer: Medicare Other | Source: Ambulatory Visit | Attending: General Surgery | Admitting: General Surgery

## 2020-06-30 ENCOUNTER — Other Ambulatory Visit: Payer: Self-pay

## 2020-06-30 ENCOUNTER — Other Ambulatory Visit: Payer: Medicare Other

## 2020-06-30 NOTE — Patient Instructions (Addendum)
Your procedure is scheduled on:07-02-20 FRIDAY Report to the Registration Desk on the 1st floor of the [facility]-Then proceed to 2nd floor Surgery Desk in the [place] To find out your arrival time, please call [phone] between 1PM - 3PM on:07-01-20 THURSDAY  REMEMBER: Instructions that are not followed completely may result in serious medical risk, up to and including death; or upon the discretion of your surgeon and anesthesiologist your surgery may need to be rescheduled.  Do not eat food after midnight the night before surgery.  No gum chewing, lozengers or hard candies.  You may however, drink WATER up to 2 hours before you are scheduled to arrive for your surgery. Do not drink anything within 2 hours of your scheduled arrival time.  Type 1 and Type 2 diabetics should only drink water.  TAKE THESE MEDICATIONS THE MORNING OF SURGERY WITH A SIP OF WATER: -NONE  One week prior to surgery: Stop Anti-inflammatories (NSAIDS) such as Advil, Aleve, Ibuprofen, Motrin, Naproxen, Naprosyn and Aspirin based products such as Excedrin, Goodys Powder, BC Powder-OK TO TAKE TYLENOL IF NEEDED  Stop ANY OVER THE COUNTER supplements until after surgery-STOP GARLIC AND CINNAMON NOW-YOU MAY RESUME AFTER SURGERY (However, you may continue taking multivitamin up until the day before surgery.)  No Alcohol for 24 hours before or after surgery.  No Smoking including e-cigarettes for 24 hours prior to surgery.  No chewable tobacco products for at least 6 hours prior to surgery.  No nicotine patches on the day of surgery.  Do not use any "recreational" drugs for at least a week prior to your surgery.  Please be advised that the combination of cocaine and anesthesia may have negative outcomes, up to and including death. If you test positive for cocaine, your surgery will be cancelled.  On the morning of surgery brush your teeth with toothpaste and water, you may rinse your mouth with mouthwash if  you wish. Do not swallow any toothpaste or mouthwash.  Do not wear jewelry, make-up, hairpins, clips or nail polish.  Do not wear lotions, powders, or perfumes.   Do not shave body from the neck down 48 hours prior to surgery just in case you cut yourself which could leave a site for infection.  Also, freshly shaved skin may become irritated if using the CHG soap.  Contact lenses, hearing aids and dentures may not be worn into surgery.  Do not bring valuables to the hospital. [facility] is not responsible for any missing/lost belongings or valuables.   Use CHG Soap as directed on instruction sheet.  Notify your doctor if there is any change in your medical condition (cold, fever, infection).  Wear comfortable clothing (specific to your surgery type) to the hospital.  Plan for stool softeners for home use; pain medications have a tendency to cause constipation. You can also help prevent constipation by eating foods high in fiber such as fruits and vegetables and drinking plenty of fluids as your diet allows.  After surgery, you can help prevent lung complications by doing breathing exercises.  Take deep breaths and cough every 1-2 hours. Your doctor may order a device called an Incentive Spirometer to help you take deep breaths. When coughing or sneezing, hold a pillow firmly against your incision with both hands. This is called "splinting." Doing this helps protect your incision. It also decreases belly discomfort.  If you are being admitted to the hospital overnight, leave your suitcase in the car. After surgery it may be brought  to your room.  If you are being discharged the day of surgery, you will not be allowed to drive home. You will need a responsible adult (18 years or older) to drive you home and stay with you that night.   If you are taking public transportation, you will need to have a responsible adult (18 years or older) with you. Please confirm with your physician  that it is acceptable to use public transportation.   Please call the [facility] at [phone] if you have any questions about these instructions.  Visitation Policy:  Patients undergoing a surgery or procedure may have one family member or support person with them as long as that person is not COVID-19 positive or experiencing its symptoms.  That person may remain in the waiting area during the procedure.  Inpatient Visitation Update:   In an effort to ensure the safety of our team members and our patients, we are implementing a change to our visitation policy:  Effective Monday, Aug. 9, at 7 a.m., inpatients will be allowed one support person.  o The support person may change daily.  o The support person must pass our screening, gel in and out, and wear a mask at all times, including in the patient's room.  o Patients must also wear a mask when staff or their support person are in the room.  o Masking is required regardless of vaccination status.  Systemwide, no visitors 17 or younger.

## 2020-07-01 ENCOUNTER — Other Ambulatory Visit
Admission: RE | Admit: 2020-07-01 | Discharge: 2020-07-01 | Disposition: A | Discharge status: Home / Self Care | Payer: Medicare Other | Source: Ambulatory Visit | Attending: General Surgery | Admitting: General Surgery

## 2020-07-01 DIAGNOSIS — Z20822 Contact with and (suspected) exposure to covid-19: Secondary | ICD-10-CM | POA: Insufficient documentation

## 2020-07-01 DIAGNOSIS — Z01812 Encounter for preprocedural laboratory examination: Secondary | ICD-10-CM | POA: Insufficient documentation

## 2020-07-01 LAB — POTASSIUM: Potassium: 3.4 mmol/L — ABNORMAL LOW (ref 3.5–5.1)

## 2020-07-02 ENCOUNTER — Ambulatory Visit: Payer: Medicare Other | Admitting: Anesthesiology

## 2020-07-02 ENCOUNTER — Encounter
Admission: RE | Disposition: A | Discharge status: Home / Self Care | Payer: Self-pay | Source: Home / Self Care | Attending: General Surgery

## 2020-07-02 ENCOUNTER — Ambulatory Visit: Payer: Medicare Other

## 2020-07-02 ENCOUNTER — Ambulatory Visit
Admission: RE | Admit: 2020-07-02 | Discharge: 2020-07-02 | Disposition: A | Discharge status: Home / Self Care | Payer: Medicare Other | Attending: General Surgery | Admitting: General Surgery

## 2020-07-02 ENCOUNTER — Encounter: Payer: Self-pay | Admitting: General Surgery

## 2020-07-02 ENCOUNTER — Other Ambulatory Visit: Payer: Self-pay

## 2020-07-02 DIAGNOSIS — Z79899 Other long term (current) drug therapy: Secondary | ICD-10-CM | POA: Diagnosis not present

## 2020-07-02 DIAGNOSIS — J939 Pneumothorax, unspecified: Secondary | ICD-10-CM

## 2020-07-02 DIAGNOSIS — C23 Malignant neoplasm of gallbladder: Secondary | ICD-10-CM | POA: Insufficient documentation

## 2020-07-02 HISTORY — PX: PORTACATH PLACEMENT: SHX2246

## 2020-07-02 LAB — GLUCOSE, CAPILLARY
Glucose-Capillary: 153 mg/dL — ABNORMAL HIGH (ref 70–99)
Glucose-Capillary: 122 mg/dL — ABNORMAL HIGH (ref 70–99)

## 2020-07-02 LAB — SARS CORONAVIRUS 2 (TAT 6-24 HRS): SARS Coronavirus 2: NEGATIVE

## 2020-07-02 IMAGING — DX DG CHEST 1V PORT
1 series · 1 of 1 positions shown · non-contrast
Comparison: Chest CT [DATE]

CLINICAL DATA: Port-A-Cath placement.

EXAM:
PORTABLE CHEST 1 VIEW

[chest ap]
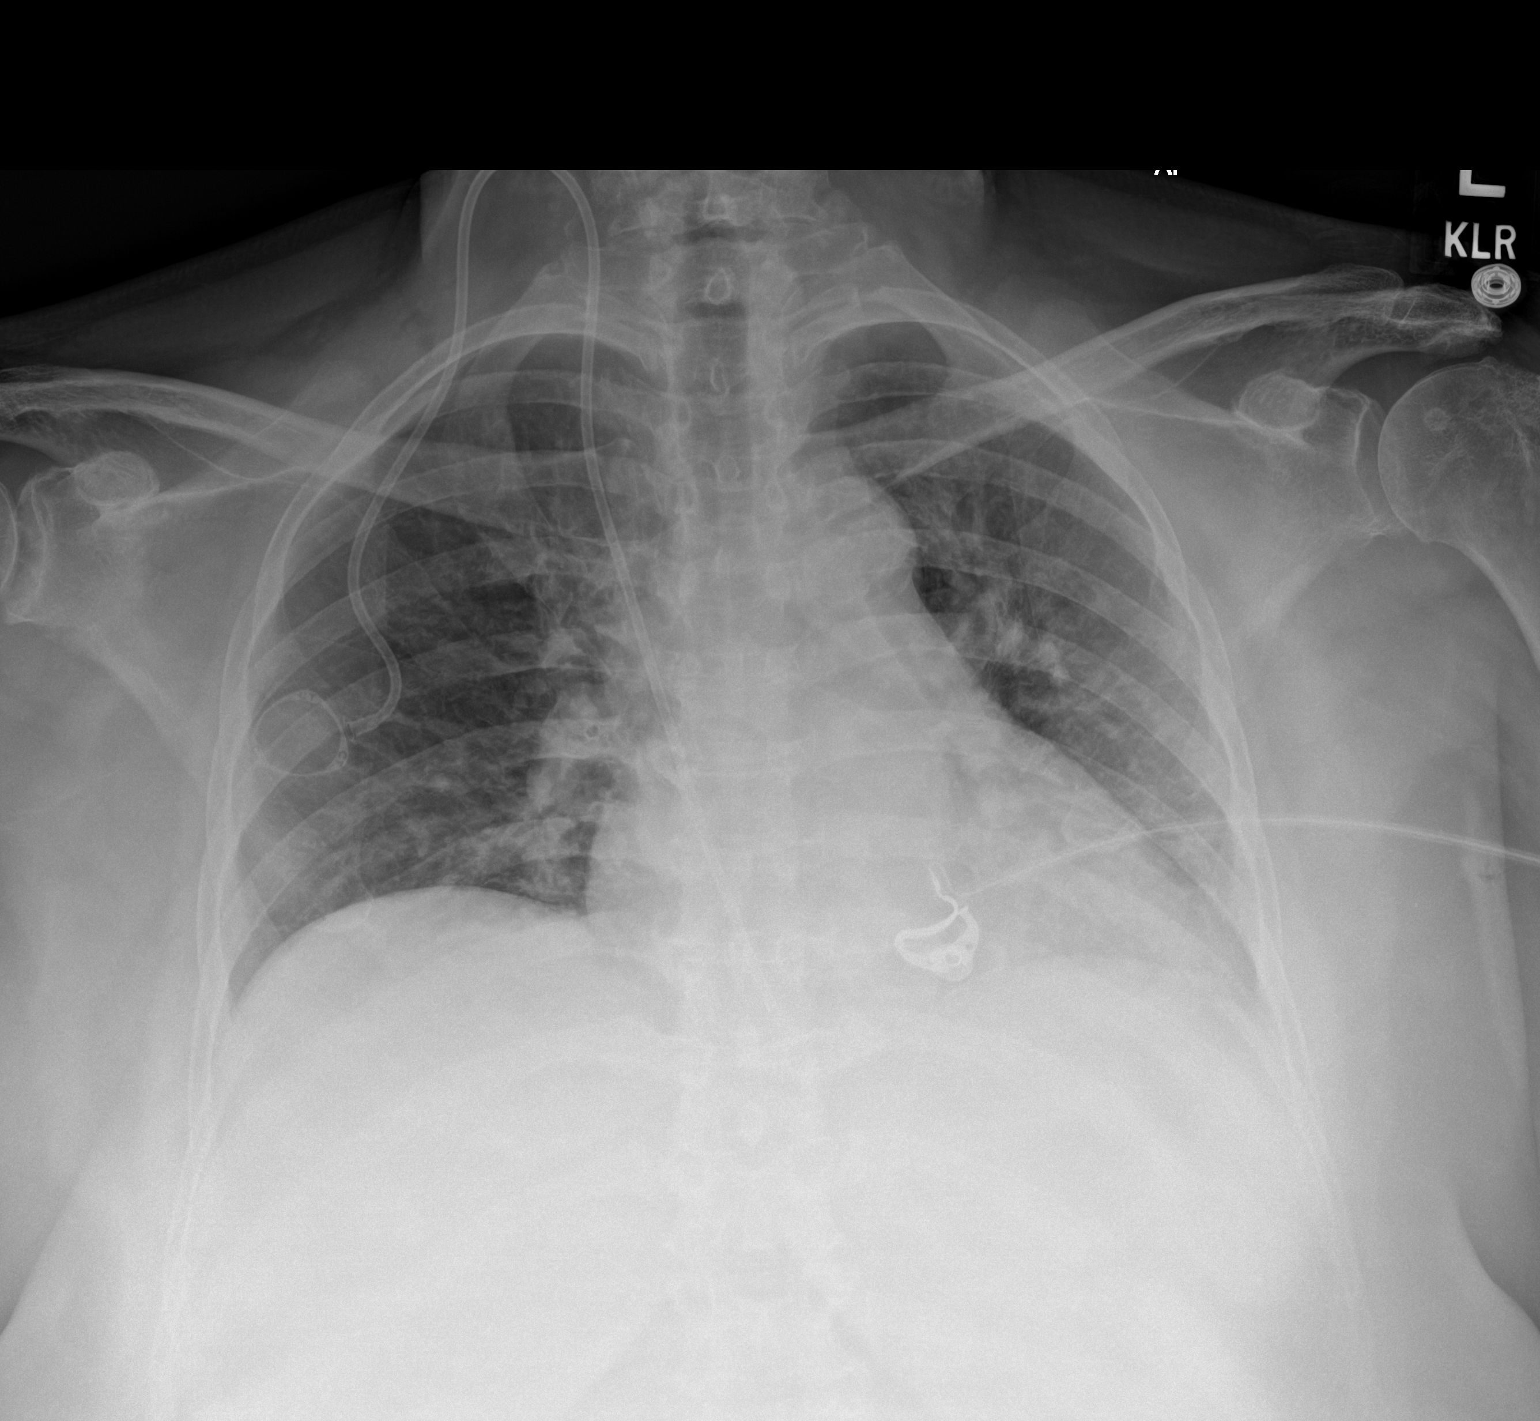

[1 of 1 positions shown; findings below may reference images not displayed]

FINDINGS: Right IJ Port-A-Cath tip is in the low right atrium. It could be
retracted approximately 6 cm.

No pneumothorax or pleural effusion. Heart is normal in size.
IMPRESSION: Right IJ Port-A-Cath tip is in the low right atrium.

## 2020-07-02 SURGERY — INSERTION, TUNNELED CENTRAL VENOUS DEVICE, WITH PORT
Anesthesia: General | Site: Chest | Laterality: Right

## 2020-07-02 MED ORDER — FENTANYL CITRATE (PF) 100 MCG/2ML IJ SOLN
25.0000 ug | INTRAMUSCULAR | Status: DC | PRN
Start: 2020-07-02 — End: 2020-07-02

## 2020-07-02 MED ORDER — PROPOFOL 10 MG/ML IV BOLUS
INTRAVENOUS | Status: DC | PRN
  Administered 2020-07-02 (×2): 20 mg via INTRAVENOUS
  Administered 2020-07-02 (×2): 30 mg via INTRAVENOUS
  Administered 2020-07-02 (×4): 20 mg via INTRAVENOUS

## 2020-07-02 MED ORDER — FENTANYL CITRATE (PF) 100 MCG/2ML IJ SOLN
INTRAMUSCULAR | Status: DC | PRN
  Administered 2020-07-02: 50 ug via INTRAVENOUS

## 2020-07-02 MED ORDER — LIDOCAINE-EPINEPHRINE 1 %-1:100000 IJ SOLN
INTRAMUSCULAR | Status: DC | PRN
  Administered 2020-07-02: 13 mL

## 2020-07-02 MED ORDER — SODIUM CHLORIDE 0.9 % IV SOLN
INTRAVENOUS | Status: DC | PRN
  Administered 2020-07-02: 5 mL via INTRAMUSCULAR

## 2020-07-02 MED ORDER — DEXAMETHASONE SODIUM PHOSPHATE 10 MG/ML IJ SOLN
INTRAMUSCULAR | Status: DC | PRN
  Administered 2020-07-02: 8 mg via INTRAVENOUS

## 2020-07-02 MED ORDER — ORAL CARE MOUTH RINSE: 15.0000 mL | Freq: Once | OROMUCOSAL | Status: AC

## 2020-07-02 MED ORDER — SODIUM CHLORIDE 0.9 % IV SOLN
INTRAVENOUS | Status: DC
  Administered 2020-07-02: 50 mL/h via INTRAVENOUS

## 2020-07-02 MED ORDER — ACETAMINOPHEN 500 MG PO TABS: 1000.0000 mg | ORAL_TABLET | ORAL | Status: AC

## 2020-07-02 MED ORDER — FENTANYL CITRATE (PF) 100 MCG/2ML IJ SOLN
INTRAMUSCULAR | Status: AC
  Filled 2020-07-02: qty 2

## 2020-07-02 MED ORDER — MIDAZOLAM HCL 2 MG/2ML IJ SOLN
INTRAMUSCULAR | Status: DC | PRN
  Administered 2020-07-02: 2 mg via INTRAVENOUS

## 2020-07-02 MED ORDER — FAMOTIDINE 20 MG PO TABS
ORAL_TABLET | ORAL | Status: AC
  Administered 2020-07-02: 20 mg via ORAL
  Filled 2020-07-02: qty 1

## 2020-07-02 MED ORDER — CHLORHEXIDINE GLUCONATE 0.12 % MT SOLN
OROMUCOSAL | Status: AC
  Administered 2020-07-02: 15 mL via OROMUCOSAL
  Filled 2020-07-02: qty 15

## 2020-07-02 MED ORDER — CEFAZOLIN SODIUM-DEXTROSE 2-4 GM/100ML-% IV SOLN: 2.0000 g | INTRAVENOUS | Status: DC

## 2020-07-02 MED ORDER — ACETAMINOPHEN 500 MG PO TABS
ORAL_TABLET | ORAL | Status: AC
  Administered 2020-07-02: 1000 mg via ORAL
  Filled 2020-07-02: qty 2

## 2020-07-02 MED ORDER — PROPOFOL 500 MG/50ML IV EMUL
INTRAVENOUS | Status: DC | PRN
  Administered 2020-07-02: 50 ug/kg/min via INTRAVENOUS

## 2020-07-02 MED ORDER — PROPOFOL 10 MG/ML IV BOLUS
INTRAVENOUS | Status: AC
  Filled 2020-07-02: qty 20

## 2020-07-02 MED ORDER — ONDANSETRON HCL 4 MG/2ML IJ SOLN: 4.0000 mg | Freq: Once | INTRAMUSCULAR | Status: DC | PRN

## 2020-07-02 MED ORDER — CHLORHEXIDINE GLUCONATE 0.12 % MT SOLN: 15.0000 mL | Freq: Once | OROMUCOSAL | Status: AC

## 2020-07-02 MED ORDER — CEFAZOLIN SODIUM-DEXTROSE 2-4 GM/100ML-% IV SOLN
INTRAVENOUS | Status: AC
  Filled 2020-07-02: qty 100

## 2020-07-02 MED ORDER — CHLORHEXIDINE GLUCONATE CLOTH 2 % EX PADS: 6.0000 | MEDICATED_PAD | Freq: Once | CUTANEOUS | Status: DC

## 2020-07-02 MED ORDER — LIDOCAINE-EPINEPHRINE 1 %-1:100000 IJ SOLN
INTRAMUSCULAR | Status: AC
  Filled 2020-07-02: qty 1

## 2020-07-02 MED ORDER — PROPOFOL 500 MG/50ML IV EMUL
INTRAVENOUS | Status: AC
  Filled 2020-07-02: qty 50

## 2020-07-02 MED ORDER — CEFAZOLIN SODIUM-DEXTROSE 2-3 GM-%(50ML) IV SOLR
INTRAVENOUS | Status: DC | PRN
  Administered 2020-07-02: 2 g via INTRAVENOUS

## 2020-07-02 MED ORDER — FAMOTIDINE 20 MG PO TABS: 20.0000 mg | ORAL_TABLET | Freq: Once | ORAL | Status: AC

## 2020-07-02 MED ORDER — MIDAZOLAM HCL 2 MG/2ML IJ SOLN
INTRAMUSCULAR | Status: AC
  Filled 2020-07-02: qty 2

## 2020-07-02 MED ORDER — HEPARIN SODIUM (PORCINE) 5000 UNIT/ML IJ SOLN
INTRAMUSCULAR | Status: AC
  Filled 2020-07-02: qty 1

## 2020-07-02 MED ORDER — ONDANSETRON HCL 4 MG/2ML IJ SOLN
INTRAMUSCULAR | Status: DC | PRN
  Administered 2020-07-02: 4 mg via INTRAVENOUS

## 2020-07-02 MED ORDER — BUPIVACAINE LIPOSOME 1.3 % IJ SUSP: 20.0000 mL | Freq: Once | INTRAMUSCULAR | Status: DC

## 2020-07-02 MED ORDER — BUPIVACAINE HCL (PF) 0.25 % IJ SOLN
INTRAMUSCULAR | Status: AC
  Filled 2020-07-02: qty 30

## 2020-07-02 SURGICAL SUPPLY — 40 items
ADH SKN CLS APL DERMABOND .7 (GAUZE/BANDAGES/DRESSINGS) ×1
APL PRP STRL LF DISP 70% ISPRP (MISCELLANEOUS) ×1
BAG DECANTER FOR FLEXI CONT (MISCELLANEOUS) ×6 IMPLANT
BLADE SURG 15 STRL LF DISP TIS (BLADE) ×1 IMPLANT
BLADE SURG 15 STRL SS (BLADE) ×2
BLADE SURG SZ11 CARB STEEL (BLADE) ×2 IMPLANT
BOOT SUTURE AID YELLOW STND (SUTURE) ×2 IMPLANT
CANISTER SUCT 1200ML W/VALVE (MISCELLANEOUS) ×2 IMPLANT
CHLORAPREP W/TINT 26 (MISCELLANEOUS) ×2 IMPLANT
COVER WAND RF STERILE (DRAPES) ×2 IMPLANT
DERMABOND ADVANCED (GAUZE/BANDAGES/DRESSINGS) ×1
DERMABOND ADVANCED .7 DNX12 (GAUZE/BANDAGES/DRESSINGS) ×1 IMPLANT
DRAPE C-ARM XRAY 36X54 (DRAPES) ×2 IMPLANT
ELECT CAUTERY BLADE 6.4 (BLADE) ×2 IMPLANT
ELECT CAUTERY BLADE TIP 2.5 (TIP) ×2
ELECT REM PT RETURN 9FT ADLT (ELECTROSURGICAL) ×2
ELECTRODE CAUTERY BLDE TIP 2.5 (TIP) ×1 IMPLANT
ELECTRODE REM PT RTRN 9FT ADLT (ELECTROSURGICAL) ×1 IMPLANT
GLOVE BIO SURGEON STRL SZ 6.5 (GLOVE) ×2 IMPLANT
GLOVE INDICATOR 7.0 STRL GRN (GLOVE) ×2 IMPLANT
GOWN STRL REUS W/ TWL LRG LVL3 (GOWN DISPOSABLE) ×2 IMPLANT
GOWN STRL REUS W/TWL LRG LVL3 (GOWN DISPOSABLE) ×4
IV NS 500ML (IV SOLUTION) ×4
IV NS 500ML BAXH (IV SOLUTION) ×2 IMPLANT
KIT PORT POWER 8FR ISP CVUE (Port) ×2 IMPLANT
MANIFOLD NEPTUNE II (INSTRUMENTS) ×2 IMPLANT
NEEDLE FILTER BLUNT 18X 1/2SAF (NEEDLE) ×1
NEEDLE FILTER BLUNT 18X1 1/2 (NEEDLE) ×1 IMPLANT
NEEDLE HYPO 22GX1.5 SAFETY (NEEDLE) ×2 IMPLANT
PACK PORT-A-CATH (MISCELLANEOUS) ×2 IMPLANT
STRIP CLOSURE SKIN 1/2X4 (GAUZE/BANDAGES/DRESSINGS) ×2 IMPLANT
SUT MNCRL 4-0 (SUTURE) ×2
SUT MNCRL 4-0 27XMFL (SUTURE) ×1
SUT PROLENE 2 0 SH DA (SUTURE) ×4 IMPLANT
SUT VIC AB 3-0 SH 27 (SUTURE) ×2
SUT VIC AB 3-0 SH 27X BRD (SUTURE) ×1 IMPLANT
SUTURE MNCRL 4-0 27XMF (SUTURE) ×1 IMPLANT
SYR 10ML LL (SYRINGE) ×2 IMPLANT
SYR 20ML LL LF (SYRINGE) ×2 IMPLANT
SYR 3ML LL SCALE MARK (SYRINGE) ×2 IMPLANT

## 2020-07-02 NOTE — Anesthesia Procedure Notes (Signed)
Date/Time: 07/02/2020 10:20 AM Performed by: Allean Found, CRNA Pre-anesthesia Checklist: Patient identified, Emergency Drugs available, Suction available, Patient being monitored and Timeout performed Patient Re-evaluated:Patient Re-evaluated prior to induction Oxygen Delivery Method: Nasal cannula Placement Confirmation: positive ETCO2

## 2020-07-02 NOTE — Patient Instructions (Signed)
 Cisplatin injection What is this medicine? CISPLATIN (SIS pla tin) is a chemotherapy drug. It targets fast dividing cells, like cancer cells, and causes these cells to die. This medicine is used to treat many types of cancer like bladder, ovarian, and testicular cancers. This medicine may be used for other purposes; ask your health care provider or pharmacist if you have questions. COMMON BRAND NAME(S): Platinol, Platinol -AQ What should I tell my health care provider before I take this medicine? They need to know if you have any of these conditions:  eye disease, vision problems  hearing problems  kidney disease  low blood counts, like white cells, platelets, or red blood cells  tingling of the fingers or toes, or other nerve disorder  an unusual or allergic reaction to cisplatin, carboplatin, oxaliplatin, other medicines, foods, dyes, or preservatives  pregnant or trying to get pregnant  breast-feeding How should I use this medicine? This drug is given as an infusion into a vein. It is administered in a hospital or clinic by a specially trained health care professional. Talk to your pediatrician regarding the use of this medicine in children. Special care may be needed. Overdosage: If you think you have taken too much of this medicine contact a poison control center or emergency room at once. NOTE: This medicine is only for you. Do not share this medicine with others. What if I miss a dose? It is important not to miss a dose. Call your doctor or health care professional if you are unable to keep an appointment. What may interact with this medicine? This medicine may interact with the following medications:  foscarnet  certain antibiotics like amikacin, gentamicin, neomycin, polymyxin B, streptomycin, tobramycin, vancomycin This list may not describe all possible interactions. Give your health care provider a list of all the medicines, herbs, non-prescription drugs, or  dietary supplements you use. Also tell them if you smoke, drink alcohol, or use illegal drugs. Some items may interact with your medicine. What should I watch for while using this medicine? Your condition will be monitored carefully while you are receiving this medicine. You will need important blood work done while you are taking this medicine. This drug may make you feel generally unwell. This is not uncommon, as chemotherapy can affect healthy cells as well as cancer cells. Report any side effects. Continue your course of treatment even though you feel ill unless your doctor tells you to stop. This medicine may increase your risk of getting an infection. Call your healthcare professional for advice if you get a fever, chills, or sore throat, or other symptoms of a cold or flu. Do not treat yourself. Try to avoid being around people who are sick. Avoid taking medicines that contain aspirin, acetaminophen, ibuprofen, naproxen, or ketoprofen unless instructed by your healthcare professional. These medicines may hide a fever. This medicine may increase your risk to bruise or bleed. Call your doctor or health care professional if you notice any unusual bleeding. Be careful brushing and flossing your teeth or using a toothpick because you may get an infection or bleed more easily. If you have any dental work done, tell your dentist you are receiving this medicine. Do not become pregnant while taking this medicine or for 14 months after stopping it. Women should inform their healthcare professional if they wish to become pregnant or think they might be pregnant. Men should not father a child while taking this medicine and for 11 months after stopping it. There is potential   for serious side effects to an unborn child. Talk to your healthcare professional for more information. Do not breast-feed an infant while taking this medicine. This medicine has caused ovarian failure in some women. This medicine may make  it more difficult to get pregnant. Talk to your healthcare professional if you are concerned about your fertility. This medicine has caused decreased sperm counts in some men. This may make it more difficult to father a child. Talk to your healthcare professional if you are concerned about your fertility. Drink fluids as directed while you are taking this medicine. This will help protect your kidneys. Call your doctor or health care professional if you get diarrhea. Do not treat yourself. What side effects may I notice from receiving this medicine? Side effects that you should report to your doctor or health care professional as soon as possible:  allergic reactions like skin rash, itching or hives, swelling of the face, lips, or tongue  blurred vision  changes in vision  decreased hearing or ringing of the ears  nausea, vomiting  pain, redness, or irritation at site where injected  pain, tingling, numbness in the hands or feet  signs and symptoms of bleeding such as bloody or black, tarry stools; red or dark brown urine; spitting up blood or brown material that looks like coffee grounds; red spots on the skin; unusual bruising or bleeding from the eyes, gums, or nose  signs and symptoms of infection like fever; chills; cough; sore throat; pain or trouble passing urine  signs and symptoms of kidney injury like trouble passing urine or change in the amount of urine  signs and symptoms of low red blood cells or anemia such as unusually weak or tired; feeling faint or lightheaded; falls; breathing problems Side effects that usually do not require medical attention (report to your doctor or health care professional if they continue or are bothersome):  loss of appetite  mouth sores  muscle cramps This list may not describe all possible side effects. Call your doctor for medical advice about side effects. You may report side effects to FDA at 1-800-FDA-1088. Where should I keep my  medicine? This drug is given in a hospital or clinic and will not be stored at home. NOTE: This sheet is a summary. It may not cover all possible information. If you have questions about this medicine, talk to your doctor, pharmacist, or health care provider.  2020 Elsevier/Gold Standard (2018-07-05 15:59:17)   Gemcitabine injection What is this medicine? GEMCITABINE (jem SYE ta been) is a chemotherapy drug. This medicine is used to treat many types of cancer like breast cancer, lung cancer, pancreatic cancer, and ovarian cancer. This medicine may be used for other purposes; ask your health care provider or pharmacist if you have questions. COMMON BRAND NAME(S): Gemzar, Infugem What should I tell my health care provider before I take this medicine? They need to know if you have any of these conditions:  blood disorders  infection  kidney disease  liver disease  lung or breathing disease, like asthma  recent or ongoing radiation therapy  an unusual or allergic reaction to gemcitabine, other chemotherapy, other medicines, foods, dyes, or preservatives  pregnant or trying to get pregnant  breast-feeding How should I use this medicine? This drug is given as an infusion into a vein. It is administered in a hospital or clinic by a specially trained health care professional. Talk to your pediatrician regarding the use of this medicine in children. Special care may   be needed. Overdosage: If you think you have taken too much of this medicine contact a poison control center or emergency room at once. NOTE: This medicine is only for you. Do not share this medicine with others. What if I miss a dose? It is important not to miss your dose. Call your doctor or health care professional if you are unable to keep an appointment. What may interact with this medicine?  medicines to increase blood counts like filgrastim, pegfilgrastim, sargramostim  some other chemotherapy drugs like  cisplatin  vaccines Talk to your doctor or health care professional before taking any of these medicines:  acetaminophen  aspirin  ibuprofen  ketoprofen  naproxen This list may not describe all possible interactions. Give your health care provider a list of all the medicines, herbs, non-prescription drugs, or dietary supplements you use. Also tell them if you smoke, drink alcohol, or use illegal drugs. Some items may interact with your medicine. What should I watch for while using this medicine? Visit your doctor for checks on your progress. This drug may make you feel generally unwell. This is not uncommon, as chemotherapy can affect healthy cells as well as cancer cells. Report any side effects. Continue your course of treatment even though you feel ill unless your doctor tells you to stop. In some cases, you may be given additional medicines to help with side effects. Follow all directions for their use. Call your doctor or health care professional for advice if you get a fever, chills or sore throat, or other symptoms of a cold or flu. Do not treat yourself. This drug decreases your body's ability to fight infections. Try to avoid being around people who are sick. This medicine may increase your risk to bruise or bleed. Call your doctor or health care professional if you notice any unusual bleeding. Be careful brushing and flossing your teeth or using a toothpick because you may get an infection or bleed more easily. If you have any dental work done, tell your dentist you are receiving this medicine. Avoid taking products that contain aspirin, acetaminophen, ibuprofen, naproxen, or ketoprofen unless instructed by your doctor. These medicines may hide a fever. Do not become pregnant while taking this medicine or for 6 months after stopping it. Women should inform their doctor if they wish to become pregnant or think they might be pregnant. Men should not father a child while taking this  medicine and for 3 months after stopping it. There is a potential for serious side effects to an unborn child. Talk to your health care professional or pharmacist for more information. Do not breast-feed an infant while taking this medicine or for at least 1 week after stopping it. Men should inform their doctors if they wish to father a child. This medicine may lower sperm counts. Talk with your doctor or health care professional if you are concerned about your fertility. What side effects may I notice from receiving this medicine? Side effects that you should report to your doctor or health care professional as soon as possible:  allergic reactions like skin rash, itching or hives, swelling of the face, lips, or tongue  breathing problems  pain, redness, or irritation at site where injected  signs and symptoms of a dangerous change in heartbeat or heart rhythm like chest pain; dizziness; fast or irregular heartbeat; palpitations; feeling faint or lightheaded, falls; breathing problems  signs of decreased platelets or bleeding - bruising, pinpoint red spots on the skin, black, tarry stools, blood   in the urine  signs of decreased red blood cells - unusually weak or tired, feeling faint or lightheaded, falls  signs of infection - fever or chills, cough, sore throat, pain or difficulty passing urine  signs and symptoms of kidney injury like trouble passing urine or change in the amount of urine  signs and symptoms of liver injury like dark yellow or brown urine; general ill feeling or flu-like symptoms; light-colored stools; loss of appetite; nausea; right upper belly pain; unusually weak or tired; yellowing of the eyes or skin  swelling of ankles, feet, hands Side effects that usually do not require medical attention (report to your doctor or health care professional if they continue or are bothersome):  constipation  diarrhea  hair loss  loss of  appetite  nausea  rash  vomiting This list may not describe all possible side effects. Call your doctor for medical advice about side effects. You may report side effects to FDA at 1-800-FDA-1088. Where should I keep my medicine? This drug is given in a hospital or clinic and will not be stored at home. NOTE: This sheet is a summary. It may not cover all possible information. If you have questions about this medicine, talk to your doctor, pharmacist, or health care provider.  2020 Elsevier/Gold Standard (2017-10-03 18:06:11)  

## 2020-07-02 NOTE — Transfer of Care (Signed)
Immediate Anesthesia Transfer of Care Note  Patient: Kristina Orozco  Procedure(s) Performed: INSERTION PORT-A-CATH, chemotherapy (Right Chest)  Patient Location: PACU  Anesthesia Type:General  Level of Consciousness: awake, alert  and oriented  Airway & Oxygen Therapy: Patient Spontanous Breathing and Patient connected to nasal cannula oxygen  Post-op Assessment: Report given to RN and Post -op Vital signs reviewed and stable  Post vital signs: Reviewed and stable  Last Vitals:  Vitals Value Taken Time  BP 113/66 07/02/20 1136  Temp    Pulse 88 07/02/20 1137  Resp 6 07/02/20 1137  SpO2 99 % 07/02/20 1137  Vitals shown include unvalidated device data.  Last Pain:  Vitals:   07/02/20 0917  TempSrc: Tympanic  PainSc: 0-No pain      Patients Stated Pain Goal: 0 (12/45/80 9983)  Complications: No complications documented.

## 2020-07-02 NOTE — Discharge Instructions (Signed)

## 2020-07-02 NOTE — Interval H&P Note (Signed)
History and Physical Interval Note:  07/02/2020 9:52 AM  Kristina Orozco  has presented today for surgery, with the diagnosis of Gallbladder cancer.  The various methods of treatment have been discussed with the patient and family. After consideration of risks, benefits and other options for treatment, the patient has consented to  Procedure(s): INSERTION PORT-A-CATH, chemotherapy (N/A) as a surgical intervention.  The patient's history has been reviewed, patient examined, no change in status, stable for surgery.  I have reviewed the patient's chart and labs.  Questions were answered to the patient's satisfaction.     Fredirick Maudlin

## 2020-07-02 NOTE — Anesthesia Preprocedure Evaluation (Signed)
Anesthesia Evaluation  Patient identified by MRN, date of birth, ID band Patient awake    Reviewed: Allergy & Precautions, NPO status , Patient's Chart, lab work & pertinent test results  History of Anesthesia Complications Negative for: history of anesthetic complications  Airway Mallampati: II       Dental  (+) Missing, Chipped   Pulmonary neg sleep apnea, neg COPD, Not current smoker,           Cardiovascular hypertension, Pt. on medications (-) Past MI and (-) CHF (-) dysrhythmias (-) Valvular Problems/Murmurs     Neuro/Psych neg Seizures    GI/Hepatic Neg liver ROS, neg GERD  ,  Endo/Other  diabetes (diet controlled), Type 2  Renal/GU negative Renal ROS     Musculoskeletal   Abdominal   Peds  Hematology   Anesthesia Other Findings   Reproductive/Obstetrics                             Anesthesia Physical Anesthesia Plan  ASA: III  Anesthesia Plan: General   Post-op Pain Management:    Induction: Intravenous  PONV Risk Score and Plan: 3 and Propofol infusion, TIVA and Treatment may vary due to age or medical condition  Airway Management Planned: Nasal Cannula  Additional Equipment:   Intra-op Plan:   Post-operative Plan:   Informed Consent: I have reviewed the patients History and Physical, chart, labs and discussed the procedure including the risks, benefits and alternatives for the proposed anesthesia with the patient or authorized representative who has indicated his/her understanding and acceptance.       Plan Discussed with:   Anesthesia Plan Comments:         Anesthesia Quick Evaluation

## 2020-07-02 NOTE — Op Note (Signed)
  Pre-operative Diagnosis: Gallbladder cancer  Post-operative Diagnosis: Same   Surgeon: Fredirick Maudlin, MD  Anesthesia: IV sedation, bupivicaine 0.25% with epinephrine and lidocaine 1% without epinephrine  Procedure:  right IJ Port placement with fluoroscopy under U/S guidance  Findings: Needle and wire visualized within target vessel by ultrasound Good position of the tip of the catheter by fluoroscopy  Estimated Blood Loss: Minimal         Drains: None         Specimens: None       Complications: None immediately apparent         Procedure Details  The patient was seen again in the preoperative holding area. The benefits, complications, treatment options, and expected outcomes were discussed with the patient. The risks of bleeding, infection, recurrence of symptoms, failure to resolve symptoms, thrombosis, nonfunction, breakage, pneumothorax, and/or hemopneumothorax, any of which could require chest tube or further surgery were reviewed with the patient. The patient agreed to accept these risks. The patient was taken to the operating room, identified as Kristina Orozco and the procedure verified. A time out was held and the above information confirmed.  Prior to the induction of general anesthesia, antibiotic prophylaxis was administered. VTE prophylaxis was in place. Appropriate anesthesia was then administered and tolerated well. The chest was prepped with Chloraprep and draped in standard sterile fashion. The patient was positioned in the supine position. Then the patient was placed in Trendelenburg position.  Local anesthetic was infiltrated into the skin and subcutaneous tissues in the neck and anterior chest wall. A large bore needle was placed into the internal jugular vein under U/S guidance without difficulty and then the Seldinger wire was advanced. Fluoroscopy was utilized to confirm that the Seldinger wire was in the superior vena cava.  An incision was made and a port  pocket developed with blunt and electrocautery dissection. The introducer dilator was placed over the Seldinger wire and the wire was removed.  A tunnel was made from the introducer dilator to the chest wall pocket and the catheter was passed between the 2 sites.  The previously flushed catheter was placed into the introducer dilator and the peel-away sheath was removed. The catheter length was confirmed and trimmed utilizing fluoroscopy for proper positioning. The catheter was then attached to the previously flushed port. The port was placed into the pocket. The port was secured in situ with 2-0 Prolene sutures, flushed to confirm function and heparin locked.  The wound was closed with interrupted 3-0 Vicryl followed by 4-0 subcuticular Monocryl sutures. Dermabond was applied.  The patient was taken to the recovery room in stable condition where a postoperative chest film has been ordered.

## 2020-07-02 NOTE — Anesthesia Postprocedure Evaluation (Signed)
Anesthesia Post Note  Patient: Kristina Orozco  Procedure(s) Performed: INSERTION PORT-A-CATH, chemotherapy (Right Chest)  Patient location during evaluation: PACU Anesthesia Type: General Level of consciousness: awake and alert Pain management: pain level controlled Vital Signs Assessment: post-procedure vital signs reviewed and stable Respiratory status: spontaneous breathing and respiratory function stable Cardiovascular status: stable Anesthetic complications: no   No complications documented.   Last Vitals:  Vitals:   07/02/20 0917 07/02/20 1137  BP: 136/74 113/66  Pulse: 97 86  Resp: 15 11  Temp: (!) 35.9 C 36.6 C  SpO2: 97% 100%    Last Pain:  Vitals:   07/02/20 1137  TempSrc:   PainSc: 0-No pain                 [name] K

## 2020-07-05 ENCOUNTER — Telehealth: Payer: Self-pay

## 2020-07-05 ENCOUNTER — Inpatient Hospital Stay: Payer: Medicare Other

## 2020-07-05 ENCOUNTER — Telehealth: Payer: Self-pay | Admitting: *Deleted

## 2020-07-05 MED ORDER — LIDOCAINE-PRILOCAINE 2.5-2.5 % EX CREA: 1.0000 "application " | TOPICAL_CREAM | CUTANEOUS | 0 refills | Status: DC | PRN

## 2020-07-05 NOTE — Telephone Encounter (Signed)
Patient contacted. [facility] does not have emla cream in stock. Patient prefers rx to be sent to [facility]. Medication routed to patient's pharmacy preference.

## 2020-07-05 NOTE — Telephone Encounter (Signed)
Called pt to schedule an appt with Dr. Celine Ahr due to port catheter being too long. Pt was unable to come in this week due to other engagements. Pt agreed to come in on Tuesday 07/13/2020 @ 10:30 am so Dr. Celine Ahr can address this issue.

## 2020-07-07 ENCOUNTER — Encounter: Payer: Self-pay | Admitting: Internal Medicine

## 2020-07-07 ENCOUNTER — Inpatient Hospital Stay: Payer: Medicare Other

## 2020-07-07 ENCOUNTER — Inpatient Hospital Stay (HOSPITAL_BASED_OUTPATIENT_CLINIC_OR_DEPARTMENT_OTHER): Payer: Medicare Other | Admitting: Internal Medicine

## 2020-07-07 VITALS — BP 138/87 | HR 85 | Resp 16

## 2020-07-07 DIAGNOSIS — C23 Malignant neoplasm of gallbladder: Secondary | ICD-10-CM

## 2020-07-07 DIAGNOSIS — Z7189 Other specified counseling: Secondary | ICD-10-CM | POA: Diagnosis not present

## 2020-07-07 DIAGNOSIS — Z5111 Encounter for antineoplastic chemotherapy: Secondary | ICD-10-CM | POA: Diagnosis not present

## 2020-07-07 LAB — CBC WITH DIFFERENTIAL/PLATELET
Abs Immature Granulocytes: 0.03 10*3/uL (ref 0.00–0.07)
Basophils Absolute: 0.1 10*3/uL (ref 0.0–0.1)
Basophils Relative: 1 %
Eosinophils Absolute: 0.3 10*3/uL (ref 0.0–0.5)
Eosinophils Relative: 3 %
HCT: 37.4 % (ref 36.0–46.0)
Hemoglobin: 12.1 g/dL (ref 12.0–15.0)
Immature Granulocytes: 0 %
Lymphocytes Relative: 30 %
Lymphs Abs: 2.9 10*3/uL (ref 0.7–4.0)
MCH: 26.8 pg (ref 26.0–34.0)
MCHC: 32.4 g/dL (ref 30.0–36.0)
MCV: 82.7 fL (ref 80.0–100.0)
Monocytes Absolute: 1.1 10*3/uL — ABNORMAL HIGH (ref 0.1–1.0)
Monocytes Relative: 11 %
Neutro Abs: 5.2 10*3/uL (ref 1.7–7.7)
Neutrophils Relative %: 55 %
Platelets: 316 10*3/uL (ref 150–400)
RBC: 4.52 MIL/uL (ref 3.87–5.11)
RDW: 14.1 % (ref 11.5–15.5)
WBC: 9.6 10*3/uL (ref 4.0–10.5)
nRBC: 0 % (ref 0.0–0.2)

## 2020-07-07 LAB — COMPREHENSIVE METABOLIC PANEL
ALT: 22 U/L (ref 0–44)
AST: 32 U/L (ref 15–41)
Albumin: 3.9 g/dL (ref 3.5–5.0)
Alkaline Phosphatase: 53 U/L (ref 38–126)
Anion gap: 7 (ref 5–15)
BUN: 11 mg/dL (ref 8–23)
CO2: 33 mmol/L — ABNORMAL HIGH (ref 22–32)
Calcium: 9.1 mg/dL (ref 8.9–10.3)
Chloride: 96 mmol/L — ABNORMAL LOW (ref 98–111)
Creatinine, Ser: 0.52 mg/dL (ref 0.44–1.00)
GFR, Estimated: >60 mL/min (ref >60)
Glucose, Bld: 241 mg/dL — ABNORMAL HIGH (ref 70–99)
Potassium: 3.4 mmol/L — ABNORMAL LOW (ref 3.5–5.1)
Sodium: 136 mmol/L (ref 135–145)
Total Bilirubin: 0.8 mg/dL (ref 0.3–1.2)
Total Protein: 7.4 g/dL (ref 6.5–8.1)

## 2020-07-07 LAB — MAGNESIUM: Magnesium: 2 mg/dL (ref 1.7–2.4)

## 2020-07-07 MED ORDER — HEPARIN SOD (PORK) LOCK FLUSH 100 UNIT/ML IV SOLN
500.0000 [IU] | Freq: Once | INTRAVENOUS | Status: AC
  Administered 2020-07-07: 500 [IU] via INTRAVENOUS
  Filled 2020-07-07: qty 5

## 2020-07-07 MED ORDER — SODIUM CHLORIDE 0.9 % IV SOLN
Freq: Once | INTRAVENOUS | Status: AC
  Filled 2020-07-07: qty 250

## 2020-07-07 MED ORDER — SODIUM CHLORIDE 0.9 % IV SOLN
25.0000 mg/m2 | Freq: Once | INTRAVENOUS | Status: AC
  Administered 2020-07-07: 53 mg via INTRAVENOUS
  Filled 2020-07-07: qty 53

## 2020-07-07 MED ORDER — HEPARIN SOD (PORK) LOCK FLUSH 100 UNIT/ML IV SOLN
INTRAVENOUS | Status: AC
  Filled 2020-07-07: qty 5

## 2020-07-07 MED ORDER — SODIUM CHLORIDE 0.9% FLUSH
10.0000 mL | INTRAVENOUS | Status: DC | PRN
  Administered 2020-07-07: 10 mL via INTRAVENOUS
  Filled 2020-07-07: qty 10

## 2020-07-07 MED ORDER — SODIUM CHLORIDE 0.9 % IV SOLN
10.0000 mg | Freq: Once | INTRAVENOUS | Status: AC
  Administered 2020-07-07: 10 mg via INTRAVENOUS
  Filled 2020-07-07: qty 10

## 2020-07-07 MED ORDER — SODIUM CHLORIDE 0.9 % IV SOLN
2000.0000 mg | Freq: Once | INTRAVENOUS | Status: AC
  Administered 2020-07-07: 2000 mg via INTRAVENOUS
  Filled 2020-07-07: qty 52.6

## 2020-07-07 MED ORDER — PALONOSETRON HCL INJECTION 0.25 MG/5ML
0.2500 mg | Freq: Once | INTRAVENOUS | Status: AC
  Administered 2020-07-07: 0.25 mg via INTRAVENOUS
  Filled 2020-07-07: qty 5

## 2020-07-07 MED ORDER — SODIUM CHLORIDE 0.9 % IV SOLN
Freq: Once | INTRAVENOUS | Status: AC
  Filled 2020-07-07: qty 1000

## 2020-07-07 MED ORDER — SODIUM CHLORIDE 0.9 % IV SOLN
150.0000 mg | Freq: Once | INTRAVENOUS | Status: AC
  Administered 2020-07-07: 150 mg via INTRAVENOUS
  Filled 2020-07-07: qty 150

## 2020-07-07 NOTE — Patient Instructions (Signed)
#   STOP HYDROCHLORTHIAZIDE

## 2020-07-07 NOTE — Progress Notes (Signed)
[facility] CONSULT NOTE  Patient Care Team: Revelo, Elyse Jarvis, MD as PCP - General (Family Medicine) Rico Junker, RN as Registered Nurse Christene Lye, MD (General Surgery) Clent Jacks, RN as Oncology Nurse Navigator  CHIEF COMPLAINTS/PURPOSE OF CONSULTATION: GALLBLADDER CANCER   #  Oncology History Overview Note  10/25- GALLBLADDER CANCER- [Dr.Cannon]/Dr.Byerly- laproscopic cholecystectomy- pT Category: pT3; pN1' POSITIVE for PNI; Histologic Type: Adenocarcinoma, biliary type  Histologic Grade: G3, poorly differentiated  Tumor Size: Greatest dimension: 2.2 cm  Tumor Extent: Tumor directly invades the liver  Lymphovascular Invasion: Present pre-treatement   Tumor Site: Fundus MARGINS  Margin Status for Invasive Carcinoma:      Margin Involved by Invasive Carcinoma: Liver parenchymal / hepatic  Bed; Number of Lymph Nodes with Tumor: 1       Number of Lymph Nodes Examined: 1    # October 25th 2021- Incidental gallbladder ADENOCA-s/p acute cholecystectomy [Dr.Cannon]. Stage III- T3N1; liver margin positive.  CT chest negative for any metastatic disease.  MRI abdomen with and without contrast-residual disease noted gallbladder bed; no evidence of distant metastases or lymphadenopathy. NOv 3rd 2021-CA 19-9 > 2000;     # NEOADJUVANT CHEMO-12/15-  Gem-cis [d1 & d-8 q 21 days] ; DEC 2021- Ca-19-03-4321.   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[place])   07/07/2020 -  Chemotherapy   The patient had dexamethasone (DECADRON) 4 MG tablet, 8 mg, Oral, Daily, 1 of 1 cycle, Start date: --, End date: -- palonosetron (ALOXI) injection 0.25 mg, 0.25 mg, Intravenous,   Once, 1 of 6 cycles Administration: 0.25 mg (07/07/2020) pegfilgrastim (NEULASTA ONPRO KIT) injection 6 mg, 6 mg, Subcutaneous, Once, 1 of 6 cycles CISplatin (PLATINOL) 53 mg in sodium chloride 0.9 % 250 mL chemo infusion, 25 mg/m2 = 53 mg, Intravenous,  Once, 1 of 6 cycles Administration: 53 mg (07/07/2020) gemcitabine (GEMZAR) 2,000 mg in sodium chloride 0.9 % 250 mL chemo infusion, 2,128 mg, Intravenous,  Once, 1 of 6 cycles Administration: 2,000 mg (07/07/2020) fosaprepitant (EMEND) 150 mg in sodium chloride 0.9 % 145 mL IVPB, 150 mg, Intravenous,  Once, 1 of 6 cycles Administration: 150 mg (07/07/2020)  for chemotherapy treatment.       HISTORY OF PRESENTING ILLNESS:  Kristina Orozco 64 y.o.  female incidental pathologic gallbladder cancer-is here to proceed with neoadjuvant chemotherapy.  Patient had a Mediport placement.  Her appetite is good.  No weight loss no nausea no vomiting.   Review of Systems  Constitutional: Negative for chills, diaphoresis, fever and malaise/fatigue.  HENT: Negative for nosebleeds and sore throat.   Eyes: Negative for double vision.  Respiratory: Negative for cough, hemoptysis, sputum production, shortness of breath and wheezing.   Cardiovascular: Negative for chest pain, palpitations, orthopnea and leg swelling.  Gastrointestinal: Negative for abdominal pain, blood in stool, constipation, diarrhea, heartburn, melena, nausea and vomiting.  Genitourinary: Negative for dysuria, frequency and urgency.  Musculoskeletal: Negative for back pain and joint pain.  Skin: Negative.  Negative for itching and rash.  Neurological: Negative for dizziness, tingling, focal weakness, weakness and headaches.  Endo/Heme/Allergies: Does not bruise/bleed easily.  Psychiatric/Behavioral: Negative for depression. The patient is not nervous/anxious and  does not have insomnia.      MEDICAL HISTORY:  Past Medical History:  Diagnosis Date  . Cancer ([place]) 05/17/2020    gallbladder  . Diabetes mellitus without complication ([place])   . Hypertension     SURGICAL HISTORY: Past Surgical History:  Procedure Laterality Date  . ABDOMINAL HYSTERECTOMY    . APPENDECTOMY  2004  . BREAST BIOPSY Left 2011  . BREAST BIOPSY Right 08/26/13  . 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/28/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  . CHOLECYSTECTOMY N/A 05/17/2020   Procedure: LAPAROSCOPIC CHOLECYSTECTOMY;  Surgeon: Fredirick Maudlin, MD;  Location: [facility] ORS;  Service: General;  Laterality: N/A;  . COLONOSCOPY WITH PROPOFOL N/A 01/31/2018   Procedure: COLONOSCOPY WITH PROPOFOL;  Surgeon: Jonathon Bellows, MD;  Location[facility] ENDOSCOPY;  Service: Gastroenterology;  Laterality: N/A;  . PORTACATH PLACEMENT Right 07/02/2020   Procedure: INSERTION PORT-A-CATH, chemotherapy;  Surgeon: Fredirick Maudlin, MD;  Location: [facility] ORS;  Service: General;  Laterality: Right;    SOCIAL HISTORY: Social History   Socioeconomic History  . Marital status: Single    Spouse name: Not on file  . Number of children: Not on file  . Years of education: Not on file  . Highest education level: Not on file  Occupational History  . Occupation: special needs worker  Tobacco Use  . Smoking status: Never Smoker  . Smokeless tobacco: Never Used  Vaping Use  . Vaping Use: Never used  Substance and Sexual Activity  . Alcohol use: Yes    Comment: beer on weekend  . Drug use: No  . Sexual activity: Not on file  Other Topics Concern  . Not on file  Social History Narrative   Lives in [place]; with son. 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 Never smoked; beer/iqour over weekends.    Social Determinants of Health   Financial Resource Strain: Not on file  Food Insecurity: Not on file  Transportation Needs: Not on file  Physical Activity: Not on file  Stress: Not on file  Social Connections: Not on file  Intimate Partner Violence: Not on file    FAMILY HISTORY: Family History  Problem Relation Age of Onset  .  Pancreatic cancer Mother        AT 20 years  . Breast cancer Sister        appx-55 years  . Pancreatic cancer Maternal Grandmother     ALLERGIES:  is allergic to seasonal ic [cholestatin].  MEDICATIONS:  Current Outpatient Medications  Medication Sig Dispense Refill  . acetaminophen (TYLENOL) 500 MG tablet Take 500-1,000 mg by mouth every 6 (six) hours as needed for moderate pain or headache.    . Chlorpheniramine Maleate (ALLERGY RELIEF PO) Take 1 tablet by mouth daily as needed (allergies).    . CINNAMON PO Take 1 tablet by mouth daily.    Marland Kitchen GARLIC PO Take 1 tablet by mouth daily.    . hydrochlorothiazide (HYDRODIURIL) 25 MG tablet Take 25 mg by mouth every morning.     Marland Kitchen ibuprofen (ADVIL) 200 MG tablet Take 200-400 mg by mouth every 6 (six) hours as needed for headache or moderate pain.    Marland Kitchen lidocaine-prilocaine (EMLA) cream Apply 1 application topically as needed. Apply on the port 30-45 mins prior to port access 30 g 0  . losartan (COZAAR) 25 MG tablet Take 25 mg by mouth every morning.     . Multiple Vitamins-Minerals (MULTIVITAMIN WITH MINERALS) tablet Take 1 tablet by mouth daily.    . ondansetron (ZOFRAN) 8 MG  tablet One pill every 8 hours as needed for nausea/vomitting. (Patient taking differently: Take 8 mg by mouth every 8 (eight) hours as needed for nausea or vomiting.) 40 tablet 1  . pravastatin (PRAVACHOL) 10 MG tablet Take 10 mg by mouth at bedtime.    . prochlorperazine (COMPAZINE) 10 MG tablet Take 1 tablet (10 mg total) by mouth every 6 (six) hours as needed for nausea or vomiting. 40 tablet 1   No current facility-administered medications for this visit.      Marland Kitchen  PHYSICAL EXAMINATION: ECOG PERFORMANCE STATUS: 0 - Asymptomatic  Vitals:   07/07/20 0850  BP: 138/79  Pulse: 88  Resp: 16  Temp: 97.7 F (36.5 C)  SpO2: 98%   Filed Weights   07/07/20 0850  Weight: 212 lb 6.4 oz (96.3 kg)    Physical Exam HENT:     Head: Normocephalic and atraumatic.      Mouth/Throat:     Pharynx: No oropharyngeal exudate.  Eyes:     Pupils: Pupils are equal, round, and reactive to light.  Cardiovascular:     Rate and Rhythm: Normal rate and regular rhythm.  Pulmonary:     Effort: Pulmonary effort is normal. No respiratory distress.     Breath sounds: Normal breath sounds. No wheezing.  Abdominal:     General: Bowel sounds are normal. There is no distension.     Palpations: Abdomen is soft. There is no mass.     Tenderness: There is no abdominal tenderness. There is no guarding or rebound.  Musculoskeletal:        General: No tenderness. Normal range of motion.     Cervical back: Normal range of motion and neck supple.  Skin:    General: Skin is warm.  Neurological:     Mental Status: She is alert and oriented to person, place, and time.  Psychiatric:        Mood and Affect: Affect normal.      LABORATORY DATA:  I have reviewed the data as listed Lab Results  Component Value Date   WBC 9.6 07/07/2020   HGB 12.1 07/07/2020   HCT 37.4 07/07/2020   MCV 82.7 07/07/2020   PLT 316 07/07/2020   Recent Labs    05/26/20 1231 06/10/20 1018 07/01/20 0833 07/07/20 0824  NA 140 136  --  136  K 3.3* 3.2* 3.4* 3.4*  CL 96* 98  --  96*  CO2 33* 27  --  33*  GLUCOSE 135* 213*  --  241*  BUN 9 12  --  11  CREATININE 0.48 0.64  --  0.52  CALCIUM 9.5 8.9  --  9.1  GFRNONAA >60 >60  --  >60  PROT 8.2* 7.5  --  7.4  ALBUMIN 3.5 3.8  --  3.9  AST 31 35  --  32  ALT 29 23  --  22  ALKPHOS 106 62  --  53  BILITOT 0.9 0.5  --  0.8    RADIOGRAPHIC STUDIES: I have personally reviewed the radiological images as listed and agreed with the findings in the report. DG CHEST PORT 1 VIEW  Result Date: 07/02/2020 CLINICAL DATA:  Port-A-Cath placement. EXAM: PORTABLE CHEST 1 VIEW COMPARISON:  Chest CT 05/31/2020 FINDINGS: Right IJ Port-A-Cath tip is in the low right atrium. It could be retracted approximately 6 cm. No pneumothorax or pleural effusion.  Heart is normal in size. IMPRESSION: Right IJ Port-A-Cath tip is in the low right atrium. Electronically  Signed   By: Marijo Sanes M.D.   On: 07/02/2020 12:41   DG C-Arm 1-60 Min-No Report  Result Date: 07/02/2020 Fluoroscopy was utilized by the requesting physician.  No radiographic interpretation.    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 NOV 2021- MRI abdomen with and without contrast-residual disease noted gallbladder bed; no evidence of distant metastases or lymphadenopathy. DEC 2021- ca-19-9> 4322.  I would recommend approximately 4 months of neoadjuvant therapy [up to 6 cycles of chemotherapy].  CA 19-9 will be monitored closely.  #Proceed with cycle number 1 day 1 chemotherapy today. Labs today reviewed;  acceptable for treatment today.  Plan 4 months of chemotherapy followed by definitive/completion surgery.  #Again reviewed the potential side effects of chemotherapy including but not limited to nausea vomiting diarrhea renal failure ototoxicity neuropathy.   # DISPOSITION: #  Chemo today # follow up as planned on dec 22nd- MD; labs- cbc/cmp/mag- Gem-Cis  # follow up X- MD- in 3 weeks; labs- cbc/cmp/mag; Ca-19-9- Gem-Cis-  #  Follow up MD- 4 weeks; labs- cbc/cmp/mag- Gem-Cis-Dr.B    All questions were answered. The patient knows to call the clinic with any problems, questions or concerns.   Cammie Sickle, MD 07/13/2020 11:30 PM

## 2020-07-07 NOTE — Progress Notes (Signed)
Wants to talk about how many treatments she is supposed to get. Wants to know if it is ok to take vitamin c and black garlic. States port is "irritating" just had it placed on Friday. Wants you to take a look at it.

## 2020-07-07 NOTE — Assessment & Plan Note (Addendum)
#  Incidental gallbladder adenocarcinoma- Stage III- T3N1; liver margin positive. NOV 2021- MRI abdomen with and without contrast-residual disease noted gallbladder bed; no evidence of distant metastases or lymphadenopathy. DEC 2021- ca-19-9> 4322.  I would recommend approximately 4 months of neoadjuvant therapy [up to 6 cycles of chemotherapy].  CA 19-9 will be monitored closely.  #Proceed with cycle number 1 day 1 chemotherapy today. Labs today reviewed;  acceptable for treatment today.  Plan 4 months of chemotherapy followed by definitive/completion surgery.  #Again reviewed the potential side effects of chemotherapy including but not limited to nausea vomiting diarrhea renal failure ototoxicity neuropathy.   # DISPOSITION: #  Chemo today # follow up as planned on dec 22nd- MD; labs- cbc/cmp/mag- Gem-Cis  # follow up X- MD- in 3 weeks; labs- cbc/cmp/mag; Ca-19-9- Gem-Cis-  #  Follow up MD- 4 weeks; labs- cbc/cmp/mag- Gem-Cis-Dr.B

## 2020-07-07 NOTE — Progress Notes (Signed)
Patient tolerated infusion well. Patient and VSS. Encouraged patient to contact clinic should she have any questions or concerns. Patient verbalizes understanding and denies any further questions or concerns. Patient discharged home.

## 2020-07-08 ENCOUNTER — Inpatient Hospital Stay: Payer: Medicare Other

## 2020-07-08 ENCOUNTER — Telehealth: Payer: Self-pay

## 2020-07-08 ENCOUNTER — Inpatient Hospital Stay: Payer: Medicare Other | Admitting: Licensed Clinical Social Worker

## 2020-07-08 LAB — CANCER ANTIGEN 19-9: CA 19-9: 4322 U/mL — ABNORMAL HIGH (ref 0–35)

## 2020-07-08 NOTE — Progress Notes (Signed)
Nutrition  Patient was a no show for nutrition appointment today.  Will send scheduling a message to reschedule patient and inform provider.   Alessander Sikorski B. Zenia Resides, [place], [place] Registered Dietitian [phone] (mobile)

## 2020-07-08 NOTE — Telephone Encounter (Signed)
Telephone call to patient for follow up after receiving first infusion.   No answer but left message stating we were calling to check on them.  Encouraged patient to call for any questions or concerns.   

## 2020-07-13 ENCOUNTER — Other Ambulatory Visit: Payer: Self-pay

## 2020-07-13 ENCOUNTER — Encounter: Payer: Self-pay | Admitting: General Surgery

## 2020-07-13 ENCOUNTER — Ambulatory Visit (INDEPENDENT_AMBULATORY_CARE_PROVIDER_SITE_OTHER): Payer: Medicare Other | Admitting: General Surgery

## 2020-07-13 VITALS — BP 135/83 | HR 101 | Temp 99.0°F | Ht 66.0 in | Wt 211.2 lb

## 2020-07-13 DIAGNOSIS — Z452 Encounter for adjustment and management of vascular access device: Secondary | ICD-10-CM

## 2020-07-13 DIAGNOSIS — Z7189 Other specified counseling: Secondary | ICD-10-CM | POA: Insufficient documentation

## 2020-07-13 NOTE — Progress Notes (Signed)
Kristina Orozco is here today to follow-up her chemotherapy port placement. I placed it about 10 days ago. In the operating room, the catheter length seemed appropriate on fluoroscopy. The post procedure x-ray was delayed in getting posted to the PACS system and I was unable to see it until the patient was dressed and getting ready to leave for home. It was on that film that I noted, as did the radiologist, that the catheter was approximately 6 cm too long and was in the right atrium. I had the office contact her to ask her to come to clinic so that I could adjust the port, to pull the catheter back so that it was in a more appropriate superior vena cava position. She is here today to have that done. After further discussion with the patient, however, she stated that she is doing fine and she has no interest in having the catheter retracted. She was cautioned regarding the potential risk of arrhythmias, but she still declined to have any adjustment made to her catheter. Symptoms including palpitations and irregular heartbeat were discussed with her and she determined that she would contact her office if she experienced any of these, but that she did not want to have anything done today. She will continue with her chemotherapy under the supervision of Dr. Rogue Bussing.

## 2020-07-13 NOTE — Patient Instructions (Signed)
Dr.Cannon discussed and answered questions about the length of the patient's port with patient at today's visit. Dr.Cannon discussed her recommendations and suggested next steps with patient at today's visit.  Patient advised to give our office a call, if she any questions or concern arise.

## 2020-07-14 ENCOUNTER — Encounter: Payer: Self-pay | Admitting: Internal Medicine

## 2020-07-14 ENCOUNTER — Inpatient Hospital Stay (HOSPITAL_BASED_OUTPATIENT_CLINIC_OR_DEPARTMENT_OTHER): Payer: Medicare Other | Admitting: Internal Medicine

## 2020-07-14 ENCOUNTER — Inpatient Hospital Stay: Payer: Medicare Other

## 2020-07-14 DIAGNOSIS — C23 Malignant neoplasm of gallbladder: Secondary | ICD-10-CM

## 2020-07-14 DIAGNOSIS — Z5111 Encounter for antineoplastic chemotherapy: Secondary | ICD-10-CM | POA: Diagnosis not present

## 2020-07-14 LAB — CBC WITH DIFFERENTIAL/PLATELET
Abs Immature Granulocytes: 0.02 10*3/uL (ref 0.00–0.07)
Basophils Absolute: 0 10*3/uL (ref 0.0–0.1)
Basophils Relative: 1 %
Eosinophils Absolute: 0 10*3/uL (ref 0.0–0.5)
Eosinophils Relative: 1 %
HCT: 34.9 % — ABNORMAL LOW (ref 36.0–46.0)
Hemoglobin: 11.4 g/dL — ABNORMAL LOW (ref 12.0–15.0)
Immature Granulocytes: 1 %
Lymphocytes Relative: 51 %
Lymphs Abs: 1.9 10*3/uL (ref 0.7–4.0)
MCH: 27.1 pg (ref 26.0–34.0)
MCHC: 32.7 g/dL (ref 30.0–36.0)
MCV: 82.9 fL (ref 80.0–100.0)
Monocytes Absolute: 0.3 10*3/uL (ref 0.1–1.0)
Monocytes Relative: 7 %
Neutro Abs: 1.4 10*3/uL — ABNORMAL LOW (ref 1.7–7.7)
Neutrophils Relative %: 39 %
Platelets: 203 10*3/uL (ref 150–400)
RBC: 4.21 MIL/uL (ref 3.87–5.11)
RDW: 13.7 % (ref 11.5–15.5)
WBC: 3.7 10*3/uL — ABNORMAL LOW (ref 4.0–10.5)
nRBC: 0 % (ref 0.0–0.2)

## 2020-07-14 LAB — COMPREHENSIVE METABOLIC PANEL
ALT: 21 U/L (ref 0–44)
AST: 35 U/L (ref 15–41)
Albumin: 3.8 g/dL (ref 3.5–5.0)
Alkaline Phosphatase: 50 U/L (ref 38–126)
Anion gap: 10 (ref 5–15)
BUN: 10 mg/dL (ref 8–23)
CO2: 27 mmol/L (ref 22–32)
Calcium: 8.8 mg/dL — ABNORMAL LOW (ref 8.9–10.3)
Chloride: 100 mmol/L (ref 98–111)
Creatinine, Ser: 0.68 mg/dL (ref 0.44–1.00)
GFR, Estimated: >60 mL/min (ref >60)
Glucose, Bld: 219 mg/dL — ABNORMAL HIGH (ref 70–99)
Potassium: 3.7 mmol/L (ref 3.5–5.1)
Sodium: 137 mmol/L (ref 135–145)
Total Bilirubin: 0.5 mg/dL (ref 0.3–1.2)
Total Protein: 7.1 g/dL (ref 6.5–8.1)

## 2020-07-14 LAB — MAGNESIUM: Magnesium: 2.1 mg/dL (ref 1.7–2.4)

## 2020-07-14 MED ORDER — SODIUM CHLORIDE 0.9 % IV SOLN
10.0000 mg | Freq: Once | INTRAVENOUS | Status: AC
  Administered 2020-07-14: 10 mg via INTRAVENOUS
  Filled 2020-07-14: qty 10

## 2020-07-14 MED ORDER — HEPARIN SOD (PORK) LOCK FLUSH 100 UNIT/ML IV SOLN
INTRAVENOUS | Status: AC
  Filled 2020-07-14: qty 5

## 2020-07-14 MED ORDER — SODIUM CHLORIDE 0.9 % IV SOLN
Freq: Once | INTRAVENOUS | Status: AC
  Filled 2020-07-14: qty 250

## 2020-07-14 MED ORDER — METOPROLOL SUCCINATE ER 50 MG PO TB24: 50.0000 mg | ORAL_TABLET | Freq: Every day | ORAL | 1 refills | Status: DC

## 2020-07-14 MED ORDER — HEPARIN SOD (PORK) LOCK FLUSH 100 UNIT/ML IV SOLN
500.0000 [IU] | Freq: Once | INTRAVENOUS | Status: AC | PRN
  Administered 2020-07-14: 500 [IU]
  Filled 2020-07-14: qty 5

## 2020-07-14 MED ORDER — SODIUM CHLORIDE 0.9 % IV SOLN
150.0000 mg | Freq: Once | INTRAVENOUS | Status: AC
  Administered 2020-07-14: 150 mg via INTRAVENOUS
  Filled 2020-07-14: qty 150

## 2020-07-14 MED ORDER — SODIUM CHLORIDE 0.9 % IV SOLN
Freq: Once | INTRAVENOUS | Status: AC
  Filled 2020-07-14: qty 1000

## 2020-07-14 MED ORDER — SODIUM CHLORIDE 0.9 % IV SOLN
2000.0000 mg | Freq: Once | INTRAVENOUS | Status: AC
  Administered 2020-07-14: 2000 mg via INTRAVENOUS
  Filled 2020-07-14: qty 52.6

## 2020-07-14 MED ORDER — SODIUM CHLORIDE 0.9% FLUSH
10.0000 mL | INTRAVENOUS | Status: DC | PRN
  Filled 2020-07-14: qty 10

## 2020-07-14 MED ORDER — HEPARIN SOD (PORK) LOCK FLUSH 100 UNIT/ML IV SOLN
500.0000 [IU] | Freq: Once | INTRAVENOUS | Status: DC
  Filled 2020-07-14: qty 5

## 2020-07-14 MED ORDER — SODIUM CHLORIDE 0.9 % IV SOLN
25.0000 mg/m2 | Freq: Once | INTRAVENOUS | Status: AC
  Administered 2020-07-14: 53 mg via INTRAVENOUS
  Filled 2020-07-14: qty 53

## 2020-07-14 MED ORDER — SODIUM CHLORIDE 0.9% FLUSH
10.0000 mL | INTRAVENOUS | Status: DC | PRN
  Administered 2020-07-14: 10 mL via INTRAVENOUS
  Filled 2020-07-14: qty 10

## 2020-07-14 MED ORDER — PALONOSETRON HCL INJECTION 0.25 MG/5ML
0.2500 mg | Freq: Once | INTRAVENOUS | Status: AC
  Administered 2020-07-14: 0.25 mg via INTRAVENOUS
  Filled 2020-07-14: qty 5

## 2020-07-14 MED ORDER — PEGFILGRASTIM 6 MG/0.6ML ~~LOC~~ PSKT
6.0000 mg | PREFILLED_SYRINGE | Freq: Once | SUBCUTANEOUS | Status: AC
  Administered 2020-07-14: 6 mg via SUBCUTANEOUS
  Filled 2020-07-14: qty 0.6

## 2020-07-14 NOTE — Patient Instructions (Signed)
#   Claritin one day-

## 2020-07-14 NOTE — Assessment & Plan Note (Addendum)
#  Incidental gallbladder adenocarcinoma- Stage III- T3N1; DEC 2021- ca-19-9- 4322. On NEO-ADJ chemo- Gem-Cis [plan up to 6 cycles of chemotherapy].  CA 19-9 will be monitored closely.  # Proceed with cycle#1; day-8. Labs today reviewed;  acceptable for treatment today. onpro today.  Discussed the possibility of arthralgias with growth after support.  Discussed re: clairitin one day.   # Blood glucose- post BF- 219; not on medications. Recommend checking BG 2-3 times x 2-3 days post chemo; call us if elevated >300 re: starting oral hypoglycemic- like glipizide.   # HTN- elevated; off HCTZ; start metoprolol 50 mg XL once a day; continue with cozaar.    # DISPOSITION: # Chemo today   # follow up X- MD- in 3 weeks; labs- cbc/cmp/mag; Ca-19-9- Gem-Cis-  #  Follow up MD- 4 weeks; labs- cbc/cmp/mag- Gem-Cis-Dr.B

## 2020-07-14 NOTE — Progress Notes (Signed)
[facility] CONSULT NOTE  Patient Care Team: Revelo, Elyse Jarvis, MD as PCP - General (Family Medicine) Rico Junker, RN as Registered Nurse Christene Lye, MD (General Surgery) Clent Jacks, RN as Oncology Nurse Navigator  CHIEF COMPLAINTS/PURPOSE OF CONSULTATION: GALLBLADDER CANCER   #  Oncology History Overview Note  10/25- GALLBLADDER CANCER- [Dr.Cannon]/Dr.Byerly- laproscopic cholecystectomy- pT Category: pT3; pN1' POSITIVE for PNI; Histologic Type: Adenocarcinoma, biliary type  Histologic Grade: G3, poorly differentiated  Tumor Size: Greatest dimension: 2.2 cm  Tumor Extent: Tumor directly invades the liver  Lymphovascular Invasion: Present pre-treatement   Tumor Site: Fundus MARGINS  Margin Status for Invasive Carcinoma:      Margin Involved by Invasive Carcinoma: Liver parenchymal / hepatic  Bed; Number of Lymph Nodes with Tumor: 1       Number of Lymph Nodes Examined: 1    # October 25th 2021- Incidental gallbladder ADENOCA-s/p acute cholecystectomy [Dr.Cannon]. Stage III- T3N1; liver margin positive.  CT chest negative for any metastatic disease.  MRI abdomen with and without contrast-residual disease noted gallbladder bed; no evidence of distant metastases or lymphadenopathy. NOv 3rd 2021-CA 19-9 > 2000;     # NEOADJUVANT CHEMO-12/15-  Gem-cis [d1 & d-8 q 21 days] ; DEC 2021- Ca-19-03-4321.   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[place])   07/07/2020 -  Chemotherapy   The patient had dexamethasone (DECADRON) 4 MG tablet, 8 mg, Oral, Daily, 1 of 1 cycle, Start date: --, End date: -- palonosetron (ALOXI) injection 0.25 mg, 0.25 mg, Intravenous,   Once, 1 of 6 cycles Administration: 0.25 mg (07/07/2020), 0.25 mg (07/14/2020) pegfilgrastim (NEULASTA ONPRO KIT) injection 6 mg, 6 mg, Subcutaneous, Once, 1 of 6 cycles Administration: 6 mg (07/14/2020) CISplatin (PLATINOL) 53 mg in sodium chloride 0.9 % 250 mL chemo infusion, 25 mg/m2 = 53 mg, Intravenous,  Once, 1 of 6 cycles Administration: 53 mg (07/07/2020), 53 mg (07/14/2020) gemcitabine (GEMZAR) 2,000 mg in sodium chloride 0.9 % 250 mL chemo infusion, 2,128 mg, Intravenous,  Once, 1 of 6 cycles Administration: 2,000 mg (07/07/2020), 2,000 mg (07/14/2020) fosaprepitant (EMEND) 150 mg in sodium chloride 0.9 % 145 mL IVPB, 150 mg, Intravenous,  Once, 1 of 6 cycles Administration: 150 mg (07/07/2020), 150 mg (07/14/2020)  for chemotherapy treatment.       HISTORY OF PRESENTING ILLNESS:  Kristina Orozco 64 y.o.  female incidental pathologic gallbladder cancer stage III- currently on neoadjuvant chemotherapy- Gem-Cis is here a follow up.  Patient received cycle number 1 day one 1 week ago.  Patient denies any nausea vomiting.  Denies any tingling numbness.   Complains of postnasal discharge.  Complains of stuffy nose.  Patient has been taking Benadryl.  Review of Systems  Constitutional: Negative for chills, diaphoresis, fever and malaise/fatigue.  HENT: Negative for nosebleeds and sore throat.   Eyes: Negative for double vision.  Respiratory: Negative for cough, hemoptysis, sputum production, shortness of breath and wheezing.   Cardiovascular: Negative for chest pain, palpitations, orthopnea and leg swelling.  Gastrointestinal: Negative for abdominal pain, blood in stool, constipation, diarrhea, heartburn, melena, nausea and vomiting.  Genitourinary: Negative for dysuria, frequency and urgency.  Musculoskeletal: Negative for back  pain and joint pain.  Skin: Negative.  Negative for itching and rash.  Neurological: Negative for dizziness, tingling, focal weakness, weakness and  headaches.  Endo/Heme/Allergies: Does not bruise/bleed easily.  Psychiatric/Behavioral: Negative for depression. The patient is not nervous/anxious and does not have insomnia.      MEDICAL HISTORY:  Past Medical History:  Diagnosis Date  . Cancer ([place]) 05/17/2020   gallbladder  . Diabetes mellitus without complication ([place])   . Hypertension     SURGICAL HISTORY: Past Surgical History:  Procedure Laterality Date  . ABDOMINAL HYSTERECTOMY    . APPENDECTOMY  2004  . BREAST BIOPSY Left 2011  . BREAST BIOPSY Right 08/26/13  . 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/28/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  . CHOLECYSTECTOMY N/A 05/17/2020   Procedure: LAPAROSCOPIC CHOLECYSTECTOMY;  Surgeon: Fredirick Maudlin, MD;  Location: [facility] ORS;  Service: General;  Laterality: N/A;  . COLONOSCOPY WITH PROPOFOL N/A 01/31/2018   Procedure: COLONOSCOPY WITH PROPOFOL;  Surgeon: Jonathon Bellows, MD;  Location: [facility] ENDOSCOPY;  Service: Gastroenterology;  Laterality: N/A;  . PORTACATH PLACEMENT Right 07/02/2020   Procedure: INSERTION PORT-A-CATH, chemotherapy;  Surgeon: Fredirick Maudlin, MD;  Location: [facility] ORS;  Service: General;  Laterality: Right;    SOCIAL HISTORY: Social History   Socioeconomic History  . Marital status: Single    Spouse name: Not on file  . Number of children: Not on file  . Years of education: Not on file  . Highest education level: Not on file  Occupational History  . Occupation: special needs worker  Tobacco Use  . Smoking status: Never Smoker  . Smokeless tobacco: Never Used  Vaping Use  . Vaping Use: Never used  Substance and Sexual Activity  . Alcohol use: Yes    Comment: beer on weekend  . Drug use: No  . Sexual activity: Not on file  Other Topics Concern  . Not on file  Social History Narrative   Lives in [place]; with son. 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 Never smoked; beer/iqour over weekends.    Social Determinants of Health   Financial Resource Strain: Not on  file  Food Insecurity: Not on file  Transportation Needs: Not on file  Physical Activity: Not on file  Stress: Not on file  Social Connections: Not on file  Intimate Partner Violence: Not on file    FAMILY HISTORY: Family History  Problem Relation Age of Onset  . Pancreatic cancer Mother        AT 70 years  . Breast cancer Sister        appx-55 years  . Pancreatic cancer Maternal Grandmother     ALLERGIES:  is allergic to seasonal ic [cholestatin].  MEDICATIONS:  Current Outpatient Medications  Medication Sig Dispense Refill  . acetaminophen (TYLENOL) 500 MG tablet Take 500-1,000 mg by mouth every 6 (six) hours as needed for moderate pain or headache.    . Chlorpheniramine Maleate (ALLERGY RELIEF PO) Take 1 tablet by mouth daily as needed (allergies).    . CINNAMON PO Take 1 tablet by mouth daily.    Marland Kitchen GARLIC PO Take 1 tablet by mouth daily.    Marland Kitchen lidocaine-prilocaine (EMLA) cream Apply 1 application topically as needed. Apply on the port 30-45 mins prior to port access 30 g 0  . losartan (COZAAR) 25 MG tablet Take 25 mg by mouth every morning.     . Multiple Vitamins-Minerals (MULTIVITAMIN WITH MINERALS) tablet Take 1 tablet by mouth daily.    . ondansetron (ZOFRAN) 8 MG tablet  One pill every 8 hours as needed for nausea/vomitting. (Patient taking differently: Take 8 mg by mouth every 8 (eight) hours as needed for nausea or vomiting.) 40 tablet 1  . pravastatin (PRAVACHOL) 10 MG tablet Take 10 mg by mouth at bedtime.    . prochlorperazine (COMPAZINE) 10 MG tablet Take 1 tablet (10 mg total) by mouth every 6 (six) hours as needed for nausea or vomiting. 40 tablet 1  . metoprolol succinate (TOPROL XL) 50 MG 24 hr tablet Take 1 tablet (50 mg total) by mouth daily. Take with or immediately following a meal. 60 tablet 1   No current facility-administered medications for this visit.   Facility-Administered Medications Ordered in Other Visits  Medication Dose Route Frequency Provider  Last Rate Last Admin  . heparin lock flush 100 unit/mL  500 Units Intravenous Once Charlaine Dalton R, MD      . sodium chloride flush (NS) 0.9 % injection 10 mL  10 mL Intravenous PRN Cammie Sickle, MD   10 mL at 07/14/20 0823  . sodium chloride flush (NS) 0.9 % injection 10 mL  10 mL Intracatheter PRN Cammie Sickle, MD          .  PHYSICAL EXAMINATION: ECOG PERFORMANCE STATUS: 0 - Asymptomatic  Vitals:   07/14/20 0852  BP: (!) 157/87  Pulse: (!) 102  Resp: 16  Temp: 98 F (36.7 C)  SpO2: 100%   Filed Weights   07/14/20 0852  Weight: 212 lb 6.4 oz (96.3 kg)    Physical Exam HENT:     Head: Normocephalic and atraumatic.     Mouth/Throat:     Pharynx: No oropharyngeal exudate.  Eyes:     Pupils: Pupils are equal, round, and reactive to light.  Cardiovascular:     Rate and Rhythm: Normal rate and regular rhythm.  Pulmonary:     Effort: Pulmonary effort is normal. No respiratory distress.     Breath sounds: Normal breath sounds. No wheezing.  Abdominal:     General: Bowel sounds are normal. There is no distension.     Palpations: Abdomen is soft. There is no mass.     Tenderness: There is no abdominal tenderness. There is no guarding or rebound.  Musculoskeletal:        General: No tenderness. Normal range of motion.     Cervical back: Normal range of motion and neck supple.  Skin:    General: Skin is warm.  Neurological:     Mental Status: She is alert and oriented to person, place, and time.  Psychiatric:        Mood and Affect: Affect normal.      LABORATORY DATA:  I have reviewed the data as listed Lab Results  Component Value Date   WBC 3.7 (L) 07/14/2020   HGB 11.4 (L) 07/14/2020   HCT 34.9 (L) 07/14/2020   MCV 82.9 07/14/2020   PLT 203 07/14/2020   Recent Labs    06/10/20 1018 07/01/20 0833 07/07/20 0824 07/14/20 0819  NA 136  --  136 137  K 3.2* 3.4* 3.4* 3.7  CL 98  --  96* 100  CO2 27  --  33* 27  GLUCOSE 213*  --   241* 219*  BUN 12  --  11 10  CREATININE 0.64  --  0.52 0.68  CALCIUM 8.9  --  9.1 8.8*  GFRNONAA >60  --  >60 >60  PROT 7.5  --  7.4 7.1  ALBUMIN 3.8  --  3.9 3.8  AST 35  --  32 35  ALT 23  --  22 21  ALKPHOS 62  --  53 50  BILITOT 0.5  --  0.8 0.5    RADIOGRAPHIC STUDIES: I have personally reviewed the radiological images as listed and agreed with the findings in the report. DG CHEST PORT 1 VIEW  Result Date: 07/02/2020 CLINICAL DATA:  Port-A-Cath placement. EXAM: PORTABLE CHEST 1 VIEW COMPARISON:  Chest CT 05/31/2020 FINDINGS: Right IJ Port-A-Cath tip is in the low right atrium. It could be retracted approximately 6 cm. No pneumothorax or pleural effusion. Heart is normal in size. IMPRESSION: Right IJ Port-A-Cath tip is in the low right atrium. Electronically Signed   By: Marijo Sanes M.D.   On: 07/02/2020 12:41   DG C-Arm 1-60 Min-No Report  Result Date: 07/02/2020 Fluoroscopy was utilized by the requesting physician.  No radiographic interpretation.    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 On NEO-ADJ chemo- Gem-Cis [plan up to 6 cycles of chemotherapy].  CA 19-9 will be monitored closely.  # Proceed with cycle#1; day-8. Labs today reviewed;  acceptable for treatment today. onpro today.  Discussed the possibility of arthralgias with growth after support.  Discussed re: clairitin one day.   # Blood glucose- post BF- 219; not on medications. Recommend checking BG 2-3 times x 2-3 days post chemo; call us if elevated >300 re: starting oral hypoglycemic- like glipizide.   # HTN- elevated; off HCTZ; start metoprolol 50 mg XL once a day; continue with cozaar.    # DISPOSITION: # Chemo today   # follow up X- MD- in 3 weeks; labs- cbc/cmp/mag; Ca-19-9- Gem-Cis-  #  Follow up MD- 4 weeks; labs- cbc/cmp/mag- Gem-Cis-Dr.B    All questions were answered. The patient  knows to call the clinic with any problems, questions or concerns.   Cammie Sickle, MD 07/14/2020 7:01 PM

## 2020-07-14 NOTE — Progress Notes (Signed)
HR 102, ANC 1.4.  Ok to proceed per MD

## 2020-07-14 NOTE — Progress Notes (Signed)
Pt tolerated chemo infusions well today with no complaints noted.  Education provided about neulasta onpro and device placed on left abdomen per policy.  Pt left chemo infusion suite stable and ambulatory.

## 2020-07-20 ENCOUNTER — Telehealth: Payer: Self-pay | Admitting: *Deleted

## 2020-07-20 NOTE — Telephone Encounter (Signed)
Spoke with patient. Mychart visit arranged for 1045 am tomorrow. Patient accepted this apt. She is not yet on mychart. I sent her a link to set up her password. She thanked me for calling her back.

## 2020-07-20 NOTE — Telephone Encounter (Signed)
Please schedule her for virtual or in person visit tomorrow with me. Thanks!

## 2020-07-20 NOTE — Telephone Encounter (Signed)
Patient called reporting that the new b/p medicine is too strong and making her head spin. She states that she is not going to take it because of this and the fact that she is already on something for her b/p. Please advise

## 2020-07-21 ENCOUNTER — Inpatient Hospital Stay: Payer: Medicare Other | Admitting: Nurse Practitioner

## 2020-07-25 NOTE — Progress Notes (Signed)
[facility]  [phone] [phone]) [phone]  ID: Kristina Orozco OB: 1956-03-10  MR#: [id number]  ZDG#:[id number]  Patient Care Team: Preston Fleeting, MD as PCP - General (Family Medicine) Jim Like, RN as Registered Nurse Kieth Brightly, MD (General Surgery) Benita Gutter, RN as Oncology Nurse Navigator  CHIEF COMPLAINT: Stage III adneocarcinoma of the gallbladder.  INTERVAL HISTORY: Patient returns to clinic today for further evaluation and consideration of cycle 2, day 1 of split dose cisplatin and gemcitabine.  She is tolerating her treatments well without significant side effects.  Her constipation is better controlled with daily stool softeners.  She also discontinued metoprolol stating it made her feel weak and dizzy.  She currently feels well.  She has no neurologic complaints.  She denies any recent fevers or illnesses.  She has good appetite and denies weight loss.  She has no chest pain, shortness of breath, cough, or hemoptysis.  She denies any nausea, vomiting, constipation, or diarrhea.  She has no urinary complaints.  Patient offers no specific complaints today.  REVIEW OF SYSTEMS:   Review of Systems  Constitutional: Negative.  Negative for fever, malaise/fatigue and weight loss.  Respiratory: Negative.  Negative for cough, hemoptysis and shortness of breath.   Cardiovascular: Negative.  Negative for chest pain and leg swelling.  Gastrointestinal: Negative.  Negative for abdominal pain.  Genitourinary: Negative.  Negative for dysuria.  Musculoskeletal: Negative.  Negative for back pain.  Skin: Negative.  Negative for rash.  Neurological: Negative.  Negative for dizziness, focal weakness, weakness and headaches.  Psychiatric/Behavioral: Negative.  The patient is not nervous/anxious.     As per HPI. Otherwise, a complete review of systems is negative.  PAST MEDICAL HISTORY: Past Medical History:  Diagnosis Date   . Cancer ([facility]) 05/17/2020   gallbladder  . Diabetes mellitus without complication ([facility])   . Hypertension     PAST SURGICAL HISTORY: Past Surgical History:  Procedure Laterality Date  . ABDOMINAL HYSTERECTOMY    . APPENDECTOMY  2004  . BREAST BIOPSY Left 2011  . BREAST BIOPSY Right 08/26/13  . 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/28/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  . CHOLECYSTECTOMY N/A 05/17/2020   Procedure: LAPAROSCOPIC CHOLECYSTECTOMY;  Surgeon: Duanne Guess, MD;  Location: [facility] ORS;  Service: General;  Laterality: N/A;  . COLONOSCOPY WITH PROPOFOL N/A 01/31/2018   Procedure: COLONOSCOPY WITH PROPOFOL;  Surgeon: Wyline Mood, MD;  Location: [facility] ENDOSCOPY;  Service: Gastroenterology;  Laterality: N/A;  . PORTACATH PLACEMENT Right 07/02/2020   Procedure: INSERTION PORT-A-CATH, chemotherapy;  Surgeon: Duanne Guess, MD;  Location: [facility] ORS;  Service: General;  Laterality: Right;    FAMILY HISTORY: Family History  Problem Relation Age of Onset  . Pancreatic cancer Mother        AT 4 years  . Breast cancer Sister        appx-55 years  . Pancreatic cancer Maternal Grandmother     ADVANCED DIRECTIVES (Y/N):  N  HEALTH MAINTENANCE: Social History   Tobacco Use  . Smoking status: Never Smoker  . Smokeless tobacco: Never Used  Vaping Use  . Vaping Use: Never used  Substance Use Topics  . Alcohol use: Yes    Comment: beer on weekend  . Drug use: No     Colonoscopy:  PAP:  Bone density:  Lipid panel:  Allergies  Allergen Reactions  . Seasonal Ic [Cholestatin]     Seasonal allergies causes drainage and stuffy nose    Current Outpatient Medications  Medication Sig Dispense Refill  . acetaminophen (TYLENOL) 500 MG tablet Take 500-1,000 mg by mouth every 6 (six) hours as needed for moderate pain or headache.    . Chlorpheniramine Maleate (ALLERGY RELIEF PO) Take 1 tablet by mouth daily as needed (allergies).    . CINNAMON PO Take 1 tablet by mouth daily.    Marland Kitchen docusate sodium (STOOL  SOFTENER) 100 MG capsule Take 100 mg by mouth daily as needed for mild constipation.    Marland Kitchen GARLIC PO Take 1 tablet by mouth daily.    Marland Kitchen lidocaine-prilocaine (EMLA) cream Apply 1 application topically as needed. Apply on the port 30-45 mins prior to port access 30 g 0  . losartan (COZAAR) 25 MG tablet Take 25 mg by mouth every morning.     . Multiple Vitamins-Minerals (MULTIVITAMIN WITH MINERALS) tablet Take 1 tablet by mouth daily.    . pravastatin (PRAVACHOL) 10 MG tablet Take 10 mg by mouth at bedtime.    . metoprolol succinate (TOPROL XL) 50 MG 24 hr tablet Take 1 tablet (50 mg total) by mouth daily. Take with or immediately following a meal. (Patient not taking: Reported on 07/28/2020) 60 tablet 1  . ondansetron (ZOFRAN) 8 MG tablet One pill every 8 hours as needed for nausea/vomitting. (Patient not taking: Reported on 07/28/2020) 40 tablet 1  . prochlorperazine (COMPAZINE) 10 MG tablet Take 1 tablet (10 mg total) by mouth every 6 (six) hours as needed for nausea or vomiting. (Patient not taking: Reported on 07/28/2020) 40 tablet 1   No current facility-administered medications for this visit.    OBJECTIVE: Vitals:   07/28/20 0858  BP: (!) 145/84  Pulse: 91  Resp: 16  Temp: 98.8 F (37.1 C)     Body mass index is 34.61 kg/m.    ECOG FS:0 - Asymptomatic  General: Well-developed, well-nourished, no acute distress. Eyes: Pink conjunctiva, anicteric sclera. HEENT: Normocephalic, moist mucous membranes. Lungs: No audible wheezing or coughing. Heart: Regular rate and rhythm. Abdomen: Soft, nontender, no obvious distention. Musculoskeletal: No edema, cyanosis, or clubbing. Neuro: Alert, answering all questions appropriately. Cranial nerves grossly intact. Skin: No rashes or petechiae noted. Psych: Normal affect.   LAB RESULTS:  Lab Results  Component Value Date   NA 136 07/28/2020   K 3.5 07/28/2020   CL 100 07/28/2020   CO2 27 07/28/2020   GLUCOSE 183 (H) 07/28/2020   BUN 9  07/28/2020   CREATININE 0.56 07/28/2020   CALCIUM 8.6 (L) 07/28/2020   PROT 6.8 07/28/2020   ALBUMIN 3.9 07/28/2020   AST 22 07/28/2020   ALT 17 07/28/2020   ALKPHOS 69 07/28/2020   BILITOT 0.3 07/28/2020   GFRNONAA >60 07/28/2020    Lab Results  Component Value Date   WBC 16.0 (H) 07/28/2020   NEUTROABS 7.9 (H) 07/28/2020   HGB 11.4 (L) 07/28/2020   HCT 34.9 (L) 07/28/2020   MCV 83.1 07/28/2020   PLT 333 07/28/2020     STUDIES: DG CHEST PORT 1 VIEW  Result Date: 07/02/2020 CLINICAL DATA:  Port-A-Cath placement. EXAM: PORTABLE CHEST 1 VIEW COMPARISON:  Chest CT 05/31/2020 FINDINGS: Right IJ Port-A-Cath tip is in the low right atrium. It could be retracted approximately 6 cm. No pneumothorax or pleural effusion. Heart is normal in size. IMPRESSION: Right IJ Port-A-Cath tip is in the low right atrium. Electronically Signed   By: Marijo Sanes M.D.   On: 07/02/2020 12:41   DG C-Arm 1-60 Min-No Report  Result Date: 07/02/2020 Fluoroscopy was utilized  by the requesting physician.  No radiographic interpretation.    ASSESSMENT: Stage III adneocarcinoma of the gallbladder  PLAN:    1. Stage III adneocarcinoma of the gallbladder: Proceed with cycle 2, day 1 of split dose cisplatin and gemcitabine today.  Patient also requires On-Pro Neulasta on day 8 of each treatment.  Return to clinic in 1 week for further evaluation and consideration of cycle 2, day 8. 2.  Hyperglycemia: Patient's blood glucose remains significantly elevated, but she has improved control. 3.  Anemia: Mild, monitor. 4.  Hypertension: Patient has discontinued metoprolol.  Monitor.  Patient expressed understanding and was in agreement with this plan. She also understands that She can call clinic at any time with any questions, concerns, or complaints.   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

## 2020-07-28 ENCOUNTER — Encounter: Payer: Self-pay | Admitting: Oncology

## 2020-07-28 ENCOUNTER — Inpatient Hospital Stay (HOSPITAL_BASED_OUTPATIENT_CLINIC_OR_DEPARTMENT_OTHER): Payer: Medicare Other | Admitting: Oncology

## 2020-07-28 ENCOUNTER — Inpatient Hospital Stay: Payer: Medicare Other

## 2020-07-28 ENCOUNTER — Inpatient Hospital Stay: Payer: Medicare Other | Attending: Oncology

## 2020-07-28 VITALS — BP 145/84 | HR 91 | Temp 98.8°F | Resp 16 | Ht 66.0 in | Wt 214.4 lb

## 2020-07-28 DIAGNOSIS — C23 Malignant neoplasm of gallbladder: Secondary | ICD-10-CM | POA: Diagnosis not present

## 2020-07-28 DIAGNOSIS — Z79899 Other long term (current) drug therapy: Secondary | ICD-10-CM | POA: Diagnosis not present

## 2020-07-28 DIAGNOSIS — Z5111 Encounter for antineoplastic chemotherapy: Secondary | ICD-10-CM | POA: Diagnosis present

## 2020-07-28 LAB — CBC WITH DIFFERENTIAL/PLATELET
Abs Immature Granulocytes: 0.91 10*3/uL — ABNORMAL HIGH (ref 0.00–0.07)
Basophils Absolute: 0.1 10*3/uL (ref 0.0–0.1)
Basophils Relative: 0 %
Eosinophils Absolute: 0.1 10*3/uL (ref 0.0–0.5)
Eosinophils Relative: 1 %
HCT: 34.9 % — ABNORMAL LOW (ref 36.0–46.0)
Hemoglobin: 11.4 g/dL — ABNORMAL LOW (ref 12.0–15.0)
Immature Granulocytes: 6 %
Lymphocytes Relative: 33 %
Lymphs Abs: 5.3 10*3/uL — ABNORMAL HIGH (ref 0.7–4.0)
MCH: 27.1 pg (ref 26.0–34.0)
MCHC: 32.7 g/dL (ref 30.0–36.0)
MCV: 83.1 fL (ref 80.0–100.0)
Monocytes Absolute: 1.7 10*3/uL — ABNORMAL HIGH (ref 0.1–1.0)
Monocytes Relative: 11 %
Neutro Abs: 7.9 10*3/uL — ABNORMAL HIGH (ref 1.7–7.7)
Neutrophils Relative %: 49 %
Platelets: 333 10*3/uL (ref 150–400)
RBC: 4.2 MIL/uL (ref 3.87–5.11)
RDW: 15.5 % (ref 11.5–15.5)
Smear Review: NORMAL
WBC: 16 10*3/uL — ABNORMAL HIGH (ref 4.0–10.5)
nRBC: 0.2 % (ref 0.0–0.2)

## 2020-07-28 LAB — COMPREHENSIVE METABOLIC PANEL
ALT: 17 U/L (ref 0–44)
AST: 22 U/L (ref 15–41)
Albumin: 3.9 g/dL (ref 3.5–5.0)
Alkaline Phosphatase: 69 U/L (ref 38–126)
Anion gap: 9 (ref 5–15)
BUN: 9 mg/dL (ref 8–23)
CO2: 27 mmol/L (ref 22–32)
Calcium: 8.6 mg/dL — ABNORMAL LOW (ref 8.9–10.3)
Chloride: 100 mmol/L (ref 98–111)
Creatinine, Ser: 0.56 mg/dL (ref 0.44–1.00)
GFR, Estimated: >60 mL/min (ref >60)
Glucose, Bld: 183 mg/dL — ABNORMAL HIGH (ref 70–99)
Potassium: 3.5 mmol/L (ref 3.5–5.1)
Sodium: 136 mmol/L (ref 135–145)
Total Bilirubin: 0.3 mg/dL (ref 0.3–1.2)
Total Protein: 6.8 g/dL (ref 6.5–8.1)

## 2020-07-28 LAB — MAGNESIUM: Magnesium: 1.9 mg/dL (ref 1.7–2.4)

## 2020-07-28 MED ORDER — FOSAPREPITANT DIMEGLUMINE INJECTION 150 MG
150.0000 mg | Freq: Once | INTRAVENOUS | Status: AC
  Administered 2020-07-28: 150 mg via INTRAVENOUS
  Filled 2020-07-28: qty 150

## 2020-07-28 MED ORDER — HEPARIN SOD (PORK) LOCK FLUSH 100 UNIT/ML IV SOLN
500.0000 [IU] | Freq: Once | INTRAVENOUS | Status: AC | PRN
  Administered 2020-07-28: 500 [IU]
  Filled 2020-07-28: qty 5

## 2020-07-28 MED ORDER — SODIUM CHLORIDE 0.9 % IV SOLN
2000.0000 mg | Freq: Once | INTRAVENOUS | Status: AC
  Administered 2020-07-28: 2000 mg via INTRAVENOUS
  Filled 2020-07-28: qty 52.6

## 2020-07-28 MED ORDER — DEXAMETHASONE SODIUM PHOSPHATE 100 MG/10ML IJ SOLN
10.0000 mg | Freq: Once | INTRAMUSCULAR | Status: AC
  Administered 2020-07-28: 10 mg via INTRAVENOUS
  Filled 2020-07-28: qty 1

## 2020-07-28 MED ORDER — SODIUM CHLORIDE 0.9 % IV SOLN
Freq: Once | INTRAVENOUS | Status: AC
  Filled 2020-07-28: qty 250

## 2020-07-28 MED ORDER — POTASSIUM CHLORIDE IN NACL 20-0.9 MEQ/L-% IV SOLN
Freq: Once | INTRAVENOUS | Status: AC
  Filled 2020-07-28: qty 1000

## 2020-07-28 MED ORDER — MAGNESIUM SULFATE 2 GM/50ML IV SOLN
2.0000 g | Freq: Once | INTRAVENOUS | Status: AC
  Administered 2020-07-28: 2 g via INTRAVENOUS
  Filled 2020-07-28: qty 50

## 2020-07-28 MED ORDER — HEPARIN SOD (PORK) LOCK FLUSH 100 UNIT/ML IV SOLN
INTRAVENOUS | Status: AC
  Filled 2020-07-28: qty 5

## 2020-07-28 MED ORDER — PALONOSETRON HCL INJECTION 0.25 MG/5ML
0.2500 mg | Freq: Once | INTRAVENOUS | Status: AC
Start: 2020-07-28 — End: 2020-07-28
  Administered 2020-07-28: 0.25 mg via INTRAVENOUS
  Filled 2020-07-28: qty 5

## 2020-07-28 MED ORDER — SODIUM CHLORIDE 0.9 % IV SOLN
25.0000 mg/m2 | Freq: Once | INTRAVENOUS | Status: AC
  Administered 2020-07-28: 53 mg via INTRAVENOUS
  Filled 2020-07-28: qty 53

## 2020-07-28 NOTE — Progress Notes (Signed)
Pt having constipation with chemo and she is using a stool softner daily around her treatments. She states that metoprolol makes herhead feel weird and can get dizzy like. So he has stopped the med. md notified

## 2020-07-28 NOTE — Progress Notes (Signed)
Pt received cisplatin/gemzar tx in clinic today. Tolerated well.

## 2020-07-29 LAB — CA 19-9 (SERIAL): CA 19-9: 558 U/mL — ABNORMAL HIGH (ref 0–35)

## 2020-08-04 ENCOUNTER — Inpatient Hospital Stay: Payer: Medicare Other

## 2020-08-04 ENCOUNTER — Inpatient Hospital Stay: Payer: Medicare Other | Admitting: Internal Medicine

## 2020-08-04 DIAGNOSIS — C23 Malignant neoplasm of gallbladder: Secondary | ICD-10-CM

## 2020-08-04 DIAGNOSIS — Z5111 Encounter for antineoplastic chemotherapy: Secondary | ICD-10-CM | POA: Diagnosis not present

## 2020-08-04 LAB — CBC WITH DIFFERENTIAL/PLATELET
Abs Immature Granulocytes: 0.05 10*3/uL (ref 0.00–0.07)
Basophils Absolute: 0.2 10*3/uL — ABNORMAL HIGH (ref 0.0–0.1)
Basophils Relative: 2 %
Eosinophils Absolute: 0 10*3/uL (ref 0.0–0.5)
Eosinophils Relative: 0 %
HCT: 34 % — ABNORMAL LOW (ref 36.0–46.0)
Hemoglobin: 11 g/dL — ABNORMAL LOW (ref 12.0–15.0)
Immature Granulocytes: 1 %
Lymphocytes Relative: 62 %
Lymphs Abs: 4.2 10*3/uL — ABNORMAL HIGH (ref 0.7–4.0)
MCH: 26.6 pg (ref 26.0–34.0)
MCHC: 32.4 g/dL (ref 30.0–36.0)
MCV: 82.3 fL (ref 80.0–100.0)
Monocytes Absolute: 0.9 10*3/uL (ref 0.1–1.0)
Monocytes Relative: 13 %
Neutro Abs: 1.5 10*3/uL — ABNORMAL LOW (ref 1.7–7.7)
Neutrophils Relative %: 22 %
Platelets: 488 10*3/uL — ABNORMAL HIGH (ref 150–400)
RBC: 4.13 MIL/uL (ref 3.87–5.11)
RDW: 15.5 % (ref 11.5–15.5)
WBC: 6.7 10*3/uL (ref 4.0–10.5)
nRBC: 0 % (ref 0.0–0.2)

## 2020-08-04 LAB — COMPREHENSIVE METABOLIC PANEL
ALT: 18 U/L (ref 0–44)
AST: 23 U/L (ref 15–41)
Albumin: 4.2 g/dL (ref 3.5–5.0)
Alkaline Phosphatase: 55 U/L (ref 38–126)
Anion gap: 8 (ref 5–15)
BUN: 17 mg/dL (ref 8–23)
CO2: 27 mmol/L (ref 22–32)
Calcium: 9.1 mg/dL (ref 8.9–10.3)
Chloride: 101 mmol/L (ref 98–111)
Creatinine, Ser: 0.59 mg/dL (ref 0.44–1.00)
GFR, Estimated: >60 mL/min (ref >60)
Glucose, Bld: 197 mg/dL — ABNORMAL HIGH (ref 70–99)
Potassium: 3.8 mmol/L (ref 3.5–5.1)
Sodium: 136 mmol/L (ref 135–145)
Total Bilirubin: 0.4 mg/dL (ref 0.3–1.2)
Total Protein: 7.5 g/dL (ref 6.5–8.1)

## 2020-08-04 LAB — MAGNESIUM: Magnesium: 2 mg/dL (ref 1.7–2.4)

## 2020-08-04 MED ORDER — PALONOSETRON HCL INJECTION 0.25 MG/5ML
0.2500 mg | Freq: Once | INTRAVENOUS | Status: AC
  Administered 2020-08-04: 0.25 mg via INTRAVENOUS
  Filled 2020-08-04: qty 5

## 2020-08-04 MED ORDER — SODIUM CHLORIDE 0.9 % IV SOLN
Freq: Once | INTRAVENOUS | Status: AC
  Filled 2020-08-04: qty 250

## 2020-08-04 MED ORDER — HEPARIN SOD (PORK) LOCK FLUSH 100 UNIT/ML IV SOLN
INTRAVENOUS | Status: AC
  Filled 2020-08-04: qty 5

## 2020-08-04 MED ORDER — SODIUM CHLORIDE 0.9 % IV SOLN
150.0000 mg | Freq: Once | INTRAVENOUS | Status: AC
  Administered 2020-08-04: 150 mg via INTRAVENOUS
  Filled 2020-08-04: qty 150

## 2020-08-04 MED ORDER — HEPARIN SOD (PORK) LOCK FLUSH 100 UNIT/ML IV SOLN
500.0000 [IU] | Freq: Once | INTRAVENOUS | Status: DC
  Filled 2020-08-04: qty 5

## 2020-08-04 MED ORDER — POTASSIUM CHLORIDE IN NACL 20-0.9 MEQ/L-% IV SOLN
Freq: Once | INTRAVENOUS | Status: AC
  Filled 2020-08-04: qty 1000

## 2020-08-04 MED ORDER — GEMCITABINE HCL CHEMO INJECTION 1 GM/26.3ML
2000.0000 mg | Freq: Once | INTRAVENOUS | Status: AC
  Administered 2020-08-04: 2000 mg via INTRAVENOUS
  Filled 2020-08-04: qty 52.6

## 2020-08-04 MED ORDER — PEGFILGRASTIM 6 MG/0.6ML ~~LOC~~ PSKT
6.0000 mg | PREFILLED_SYRINGE | Freq: Once | SUBCUTANEOUS | Status: AC
  Administered 2020-08-04: 6 mg via SUBCUTANEOUS
  Filled 2020-08-04: qty 0.6

## 2020-08-04 MED ORDER — HEPARIN SOD (PORK) LOCK FLUSH 100 UNIT/ML IV SOLN
500.0000 [IU] | Freq: Once | INTRAVENOUS | Status: AC | PRN
  Administered 2020-08-04: 500 [IU]
  Filled 2020-08-04: qty 5

## 2020-08-04 MED ORDER — SODIUM CHLORIDE 0.9 % IV SOLN
25.0000 mg/m2 | Freq: Once | INTRAVENOUS | Status: AC
  Administered 2020-08-04: 53 mg via INTRAVENOUS
  Filled 2020-08-04: qty 53

## 2020-08-04 MED ORDER — SODIUM CHLORIDE 0.9 % IV SOLN: Freq: Once | INTRAVENOUS | Status: DC

## 2020-08-04 MED ORDER — SODIUM CHLORIDE 0.9% FLUSH
10.0000 mL | INTRAVENOUS | Status: DC | PRN
  Filled 2020-08-04: qty 10

## 2020-08-04 MED ORDER — SODIUM CHLORIDE 0.9% FLUSH
10.0000 mL | INTRAVENOUS | Status: DC | PRN
  Administered 2020-08-04: 10 mL via INTRAVENOUS
  Filled 2020-08-04: qty 10

## 2020-08-04 MED ORDER — MAGNESIUM SULFATE 2 GM/50ML IV SOLN
2.0000 g | Freq: Once | INTRAVENOUS | Status: AC
  Administered 2020-08-04: 2 g via INTRAVENOUS
  Filled 2020-08-04: qty 50

## 2020-08-04 MED ORDER — SODIUM CHLORIDE 0.9 % IV SOLN
10.0000 mg | Freq: Once | INTRAVENOUS | Status: AC
  Administered 2020-08-04: 10 mg via INTRAVENOUS
  Filled 2020-08-04: qty 10

## 2020-08-04 NOTE — Progress Notes (Signed)
Pt tolerated all infusions well today.  Neulasta on pro placed to left abdomen, pt verbalized understanding of how the device will work.  Pt left chemo suite stable and ambulatory.

## 2020-08-04 NOTE — Progress Notes (Signed)
HR 101 ok to treat per MD

## 2020-08-04 NOTE — Assessment & Plan Note (Addendum)
#  Incidental gallbladder adenocarcinoma- Stage III- T3N1; DEC 2021- ca-19-9- 4322. On NEO-ADJ chemo- Gem-Cis [plan up to 6 cycles of chemotherapy]. STABLE.   # Proceed with cycle#2; day-8. Labs today reviewed;  acceptable for treatment today. onpro today.    # Constipation: recommend miralax BID; increased fluid intake.   # Blood glucose- post BF- 197;  not on medications; FBS- 135. Monitor closely.   # HTN- elevated; STOPPED metoprolol 50 mg XL;  continue with cozaar.    # DISPOSITION: # Chemo today   # follow up  MD- in 2 weeks; labs- cbc/cmp/mag; Ca-19-9- Gem-Cis-  #  Follow up MD- 3 weeks; labs- cbc/cmp/mag- Gem-Cis-Dr.B

## 2020-08-04 NOTE — Progress Notes (Signed)
[place] CONSULT NOTE  Patient Care Team: Revelo, Elyse Jarvis, MD as PCP - General (Family Medicine) Rico Junker, RN as Registered Nurse Christene Lye, MD (General Surgery) Clent Jacks, RN as Oncology Nurse Navigator  CHIEF COMPLAINTS/PURPOSE OF CONSULTATION: GALLBLADDER CANCER   #  Oncology History Overview Note  10/25- GALLBLADDER CANCER- [Dr.Cannon]/Dr.Byerly- laproscopic cholecystectomy- pT Category: pT3; pN1' POSITIVE for PNI; Histologic Type: Adenocarcinoma, biliary type  Histologic Grade: G3, poorly differentiated  Tumor Size: Greatest dimension: 2.2 cm  Tumor Extent: Tumor directly invades the liver  Lymphovascular Invasion: Present pre-treatement   Tumor Site: Fundus MARGINS  Margin Status for Invasive Carcinoma:      Margin Involved by Invasive Carcinoma: Liver parenchymal / hepatic  Bed; Number of Lymph Nodes with Tumor: 1       Number of Lymph Nodes Examined: 1    # October 25th 2021- Incidental gallbladder ADENOCA-s/p acute cholecystectomy [Dr.Cannon]. Stage III- T3N1; liver margin positive.  CT chest negative for any metastatic disease.  MRI abdomen with and without contrast-residual disease noted gallbladder bed; no evidence of distant metastases or lymphadenopathy. NOv 3rd 2021-CA 19-9 > 2000;     # NEOADJUVANT CHEMO-12/15-  Gem-cis [d1 & d-8 q 21 days] ; DEC 2021- Ca-19-03-4321.   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  female incidental pathologic  gallbladder cancer stage III- currently on neoadjuvant chemotherapy- Gem-Cis is here a follow up.  Patient complains of constipation on chemotherapy.  Patient has been taking Dulcolax.   No nausea no vomiting.  No tingling or numbness.  Review of Systems  Constitutional: Negative for chills, diaphoresis, fever and malaise/fatigue.  HENT: Negative for nosebleeds and sore throat.   Eyes: Negative for double vision.  Respiratory: Negative for cough, hemoptysis, sputum production, shortness of breath and wheezing.   Cardiovascular: Negative for chest pain, palpitations, orthopnea and leg swelling.  Gastrointestinal: Positive for constipation. Negative for abdominal pain, blood in stool, diarrhea, heartburn, melena, nausea and vomiting.  Genitourinary: Negative for dysuria, frequency and urgency.  Musculoskeletal: Negative for back pain and joint pain.  Skin: Negative.  Negative for itching and rash.  Neurological: Negative for dizziness, tingling, focal weakness, weakness and headaches.  Endo/Heme/Allergies: Does not bruise/bleed easily.  Psychiatric/Behavioral: Negative for depression. The patient is not nervous/anxious and does not have insomnia.      MEDICAL HISTORY:  Past Medical History:  Diagnosis Date  . Cancer ([place]) 05/17/2020   gallbladder  . Diabetes mellitus without complication ([place])   . Hypertension     SURGICAL HISTORY: Past Surgical History:  Procedure Laterality Date  . ABDOMINAL HYSTERECTOMY    . APPENDECTOMY  2004  . BREAST BIOPSY Left 2011  . BREAST BIOPSY Right 08/26/13  . 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/28/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  . CHOLECYSTECTOMY N/A 05/17/2020   Procedure: LAPAROSCOPIC CHOLECYSTECTOMY;  Surgeon: Fredirick Maudlin, MD;  Location: [facility] ORS;  Service: General;  Laterality: N/A;  . COLONOSCOPY WITH  PROPOFOL N/A 01/31/2018   Procedure: COLONOSCOPY WITH PROPOFOL;  Surgeon: Jonathon Bellows, MD;  Location: [facility] ENDOSCOPY;  Service: Gastroenterology;  Laterality: N/A;   . PORTACATH PLACEMENT Right 07/02/2020   Procedure: INSERTION PORT-A-CATH, chemotherapy;  Surgeon: Fredirick Maudlin, MD;  Location: [facility] ORS;  Service: General;  Laterality: Right;    SOCIAL HISTORY: Social History   Socioeconomic History  . Marital status: Single    Spouse name: Not on file  . Number of children: Not on file  . Years of education: Not on file  . Highest education level: Not on file  Occupational History  . Occupation: special needs worker  Tobacco Use  . Smoking status: Never Smoker  . Smokeless tobacco: Never Used  Vaping Use  . Vaping Use: Never used  Substance and Sexual Activity  . Alcohol use: Yes    Comment: beer on weekend  . Drug use: No  . Sexual activity: Not Currently  Other Topics Concern  . Not on file  Social History Narrative   Lives in [place]; with son. 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 Never smoked; beer/iqour over weekends.    Social Determinants of Health   Financial Resource Strain: Not on file  Food Insecurity: Not on file  Transportation Needs: Not on file  Physical Activity: Not on file  Stress: Not on file  Social Connections: Not on file  Intimate Partner Violence: Not on file    FAMILY HISTORY: Family History  Problem Relation Age of Onset  . Pancreatic cancer Mother        AT 35 years  . Breast cancer Sister        appx-55 years  . Pancreatic cancer Maternal Grandmother     ALLERGIES:  is allergic to seasonal ic [cholestatin].  MEDICATIONS:  Current Outpatient Medications  Medication Sig Dispense Refill  . acetaminophen (TYLENOL) 500 MG tablet Take 500-1,000 mg by mouth every 6 (six) hours as needed for moderate pain or headache.    . Chlorpheniramine Maleate (ALLERGY RELIEF PO) Take 1 tablet by mouth daily as needed (allergies).    . CINNAMON PO Take 1 tablet by mouth daily.    Marland Kitchen docusate sodium (COLACE) 100 MG capsule Take 100 mg by mouth daily as needed for mild constipation.     Marland Kitchen GARLIC PO Take 1 tablet by mouth daily.    Marland Kitchen lidocaine-prilocaine (EMLA) cream Apply 1 application topically as needed. Apply on the port 30-45 mins prior to port access 30 g 0  . losartan (COZAAR) 25 MG tablet Take 25 mg by mouth every morning.     . Multiple Vitamins-Minerals (MULTIVITAMIN WITH MINERALS) tablet Take 1 tablet by mouth daily.    . ondansetron (ZOFRAN) 8 MG tablet One pill every 8 hours as needed for nausea/vomitting. 40 tablet 1  . pravastatin (PRAVACHOL) 10 MG tablet Take 10 mg by mouth at bedtime.    . prochlorperazine (COMPAZINE) 10 MG tablet Take 1 tablet (10 mg total) by mouth every 6 (six) hours as needed for nausea or vomiting. 40 tablet 1  . metoprolol succinate (TOPROL XL) 50 MG 24 hr tablet Take 1 tablet (50 mg total) by mouth daily. Take with or immediately following a meal. (Patient not taking: No sig reported) 60 tablet 1   No current facility-administered medications for this visit.      Marland Kitchen  PHYSICAL EXAMINATION: ECOG PERFORMANCE STATUS: 0 - Asymptomatic  Vitals:   08/04/20 0853  BP: 134/78  Pulse: (!) 101  Resp: 17  Temp: 98.9 F (37.2 C)  SpO2: 100%   Filed Weights   08/04/20 0853  Weight: 209 lb (94.8 kg)    Physical Exam HENT:     Head: Normocephalic and atraumatic.     Mouth/Throat:     Pharynx: No oropharyngeal exudate.  Eyes:     Pupils: Pupils are equal, round, and reactive to light.  Cardiovascular:     Rate and Rhythm: Normal rate and regular rhythm.  Pulmonary:     Effort: Pulmonary effort is normal. No respiratory distress.     Breath sounds: Normal breath sounds. No wheezing.  Abdominal:     General: Bowel sounds are normal. There is no distension.     Palpations: Abdomen is soft. There is no mass.     Tenderness: There is no abdominal tenderness. There is no guarding or rebound.  Musculoskeletal:        General: No tenderness. Normal range of motion.     Cervical back: Normal range of motion and neck supple.  Skin:     General: Skin is warm.  Neurological:     Mental Status: She is alert and oriented to person, place, and time.  Psychiatric:        Mood and Affect: Affect normal.      LABORATORY DATA:  I have reviewed the data as listed Lab Results  Component Value Date   WBC 6.7 08/04/2020   HGB 11.0 (L) 08/04/2020   HCT 34.0 (L) 08/04/2020   MCV 82.3 08/04/2020   PLT 488 (H) 08/04/2020   Recent Labs    07/14/20 0819 07/28/20 0823 08/04/20 0835  NA 137 136 136  K 3.7 3.5 3.8  CL 100 100 101  CO2 _0 GLUCOSE 219* 183* 197*  BUN _1 CREATININE 0.68 0.56 0.59  CALCIUM 8.8* 8.6* 9.1  GFRNONAA >60 >60 >60  PROT 7.1 6.8 7.5  ALBUMIN 3.8 3.9 4.2  AST 35 22 23  ALT _2 ALKPHOS 50 69 55  BILITOT 0.5 0.3 0.4    RADIOGRAPHIC STUDIES: I have personally reviewed the radiological images as listed and agreed with the findings in the report. No results found.  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 On NEO-ADJ chemo- Gem-Cis [plan up to 6 cycles of chemotherapy]. STABLE.   # Proceed with cycle#2; day-8. Labs today reviewed;  acceptable for treatment today. onpro today.    # Constipation: recommend miralax BID; increased fluid intake.   # Blood glucose- post BF- 197;  not on medications; FBS- 135. Monitor closely.   # HTN- elevated; STOPPED metoprolol 50 mg XL;  continue with cozaar.    # DISPOSITION: # Chemo today   # follow up  MD- in 2 weeks; labs- cbc/cmp/mag; Ca-19-9- Gem-Cis-  #  Follow up MD- 3 weeks; labs- cbc/cmp/mag- Gem-Cis-Dr.B    All questions were answered. The patient knows to call the clinic with any problems, questions or concerns.   Cammie Sickle, MD 08/05/2020 7:48 AM

## 2020-08-04 NOTE — Patient Instructions (Signed)
#   take miralax twice a day.

## 2020-08-09 ENCOUNTER — Other Ambulatory Visit (HOSPITAL_COMMUNITY): Payer: Medicare Other

## 2020-08-12 ENCOUNTER — Inpatient Hospital Stay: Admission: RE | Admit: 2020-08-12 | Payer: Medicare Other | Source: Home / Self Care | Admitting: General Surgery

## 2020-08-12 ENCOUNTER — Encounter: Admission: RE | Payer: Self-pay | Source: Home / Self Care

## 2020-08-12 SURGERY — LAPAROSCOPY, DIAGNOSTIC
Anesthesia: General

## 2020-08-17 ENCOUNTER — Other Ambulatory Visit: Payer: Self-pay

## 2020-08-17 ENCOUNTER — Telehealth: Payer: Self-pay | Admitting: *Deleted

## 2020-08-17 DIAGNOSIS — C23 Malignant neoplasm of gallbladder: Secondary | ICD-10-CM

## 2020-08-17 DIAGNOSIS — Z95828 Presence of other vascular implants and grafts: Secondary | ICD-10-CM

## 2020-08-17 MED ORDER — LIDOCAINE-PRILOCAINE 2.5-2.5 % EX CREA: 1.0000 "application " | TOPICAL_CREAM | CUTANEOUS | 1 refills | Status: AC | PRN

## 2020-08-17 NOTE — Telephone Encounter (Signed)
Spoke with Elease Etienne, who approved the emla cream prescription to be sent to [facility] under the [employer] fund.  She will bring her in her "light bill" for Barnabas Lister to review.

## 2020-08-17 NOTE — Progress Notes (Signed)
Patient requested to speak to [facility] in social work. She needs assistance with a paying for a light bill and financial assistance paying for emla cream. msg sent to [facility].

## 2020-08-18 ENCOUNTER — Inpatient Hospital Stay: Payer: Medicare Other

## 2020-08-18 ENCOUNTER — Inpatient Hospital Stay (HOSPITAL_BASED_OUTPATIENT_CLINIC_OR_DEPARTMENT_OTHER): Payer: Medicare Other | Admitting: Internal Medicine

## 2020-08-18 VITALS — BP 144/79 | HR 91 | Temp 99.0°F | Wt 213.8 lb

## 2020-08-18 DIAGNOSIS — Z5111 Encounter for antineoplastic chemotherapy: Secondary | ICD-10-CM | POA: Diagnosis not present

## 2020-08-18 DIAGNOSIS — C23 Malignant neoplasm of gallbladder: Secondary | ICD-10-CM

## 2020-08-18 LAB — CBC WITH DIFFERENTIAL/PLATELET
Abs Immature Granulocytes: 0.34 10*3/uL — ABNORMAL HIGH (ref 0.00–0.07)
Basophils Absolute: 0.1 10*3/uL (ref 0.0–0.1)
Basophils Relative: 1 %
Eosinophils Absolute: 0.3 10*3/uL (ref 0.0–0.5)
Eosinophils Relative: 2 %
HCT: 32.1 % — ABNORMAL LOW (ref 36.0–46.0)
Hemoglobin: 10.6 g/dL — ABNORMAL LOW (ref 12.0–15.0)
Immature Granulocytes: 3 %
Lymphocytes Relative: 38 %
Lymphs Abs: 4.2 10*3/uL — ABNORMAL HIGH (ref 0.7–4.0)
MCH: 27.5 pg (ref 26.0–34.0)
MCHC: 33 g/dL (ref 30.0–36.0)
MCV: 83.4 fL (ref 80.0–100.0)
Monocytes Absolute: 1.3 10*3/uL — ABNORMAL HIGH (ref 0.1–1.0)
Monocytes Relative: 12 %
Neutro Abs: 5 10*3/uL (ref 1.7–7.7)
Neutrophils Relative %: 44 %
Platelets: 199 10*3/uL (ref 150–400)
RBC: 3.85 MIL/uL — ABNORMAL LOW (ref 3.87–5.11)
RDW: 18.6 % — ABNORMAL HIGH (ref 11.5–15.5)
WBC: 11.2 10*3/uL — ABNORMAL HIGH (ref 4.0–10.5)
nRBC: 0 % (ref 0.0–0.2)

## 2020-08-18 LAB — COMPREHENSIVE METABOLIC PANEL
ALT: 15 U/L (ref 0–44)
AST: 19 U/L (ref 15–41)
Albumin: 3.8 g/dL (ref 3.5–5.0)
Alkaline Phosphatase: 75 U/L (ref 38–126)
Anion gap: 5 (ref 5–15)
BUN: 14 mg/dL (ref 8–23)
CO2: 29 mmol/L (ref 22–32)
Calcium: 8.8 mg/dL — ABNORMAL LOW (ref 8.9–10.3)
Chloride: 104 mmol/L (ref 98–111)
Creatinine, Ser: 0.7 mg/dL (ref 0.44–1.00)
GFR, Estimated: >60 mL/min (ref >60)
Glucose, Bld: 169 mg/dL — ABNORMAL HIGH (ref 70–99)
Potassium: 3.7 mmol/L (ref 3.5–5.1)
Sodium: 138 mmol/L (ref 135–145)
Total Bilirubin: 0.5 mg/dL (ref 0.3–1.2)
Total Protein: 6.9 g/dL (ref 6.5–8.1)

## 2020-08-18 LAB — MAGNESIUM: Magnesium: 1.9 mg/dL (ref 1.7–2.4)

## 2020-08-18 MED ORDER — PALONOSETRON HCL INJECTION 0.25 MG/5ML
0.2500 mg | Freq: Once | INTRAVENOUS | Status: AC
  Administered 2020-08-18: 0.25 mg via INTRAVENOUS
  Filled 2020-08-18: qty 5

## 2020-08-18 MED ORDER — SODIUM CHLORIDE 0.9 % IV SOLN
Freq: Once | INTRAVENOUS | Status: AC
  Filled 2020-08-18: qty 250

## 2020-08-18 MED ORDER — SODIUM CHLORIDE 0.9% FLUSH
10.0000 mL | INTRAVENOUS | Status: DC | PRN
Start: 2020-08-18 — End: 2020-08-18
  Administered 2020-08-18 (×2): 10 mL
  Filled 2020-08-18: qty 10

## 2020-08-18 MED ORDER — SODIUM CHLORIDE 0.9 % IV SOLN
150.0000 mg | Freq: Once | INTRAVENOUS | Status: DC
  Filled 2020-08-18: qty 5

## 2020-08-18 MED ORDER — HEPARIN SOD (PORK) LOCK FLUSH 100 UNIT/ML IV SOLN
INTRAVENOUS | Status: AC
  Filled 2020-08-18: qty 5

## 2020-08-18 MED ORDER — SODIUM CHLORIDE 0.9 % IV SOLN
2000.0000 mg | Freq: Once | INTRAVENOUS | Status: AC
  Administered 2020-08-18: 2000 mg via INTRAVENOUS
  Filled 2020-08-18: qty 52.6

## 2020-08-18 MED ORDER — HEPARIN SOD (PORK) LOCK FLUSH 100 UNIT/ML IV SOLN
500.0000 [IU] | Freq: Once | INTRAVENOUS | Status: AC | PRN
  Administered 2020-08-18: 500 [IU]
  Filled 2020-08-18: qty 5

## 2020-08-18 MED ORDER — SODIUM CHLORIDE 0.9 % IV SOLN
10.0000 mg | Freq: Once | INTRAVENOUS | Status: AC
  Administered 2020-08-18: 10 mg via INTRAVENOUS
  Filled 2020-08-18: qty 10

## 2020-08-18 MED ORDER — POTASSIUM CHLORIDE IN NACL 20-0.9 MEQ/L-% IV SOLN
Freq: Once | INTRAVENOUS | Status: AC
  Filled 2020-08-18: qty 1000

## 2020-08-18 MED ORDER — SODIUM CHLORIDE 0.9 % IV SOLN
25.0000 mg/m2 | Freq: Once | INTRAVENOUS | Status: AC
  Administered 2020-08-18: 53 mg via INTRAVENOUS
  Filled 2020-08-18: qty 53

## 2020-08-18 NOTE — Assessment & Plan Note (Addendum)
#  Incidental gallbladder adenocarcinoma- Stage III- T3N1; DEC 2021- ca-19-9- 4322. On NEO-ADJ chemo- Gem-Cis STABLE.   # Proceed with cycle#3; day-1. Labs today reviewed;  acceptable for treatment today.  Discussed that we will plan total of 6 cycles of neoadjuvant chemotherapy.  Discussed that we will need to monitor tumor marker closely.  # Constipation: STABLE;  miralax BID; increased fluid intake.   # Blood glucose- post BF- 169;  not on medications; Marland Kitchen Monitor closely.   # HTN- elevated; STOPPED metoprolol 50 mg XL;  continue with cozaar.    # DISPOSITION: # Chemo today   # follow up  1 weeks; labs- cbc/cmp/mag;-chemo Gem-Cis; onpro-  #  Follow up MD- 3 weeks; labs- cbc/cmp/mag; ca-19-9- Gem-Cis-Dr.B

## 2020-08-18 NOTE — Progress Notes (Signed)
[place] CONSULT NOTE  Patient Care Team: Revelo, Elyse Jarvis, MD as PCP - General (Family Medicine) Rico Junker, RN as Registered Nurse Christene Lye, MD (General Surgery) Clent Jacks, RN as Oncology Nurse Navigator  CHIEF COMPLAINTS/PURPOSE OF CONSULTATION: GALLBLADDER CANCER   #  Oncology History Overview Note  10/25- GALLBLADDER CANCER- [Dr.Cannon]/Dr.Byerly- laproscopic cholecystectomy- pT Category: pT3; pN1' POSITIVE for PNI; Histologic Type: Adenocarcinoma, biliary type  Histologic Grade: G3, poorly differentiated  Tumor Size: Greatest dimension: 2.2 cm  Tumor Extent: Tumor directly invades the liver  Lymphovascular Invasion: Present pre-treatement   Tumor Site: Fundus MARGINS  Margin Status for Invasive Carcinoma:      Margin Involved by Invasive Carcinoma: Liver parenchymal / hepatic  Bed; Number of Lymph Nodes with Tumor: 1       Number of Lymph Nodes Examined: 1    # October 25th 2021- Incidental gallbladder ADENOCA-s/p acute cholecystectomy [Dr.Cannon]. Stage III- T3N1; liver margin positive.  CT chest negative for any metastatic disease.  MRI abdomen with and without contrast-residual disease noted gallbladder bed; no evidence of distant metastases or lymphadenopathy. NOv 3rd 2021-CA 19-9 > 2000;     # NEOADJUVANT CHEMO-12/15-  Gem-cis [d1 & d-8 q 21 days] ; DEC 2021- Ca-19-03-4321.   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  female incidental pathologic gallbladder cancer stage III- currently on neoadjuvant chemotherapy- Gem-Cis is here a follow up.  Patient states that she is taking the MiraLAX constipation is improved.  Denies any diarrhea.  Denies abdominal pain.  No tingling or numbness.  No hearing difficulty.  No nausea no vomiting.   Review of Systems  Constitutional: Negative for chills, diaphoresis, fever and malaise/fatigue.  HENT: Negative for nosebleeds and sore throat.   Eyes: Negative for double vision.  Respiratory: Negative for cough, hemoptysis, sputum production, shortness of breath and wheezing.   Cardiovascular: Negative for chest pain, palpitations, orthopnea and leg swelling.  Gastrointestinal: Positive for constipation. Negative for abdominal pain, blood in stool, diarrhea, heartburn, melena, nausea and vomiting.  Genitourinary: Negative for dysuria, frequency and urgency.  Musculoskeletal: Negative for back pain and joint pain.  Skin: Negative.  Negative for itching and rash.  Neurological: Negative for dizziness, tingling, focal weakness, weakness and headaches.  Endo/Heme/Allergies: Does not bruise/bleed easily.  Psychiatric/Behavioral: Negative for depression. The patient is not nervous/anxious and does not have insomnia.      MEDICAL HISTORY:  Past Medical History:  Diagnosis Date  . Cancer ([place]) 05/17/2020   gallbladder  . Diabetes mellitus without complication ([place])   . Hypertension     SURGICAL HISTORY: Past Surgical History:  Procedure Laterality Date  . ABDOMINAL HYSTERECTOMY    . APPENDECTOMY  2004  . BREAST BIOPSY Left 2011  . BREAST BIOPSY Right  08/26/13  . 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/28/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  . CHOLECYSTECTOMY N/A 05/17/2020   Procedure: LAPAROSCOPIC CHOLECYSTECTOMY;  Surgeon: Fredirick Maudlin, MD;  Location: [facility] ORS;   Service: General;  Laterality: N/A;  . COLONOSCOPY WITH PROPOFOL N/A 01/31/2018   Procedure: COLONOSCOPY WITH PROPOFOL;  Surgeon: Jonathon Bellows, MD;  Location: [facility] ENDOSCOPY;  Service: Gastroenterology;  Laterality: N/A;  . PORTACATH PLACEMENT Right 07/02/2020   Procedure: INSERTION PORT-A-CATH, chemotherapy;  Surgeon: Fredirick Maudlin, MD;  Location: [facility] ORS;  Service: General;  Laterality: Right;    SOCIAL HISTORY: Social History   Socioeconomic History  . Marital status: Single    Spouse name: Not on file  . Number of children: Not on file  . Years of education: Not on file  . Highest education level: Not on file  Occupational History  . Occupation: special needs worker  Tobacco Use  . Smoking status: Never Smoker  . Smokeless tobacco: Never Used  Vaping Use  . Vaping Use: Never used  Substance and Sexual Activity  . Alcohol use: Yes    Comment: beer on weekend  . Drug use: No  . Sexual activity: Not Currently  Other Topics Concern  . Not on file  Social History Narrative   Lives in [place]; with son. 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 Never smoked; beer/iqour over weekends.    Social Determinants of Health   Financial Resource Strain: Not on file  Food Insecurity: Not on file  Transportation Needs: Not on file  Physical Activity: Not on file  Stress: Not on file  Social Connections: Not on file  Intimate Partner Violence: Not on file    FAMILY HISTORY: Family History  Problem Relation Age of Onset  . Pancreatic cancer Mother        AT 9 years  . Breast cancer Sister        appx-55 years  . Pancreatic cancer Maternal Grandmother     ALLERGIES:  is allergic to seasonal ic [cholestatin].  MEDICATIONS:  Current Outpatient Medications  Medication Sig Dispense Refill  . acetaminophen (TYLENOL) 500 MG tablet Take 500-1,000 mg by mouth every 6 (six) hours as needed for moderate pain or headache.    . Chlorpheniramine Maleate (ALLERGY  RELIEF PO) Take 1 tablet by mouth daily as needed (allergies).    . CINNAMON PO Take 1 tablet by mouth daily.    Marland Kitchen docusate sodium (COLACE) 100 MG capsule Take 100 mg by mouth daily as needed for mild constipation.    Marland Kitchen GARLIC PO Take 1 tablet by mouth daily.    Marland Kitchen losartan (COZAAR) 25 MG tablet Take 25 mg by mouth every morning.     . Multiple Vitamins-Minerals (MULTIVITAMIN WITH MINERALS) tablet Take 1 tablet by mouth daily.    . polyethylene glycol (MIRALAX / GLYCOLAX) 17 g packet Take 17 g by mouth daily.    . pravastatin (PRAVACHOL) 10 MG tablet Take 10 mg by mouth at bedtime.    . lidocaine-prilocaine (EMLA) cream Apply 1 application topically as needed. Apply on the port 30-45 mins prior to port access 30 g 1  . ondansetron (ZOFRAN) 8 MG tablet One pill every 8 hours as needed for nausea/vomitting. (Patient not taking: Reported on 08/17/2020) 40 tablet 1  . prochlorperazine (COMPAZINE) 10 MG tablet Take 1 tablet (10 mg total) by mouth every 6 (six) hours as needed for nausea or vomiting. (Patient not taking: Reported on 08/17/2020) 40 tablet 1   No current facility-administered medications for  this visit.   Facility-Administered Medications Ordered in Other Visits  Medication Dose Route Frequency Provider Last Rate Last Admin  . CISplatin (PLATINOL) 53 mg in sodium chloride 0.9 % 250 mL chemo infusion  25 mg/m2 (Treatment Plan Recorded) Intravenous Once Charlaine Dalton R, MD      . gemcitabine (GEMZAR) 2,000 mg in sodium chloride 0.9 % 250 mL chemo infusion  2,000 mg Intravenous Once Charlaine Dalton R, MD      . heparin lock flush 100 unit/mL  500 Units Intracatheter Once PRN Charlaine Dalton R, MD      . sodium chloride flush (NS) 0.9 % injection 10 mL  10 mL Intracatheter PRN Cammie Sickle, MD   10 mL at 08/18/20 0950      .  PHYSICAL EXAMINATION: ECOG PERFORMANCE STATUS: 0 - Asymptomatic  Vitals:   08/18/20 0856  BP: (!) 144/79  Pulse: 91  Temp: 99 F (37.2  C)  SpO2: 100%   Filed Weights   08/18/20 0856  Weight: 213 lb 12.8 oz (97 kg)    Physical Exam HENT:     Head: Normocephalic and atraumatic.     Mouth/Throat:     Pharynx: No oropharyngeal exudate.  Eyes:     Pupils: Pupils are equal, round, and reactive to light.  Cardiovascular:     Rate and Rhythm: Normal rate and regular rhythm.  Pulmonary:     Effort: Pulmonary effort is normal. No respiratory distress.     Breath sounds: Normal breath sounds. No wheezing.  Abdominal:     General: Bowel sounds are normal. There is no distension.     Palpations: Abdomen is soft. There is no mass.     Tenderness: There is no abdominal tenderness. There is no guarding or rebound.  Musculoskeletal:        General: No tenderness. Normal range of motion.     Cervical back: Normal range of motion and neck supple.  Skin:    General: Skin is warm.  Neurological:     Mental Status: She is alert and oriented to person, place, and time.  Psychiatric:        Mood and Affect: Affect normal.      LABORATORY DATA:  I have reviewed the data as listed Lab Results  Component Value Date   WBC 11.2 (H) 08/18/2020   HGB 10.6 (L) 08/18/2020   HCT 32.1 (L) 08/18/2020   MCV 83.4 08/18/2020   PLT 199 08/18/2020   Recent Labs    07/28/20 0823 08/04/20 0835 08/18/20 0836  NA 136 136 138  K 3.5 3.8 3.7  CL 100 101 104  CO2 '27 27 29  ' GLUCOSE 183* 197* 169*  BUN '9 17 14  ' CREATININE 0.56 0.59 0.70  CALCIUM 8.6* 9.1 8.8*  GFRNONAA >60 >60 >60  PROT 6.8 7.5 6.9  ALBUMIN 3.9 4.2 3.8  AST '22 23 19  ' ALT '17 18 15  ' ALKPHOS 69 55 75  BILITOT 0.3 0.4 0.5    RADIOGRAPHIC STUDIES: I have personally reviewed the radiological images as listed and agreed with the findings in the report. No results found.  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 On NEO-ADJ chemo- Gem-Cis STABLE.   #  Proceed with cycle#3; day-1. Labs today reviewed;  acceptable for treatment today.  Discussed that we will plan total of 6 cycles of neoadjuvant chemotherapy.  Discussed that we will need to  monitor tumor marker closely.  # Constipation: STABLE;  miralax BID; increased fluid intake.   # Blood glucose- post BF- 169;  not on medications; Marland Kitchen Monitor closely.   # HTN- elevated; STOPPED metoprolol 50 mg XL;  continue with cozaar.    # DISPOSITION: # Chemo today   # follow up  1 weeks; labs- cbc/cmp/mag;-chemo Gem-Cis; onpro-  #  Follow up MD- 3 weeks; labs- cbc/cmp/mag; ca-19-9- Gem-Cis-Dr.B    All questions were answered. The patient knows to call the clinic with any problems, questions or concerns.   Cammie Sickle, MD 08/18/2020 1:09 PM

## 2020-08-18 NOTE — Progress Notes (Signed)
No mag today per Dr. Rogue Bussing. Gem/cis well tolerated. 231ml UOP prior to cisplatin. Discharged home in stable condition.

## 2020-08-19 ENCOUNTER — Inpatient Hospital Stay: Payer: Medicare Other

## 2020-08-19 LAB — CANCER ANTIGEN 19-9: CA 19-9: 112 U/mL — ABNORMAL HIGH (ref 0–35)

## 2020-08-19 NOTE — Progress Notes (Signed)
Nutrition ° °Patient was a no show for nutrition appointment today.   ° °Suzan Manon B. Emer Onnen, RD, LDN °Registered Dietitian °[phone] (mobile) ° ° °

## 2020-08-25 ENCOUNTER — Inpatient Hospital Stay: Payer: Medicare Other

## 2020-08-25 ENCOUNTER — Inpatient Hospital Stay: Payer: Medicare Other | Attending: Internal Medicine

## 2020-08-25 ENCOUNTER — Ambulatory Visit: Payer: Medicare Other | Admitting: Internal Medicine

## 2020-08-25 VITALS — BP 123/71 | HR 83 | Temp 97.6°F | Resp 20 | Wt 212.4 lb

## 2020-08-25 DIAGNOSIS — Z5111 Encounter for antineoplastic chemotherapy: Secondary | ICD-10-CM | POA: Insufficient documentation

## 2020-08-25 DIAGNOSIS — Z5189 Encounter for other specified aftercare: Secondary | ICD-10-CM | POA: Diagnosis not present

## 2020-08-25 DIAGNOSIS — C23 Malignant neoplasm of gallbladder: Secondary | ICD-10-CM | POA: Diagnosis not present

## 2020-08-25 LAB — COMPREHENSIVE METABOLIC PANEL
ALT: 16 U/L (ref 0–44)
AST: 23 U/L (ref 15–41)
Albumin: 3.9 g/dL (ref 3.5–5.0)
Alkaline Phosphatase: 59 U/L (ref 38–126)
Anion gap: 12 (ref 5–15)
BUN: 12 mg/dL (ref 8–23)
CO2: 25 mmol/L (ref 22–32)
Calcium: 9.1 mg/dL (ref 8.9–10.3)
Chloride: 102 mmol/L (ref 98–111)
Creatinine, Ser: 0.7 mg/dL (ref 0.44–1.00)
GFR, Estimated: >60 mL/min (ref >60)
Glucose, Bld: 195 mg/dL — ABNORMAL HIGH (ref 70–99)
Potassium: 3.7 mmol/L (ref 3.5–5.1)
Sodium: 139 mmol/L (ref 135–145)
Total Bilirubin: 0.5 mg/dL (ref 0.3–1.2)
Total Protein: 7.1 g/dL (ref 6.5–8.1)

## 2020-08-25 LAB — CBC WITH DIFFERENTIAL/PLATELET
Abs Immature Granulocytes: 0.01 10*3/uL (ref 0.00–0.07)
Basophils Absolute: 0.1 10*3/uL (ref 0.0–0.1)
Basophils Relative: 2 %
Eosinophils Absolute: 0.1 10*3/uL (ref 0.0–0.5)
Eosinophils Relative: 1 %
HCT: 32.7 % — ABNORMAL LOW (ref 36.0–46.0)
Hemoglobin: 10.5 g/dL — ABNORMAL LOW (ref 12.0–15.0)
Immature Granulocytes: 0 %
Lymphocytes Relative: 58 %
Lymphs Abs: 2.7 10*3/uL (ref 0.7–4.0)
MCH: 26.7 pg (ref 26.0–34.0)
MCHC: 32.1 g/dL (ref 30.0–36.0)
MCV: 83.2 fL (ref 80.0–100.0)
Monocytes Absolute: 0.7 10*3/uL (ref 0.1–1.0)
Monocytes Relative: 15 %
Neutro Abs: 1.1 10*3/uL — ABNORMAL LOW (ref 1.7–7.7)
Neutrophils Relative %: 24 %
Platelets: 309 10*3/uL (ref 150–400)
RBC: 3.93 MIL/uL (ref 3.87–5.11)
RDW: 18.1 % — ABNORMAL HIGH (ref 11.5–15.5)
WBC: 4.7 10*3/uL (ref 4.0–10.5)
nRBC: 0 % (ref 0.0–0.2)

## 2020-08-25 LAB — MAGNESIUM: Magnesium: 1.9 mg/dL (ref 1.7–2.4)

## 2020-08-25 MED ORDER — POTASSIUM CHLORIDE IN NACL 20-0.9 MEQ/L-% IV SOLN
Freq: Once | INTRAVENOUS | Status: AC
  Filled 2020-08-25: qty 1000

## 2020-08-25 MED ORDER — MAGNESIUM SULFATE 2 GM/50ML IV SOLN
2.0000 g | Freq: Once | INTRAVENOUS | Status: AC
  Administered 2020-08-25: 2 g via INTRAVENOUS
  Filled 2020-08-25: qty 50

## 2020-08-25 MED ORDER — DEXAMETHASONE SODIUM PHOSPHATE 100 MG/10ML IJ SOLN
10.0000 mg | Freq: Once | INTRAMUSCULAR | Status: AC
  Administered 2020-08-25: 10 mg via INTRAVENOUS
  Filled 2020-08-25: qty 10

## 2020-08-25 MED ORDER — SODIUM CHLORIDE 0.9 % IV SOLN
150.0000 mg | Freq: Once | INTRAVENOUS | Status: AC
  Administered 2020-08-25: 150 mg via INTRAVENOUS
  Filled 2020-08-25: qty 5

## 2020-08-25 MED ORDER — HEPARIN SOD (PORK) LOCK FLUSH 100 UNIT/ML IV SOLN
500.0000 [IU] | Freq: Once | INTRAVENOUS | Status: AC | PRN
  Administered 2020-08-25: 500 [IU]
  Filled 2020-08-25: qty 5

## 2020-08-25 MED ORDER — SODIUM CHLORIDE 0.9 % IV SOLN
Freq: Once | INTRAVENOUS | Status: AC
  Filled 2020-08-25: qty 250

## 2020-08-25 MED ORDER — SODIUM CHLORIDE 0.9% FLUSH
10.0000 mL | Freq: Once | INTRAVENOUS | Status: AC
  Administered 2020-08-25: 10 mL via INTRAVENOUS
  Filled 2020-08-25: qty 10

## 2020-08-25 MED ORDER — PEGFILGRASTIM 6 MG/0.6ML ~~LOC~~ PSKT
6.0000 mg | PREFILLED_SYRINGE | Freq: Once | SUBCUTANEOUS | Status: AC
  Administered 2020-08-25: 6 mg via SUBCUTANEOUS
  Filled 2020-08-25: qty 0.6

## 2020-08-25 MED ORDER — PALONOSETRON HCL INJECTION 0.25 MG/5ML
0.2500 mg | Freq: Once | INTRAVENOUS | Status: AC
  Administered 2020-08-25: 0.25 mg via INTRAVENOUS
  Filled 2020-08-25: qty 5

## 2020-08-25 MED ORDER — SODIUM CHLORIDE 0.9 % IV SOLN
2000.0000 mg | Freq: Once | INTRAVENOUS | Status: AC
  Administered 2020-08-25: 2000 mg via INTRAVENOUS
  Filled 2020-08-25: qty 52.6

## 2020-08-25 MED ORDER — SODIUM CHLORIDE 0.9 % IV SOLN
25.0000 mg/m2 | Freq: Once | INTRAVENOUS | Status: AC
  Administered 2020-08-25: 53 mg via INTRAVENOUS
  Filled 2020-08-25: qty 53

## 2020-08-25 MED ORDER — HEPARIN SOD (PORK) LOCK FLUSH 100 UNIT/ML IV SOLN
INTRAVENOUS | Status: AC
  Filled 2020-08-25: qty 5

## 2020-08-25 NOTE — Progress Notes (Signed)
ANC: 1100, Magnesium: 1.9. MD, Dr. Rogue Bussing, notified and aware. Per MD order: proceed with scheduled Gemzar and Cisplatin treatment today, and proceed with Magnesium Sulfate 2 g IVPB once administration today.  1611- Patient tolerated treatment well. Patient stable and discharged to home at this time.

## 2020-09-08 ENCOUNTER — Encounter: Payer: Self-pay | Admitting: Internal Medicine

## 2020-09-08 ENCOUNTER — Other Ambulatory Visit: Payer: Self-pay | Admitting: Internal Medicine

## 2020-09-08 ENCOUNTER — Inpatient Hospital Stay: Payer: Medicare Other

## 2020-09-08 ENCOUNTER — Inpatient Hospital Stay (HOSPITAL_BASED_OUTPATIENT_CLINIC_OR_DEPARTMENT_OTHER): Payer: Medicare Other | Admitting: Internal Medicine

## 2020-09-08 VITALS — BP 137/76 | HR 84

## 2020-09-08 DIAGNOSIS — Z5111 Encounter for antineoplastic chemotherapy: Secondary | ICD-10-CM | POA: Diagnosis not present

## 2020-09-08 DIAGNOSIS — C23 Malignant neoplasm of gallbladder: Secondary | ICD-10-CM

## 2020-09-08 LAB — COMPREHENSIVE METABOLIC PANEL
ALT: 14 U/L (ref 0–44)
AST: 24 U/L (ref 15–41)
Albumin: 3.7 g/dL (ref 3.5–5.0)
Alkaline Phosphatase: 72 U/L (ref 38–126)
Anion gap: 11 (ref 5–15)
BUN: 13 mg/dL (ref 8–23)
CO2: 26 mmol/L (ref 22–32)
Calcium: 8.9 mg/dL (ref 8.9–10.3)
Chloride: 101 mmol/L (ref 98–111)
Creatinine, Ser: 0.7 mg/dL (ref 0.44–1.00)
GFR, Estimated: >60 mL/min (ref >60)
Glucose, Bld: 236 mg/dL — ABNORMAL HIGH (ref 70–99)
Potassium: 3.7 mmol/L (ref 3.5–5.1)
Sodium: 138 mmol/L (ref 135–145)
Total Bilirubin: 0.4 mg/dL (ref 0.3–1.2)
Total Protein: 6.8 g/dL (ref 6.5–8.1)

## 2020-09-08 LAB — CBC WITH DIFFERENTIAL/PLATELET
Abs Immature Granulocytes: 0.3 10*3/uL — ABNORMAL HIGH (ref 0.00–0.07)
Basophils Absolute: 0.1 10*3/uL (ref 0.0–0.1)
Basophils Relative: 1 %
Eosinophils Absolute: 0.1 10*3/uL (ref 0.0–0.5)
Eosinophils Relative: 1 %
HCT: 30.9 % — ABNORMAL LOW (ref 36.0–46.0)
Hemoglobin: 10.1 g/dL — ABNORMAL LOW (ref 12.0–15.0)
Immature Granulocytes: 3 %
Lymphocytes Relative: 33 %
Lymphs Abs: 3.7 10*3/uL (ref 0.7–4.0)
MCH: 28 pg (ref 26.0–34.0)
MCHC: 32.7 g/dL (ref 30.0–36.0)
MCV: 85.6 fL (ref 80.0–100.0)
Monocytes Absolute: 1.3 10*3/uL — ABNORMAL HIGH (ref 0.1–1.0)
Monocytes Relative: 11 %
Neutro Abs: 5.7 10*3/uL (ref 1.7–7.7)
Neutrophils Relative %: 51 %
Platelets: 332 10*3/uL (ref 150–400)
RBC: 3.61 MIL/uL — ABNORMAL LOW (ref 3.87–5.11)
RDW: 20.2 % — ABNORMAL HIGH (ref 11.5–15.5)
WBC: 11.2 10*3/uL — ABNORMAL HIGH (ref 4.0–10.5)
nRBC: 0 % (ref 0.0–0.2)

## 2020-09-08 LAB — MAGNESIUM: Magnesium: 1.8 mg/dL (ref 1.7–2.4)

## 2020-09-08 MED ORDER — POTASSIUM CHLORIDE IN NACL 20-0.9 MEQ/L-% IV SOLN
Freq: Once | INTRAVENOUS | Status: AC
  Filled 2020-09-08: qty 1000

## 2020-09-08 MED ORDER — HEPARIN SOD (PORK) LOCK FLUSH 100 UNIT/ML IV SOLN
500.0000 [IU] | Freq: Once | INTRAVENOUS | Status: DC
  Filled 2020-09-08: qty 5

## 2020-09-08 MED ORDER — HEPARIN SOD (PORK) LOCK FLUSH 100 UNIT/ML IV SOLN
500.0000 [IU] | Freq: Once | INTRAVENOUS | Status: AC | PRN
  Administered 2020-09-08: 500 [IU]
  Filled 2020-09-08: qty 5

## 2020-09-08 MED ORDER — HEPARIN SOD (PORK) LOCK FLUSH 100 UNIT/ML IV SOLN
INTRAVENOUS | Status: AC
  Filled 2020-09-08: qty 5

## 2020-09-08 MED ORDER — SODIUM CHLORIDE 0.9 % IV SOLN
2000.0000 mg | Freq: Once | INTRAVENOUS | Status: AC
  Administered 2020-09-08: 2000 mg via INTRAVENOUS
  Filled 2020-09-08: qty 52.6

## 2020-09-08 MED ORDER — GLIPIZIDE 5 MG PO TABS: 5.0000 mg | ORAL_TABLET | Freq: Every day | ORAL | 1 refills | Status: DC

## 2020-09-08 MED ORDER — SODIUM CHLORIDE 0.9 % IV SOLN
150.0000 mg | Freq: Once | INTRAVENOUS | Status: AC
  Administered 2020-09-08: 150 mg via INTRAVENOUS
  Filled 2020-09-08: qty 150

## 2020-09-08 MED ORDER — SODIUM CHLORIDE 0.9% FLUSH
10.0000 mL | INTRAVENOUS | Status: DC | PRN
  Administered 2020-09-08: 10 mL via INTRAVENOUS
  Filled 2020-09-08: qty 10

## 2020-09-08 MED ORDER — PALONOSETRON HCL INJECTION 0.25 MG/5ML
0.2500 mg | Freq: Once | INTRAVENOUS | Status: AC
  Administered 2020-09-08: 0.25 mg via INTRAVENOUS
  Filled 2020-09-08: qty 5

## 2020-09-08 MED ORDER — SODIUM CHLORIDE 0.9 % IV SOLN
10.0000 mg | Freq: Once | INTRAVENOUS | Status: AC
  Administered 2020-09-08: 10 mg via INTRAVENOUS
  Filled 2020-09-08: qty 10

## 2020-09-08 MED ORDER — SODIUM CHLORIDE 0.9 % IV SOLN
Freq: Once | INTRAVENOUS | Status: AC
  Filled 2020-09-08: qty 250

## 2020-09-08 MED ORDER — MAGNESIUM SULFATE 2 GM/50ML IV SOLN
2.0000 g | Freq: Once | INTRAVENOUS | Status: AC
  Administered 2020-09-08: 2 g via INTRAVENOUS
  Filled 2020-09-08: qty 50

## 2020-09-08 MED ORDER — SODIUM CHLORIDE 0.9 % IV SOLN
25.0000 mg/m2 | Freq: Once | INTRAVENOUS | Status: AC
  Administered 2020-09-08: 53 mg via INTRAVENOUS
  Filled 2020-09-08: qty 53

## 2020-09-08 NOTE — Assessment & Plan Note (Addendum)
#  Incidental gallbladder adenocarcinoma- Stage III- T3N1; DEC 2021- ca-19-9- 4322. On NEO-ADJ chemo- Gem-Cis STABLE.   # Proceed with cycle#4; day-1. Labs today reviewed;  acceptable for treatment today.  Discussed that we will plan total of 6 cycles of neoadjuvant chemotherapy. Will repeat scan after 6 cycles.  Again reviewed importance of surgical intervention.  # Constipation: STABLE;  miralax BID; increased fluid intake.   # Blood glucose- post BF- 236; [declines metformin; will call with previous medication] . Monitor closely.   # HTN- elevated; STOPPED metoprolol 50 mg XL;  continue with cozaar. BP check at home; bring a log.    # DISPOSITION: # Chemo today   # follow up  1 weeks; labs- cbc/cmp/mag;-chemo Gem-Cis; onpro-  #  Follow up MD- 3 weeks; labs- cbc/cmp/mag; ca-19-9- Gem-Cis-Dr.B

## 2020-09-08 NOTE — Progress Notes (Signed)
[place] CONSULT NOTE  Patient Care Team: Revelo, Elyse Jarvis, MD as PCP - General (Family Medicine) Rico Junker, RN as Registered Nurse Christene Lye, MD (General Surgery) Clent Jacks, RN as Oncology Nurse Navigator  CHIEF COMPLAINTS/PURPOSE OF CONSULTATION: GALLBLADDER CANCER   #  Oncology History Overview Note  10/25- GALLBLADDER CANCER- [Dr.Cannon]/Dr.Byerly- laproscopic cholecystectomy- pT Category: pT3; pN1' POSITIVE for PNI; Histologic Type: Adenocarcinoma, biliary type  Histologic Grade: G3, poorly differentiated  Tumor Size: Greatest dimension: 2.2 cm  Tumor Extent: Tumor directly invades the liver  Lymphovascular Invasion: Present pre-treatement   Tumor Site: Fundus MARGINS  Margin Status for Invasive Carcinoma:      Margin Involved by Invasive Carcinoma: Liver parenchymal / hepatic  Bed; Number of Lymph Nodes with Tumor: 1       Number of Lymph Nodes Examined: 1    # October 25th 2021- Incidental gallbladder ADENOCA-s/p acute cholecystectomy [Dr.Cannon]. Stage III- T3N1; liver margin positive.  CT chest negative for any metastatic disease.  MRI abdomen with and without contrast-residual disease noted gallbladder bed; no evidence of distant metastases or lymphadenopathy. NOv 3rd 2021-CA 19-9 > 2000;     # NEOADJUVANT CHEMO-12/15-  Gem-cis [d1 & d-8 q 21 days] ; DEC 2021- Ca-19-03-4321.   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  female incidental pathologic gallbladder cancer stage III- currently on neoadjuvant chemotherapy- Gem-Cis is here a follow up.  Patient continues to take MiraLAX as needed for constipation.  Denies any diarrhea.  Denies any nausea vomiting abdominal pain.  Denies any tingling or numbness.  No tinnitus.   Review of Systems  Constitutional: Negative for chills, diaphoresis, fever and malaise/fatigue.  HENT: Negative for nosebleeds and sore throat.   Eyes: Negative for double vision.  Respiratory: Negative for cough, hemoptysis, sputum production, shortness of breath and wheezing.   Cardiovascular: Negative for chest pain, palpitations, orthopnea and leg swelling.  Gastrointestinal: Positive for constipation. Negative for abdominal pain, blood in stool, diarrhea, heartburn, melena, nausea and vomiting.  Genitourinary: Negative for dysuria, frequency and urgency.  Musculoskeletal: Negative for back pain and joint pain.  Skin: Negative.  Negative for itching and rash.  Neurological: Negative for dizziness, tingling, focal weakness, weakness and headaches.  Endo/Heme/Allergies: Does not bruise/bleed easily.  Psychiatric/Behavioral: Negative for depression. The patient is not nervous/anxious and does not have insomnia.      MEDICAL HISTORY:  Past Medical History:  Diagnosis Date  . Cancer ([place]) 05/17/2020   gallbladder  . Diabetes mellitus without complication ([place])   . Hypertension     SURGICAL HISTORY: Past Surgical History:  Procedure Laterality Date  . ABDOMINAL HYSTERECTOMY    . APPENDECTOMY  2004  . BREAST BIOPSY Left 2011  . BREAST BIOPSY Right 08/26/13  . breast  mass excision Right 08/28/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  . CHOLECYSTECTOMY N/A 05/17/2020   Procedure: LAPAROSCOPIC CHOLECYSTECTOMY;  Surgeon: Fredirick Maudlin, MD;  Location: [facility] ORS;  Service:  General;  Laterality: N/A;  . COLONOSCOPY WITH PROPOFOL N/A 01/31/2018   Procedure: COLONOSCOPY WITH PROPOFOL;  Surgeon: Jonathon Bellows, MD;  Location: [facility] ENDOSCOPY;  Service: Gastroenterology;  Laterality: N/A;  . PORTACATH PLACEMENT Right 07/02/2020   Procedure: INSERTION PORT-A-CATH, chemotherapy;  Surgeon: Fredirick Maudlin, MD;  Location: [facility] ORS;  Service: General;  Laterality: Right;    SOCIAL HISTORY: Social History   Socioeconomic History  . Marital status: Single    Spouse name: Not on file  . Number of children: Not on file  . Years of education: Not on file  . Highest education level: Not on file  Occupational History  . Occupation: special needs worker  Tobacco Use  . Smoking status: Never Smoker  . Smokeless tobacco: Never Used  Vaping Use  . Vaping Use: Never used  Substance and Sexual Activity  . Alcohol use: Yes    Comment: beer on weekend  . Drug use: No  . Sexual activity: Not Currently  Other Topics Concern  . Not on file  Social History Narrative   Lives in [place]; with son. 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 Never smoked; beer/iqour over weekends.    Social Determinants of Health   Financial Resource Strain: Not on file  Food Insecurity: Not on file  Transportation Needs: Not on file  Physical Activity: Not on file  Stress: Not on file  Social Connections: Not on file  Intimate Partner Violence: Not on file    FAMILY HISTORY: Family History  Problem Relation Age of Onset  . Pancreatic cancer Mother        AT 3 years  . Breast cancer Sister        appx-55 years  . Pancreatic cancer Maternal Grandmother     ALLERGIES:  is allergic to seasonal ic [cholestatin].  MEDICATIONS:  Current Outpatient Medications  Medication Sig Dispense Refill  . acetaminophen (TYLENOL) 500 MG tablet Take 500-1,000 mg by mouth every 6 (six) hours as needed for moderate pain or headache.    . Chlorpheniramine Maleate (ALLERGY RELIEF  PO) Take 1 tablet by mouth daily as needed (allergies).    . CINNAMON PO Take 1 tablet by mouth daily.    Marland Kitchen docusate sodium (COLACE) 100 MG capsule Take 100 mg by mouth daily as needed for mild constipation.    Marland Kitchen GARLIC PO Take 1 tablet by mouth daily.    Marland Kitchen lidocaine-prilocaine (EMLA) cream Apply 1 application topically as needed. Apply on the port 30-45 mins prior to port access 30 g 1  . losartan (COZAAR) 25 MG tablet Take 25 mg by mouth every morning.     . Multiple Vitamins-Minerals (MULTIVITAMIN WITH MINERALS) tablet Take 1 tablet by mouth daily.    . polyethylene glycol (MIRALAX / GLYCOLAX) 17 g packet Take 17 g by mouth daily.    . pravastatin (PRAVACHOL) 10 MG tablet Take 10 mg by mouth at bedtime.    . ondansetron (ZOFRAN) 8 MG tablet One pill every 8 hours as needed for nausea/vomitting. (Patient not taking: No sig reported) 40 tablet 1  . prochlorperazine (COMPAZINE) 10 MG tablet Take 1 tablet (10 mg total) by mouth every 6 (six) hours as needed for nausea or vomiting. (Patient not taking: No sig reported) 40 tablet 1   No current facility-administered medications for this visit.  Facility-Administered Medications Ordered in Other Visits  Medication Dose Route Frequency Provider Last Rate Last Admin  . heparin lock flush 100 unit/mL  500 Units Intravenous Once Charlaine Dalton R, MD      . sodium chloride flush (NS) 0.9 % injection 10 mL  10 mL Intravenous PRN Cammie Sickle, MD   10 mL at 09/08/20 7096      .  PHYSICAL EXAMINATION: ECOG PERFORMANCE STATUS: 0 - Asymptomatic  Vitals:   09/08/20 0846  BP: (!) 159/85  Pulse: 95  Resp: 16  Temp: 97.8 F (36.6 C)  SpO2: 100%   Filed Weights   09/08/20 0846  Weight: 213 lb 12.8 oz (97 kg)    Physical Exam HENT:     Head: Normocephalic and atraumatic.     Mouth/Throat:     Pharynx: No oropharyngeal exudate.  Eyes:     Pupils: Pupils are equal, round, and reactive to light.  Cardiovascular:     Rate and  Rhythm: Normal rate and regular rhythm.  Pulmonary:     Effort: Pulmonary effort is normal. No respiratory distress.     Breath sounds: Normal breath sounds. No wheezing.  Abdominal:     General: Bowel sounds are normal. There is no distension.     Palpations: Abdomen is soft. There is no mass.     Tenderness: There is no abdominal tenderness. There is no guarding or rebound.  Musculoskeletal:        General: No tenderness. Normal range of motion.     Cervical back: Normal range of motion and neck supple.  Skin:    General: Skin is warm.  Neurological:     Mental Status: She is alert and oriented to person, place, and time.  Psychiatric:        Mood and Affect: Affect normal.      LABORATORY DATA:  I have reviewed the data as listed Lab Results  Component Value Date   WBC 11.2 (H) 09/08/2020   HGB 10.1 (L) 09/08/2020   HCT 30.9 (L) 09/08/2020   MCV 85.6 09/08/2020   PLT 332 09/08/2020   Recent Labs    08/18/20 0836 08/25/20 0827 09/08/20 0822  NA 138 139 138  K 3.7 3.7 3.7  CL 104 102 101  CO2 '29 25 26  ' GLUCOSE 169* 195* 236*  BUN '14 12 13  ' CREATININE 0.70 0.70 0.70  CALCIUM 8.8* 9.1 8.9  GFRNONAA >60 >60 >60  PROT 6.9 7.1 6.8  ALBUMIN 3.8 3.9 3.7  AST '19 23 24  ' ALT '15 16 14  ' ALKPHOS 75 59 72  BILITOT 0.5 0.5 0.4    RADIOGRAPHIC STUDIES: I have personally reviewed the radiological images as listed and agreed with the findings in the report. No results found.  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 On NEO-ADJ chemo- Gem-Cis STABLE.   # Proceed with cycle#4; day-1. Labs today reviewed;  acceptable for treatment today.  Discussed that we will plan total of 6 cycles of neoadjuvant chemotherapy. Will repeat scan after 6 cycles.  Again reviewed importance of surgical intervention.  # Constipation: STABLE;  miralax BID; increased fluid intake.   #  Blood glucose- post BF- 236; [declines metformin; will call with previous medication] . Monitor closely.   # HTN- elevated; STOPPED metoprolol 50 mg XL;  continue with cozaar. BP check at home; bring a log.    # DISPOSITION: #  Chemo today   # follow up  1 weeks; labs- cbc/cmp/mag;-chemo Gem-Cis; onpro-  #  Follow up MD- 3 weeks; labs- cbc/cmp/mag; ca-19-9- Gem-Cis-Dr.B    All questions were answered. The patient knows to call the clinic with any problems, questions or concerns.   Cammie Sickle, MD 09/08/2020 9:22 AM

## 2020-09-08 NOTE — Progress Notes (Signed)
Sent glipizide script to [facility]

## 2020-09-09 LAB — CANCER ANTIGEN 19-9: CA 19-9: 38 U/mL — ABNORMAL HIGH (ref 0–35)

## 2020-09-15 ENCOUNTER — Inpatient Hospital Stay: Payer: Medicare Other

## 2020-09-15 ENCOUNTER — Other Ambulatory Visit: Payer: Medicare Other

## 2020-09-15 ENCOUNTER — Ambulatory Visit: Payer: Medicare Other | Admitting: Internal Medicine

## 2020-09-15 ENCOUNTER — Other Ambulatory Visit: Payer: Self-pay | Admitting: Internal Medicine

## 2020-09-15 ENCOUNTER — Ambulatory Visit: Payer: Medicare Other

## 2020-09-15 DIAGNOSIS — Z5111 Encounter for antineoplastic chemotherapy: Secondary | ICD-10-CM | POA: Diagnosis not present

## 2020-09-15 DIAGNOSIS — C23 Malignant neoplasm of gallbladder: Secondary | ICD-10-CM

## 2020-09-15 LAB — COMPREHENSIVE METABOLIC PANEL
ALT: 14 U/L (ref 0–44)
AST: 19 U/L (ref 15–41)
Albumin: 4 g/dL (ref 3.5–5.0)
Alkaline Phosphatase: 58 U/L (ref 38–126)
Anion gap: 11 (ref 5–15)
BUN: 15 mg/dL (ref 8–23)
CO2: 26 mmol/L (ref 22–32)
Calcium: 9 mg/dL (ref 8.9–10.3)
Chloride: 101 mmol/L (ref 98–111)
Creatinine, Ser: 0.7 mg/dL (ref 0.44–1.00)
GFR, Estimated: >60 mL/min (ref >60)
Glucose, Bld: 201 mg/dL — ABNORMAL HIGH (ref 70–99)
Potassium: 3.8 mmol/L (ref 3.5–5.1)
Sodium: 138 mmol/L (ref 135–145)
Total Bilirubin: 0.4 mg/dL (ref 0.3–1.2)
Total Protein: 7.2 g/dL (ref 6.5–8.1)

## 2020-09-15 LAB — CBC WITH DIFFERENTIAL/PLATELET
Abs Immature Granulocytes: 0.02 10*3/uL (ref 0.00–0.07)
Basophils Absolute: 0.1 10*3/uL (ref 0.0–0.1)
Basophils Relative: 2 %
Eosinophils Absolute: 0 10*3/uL (ref 0.0–0.5)
Eosinophils Relative: 1 %
HCT: 30.9 % — ABNORMAL LOW (ref 36.0–46.0)
Hemoglobin: 10 g/dL — ABNORMAL LOW (ref 12.0–15.0)
Immature Granulocytes: 1 %
Lymphocytes Relative: 64 %
Lymphs Abs: 2.5 10*3/uL (ref 0.7–4.0)
MCH: 27.9 pg (ref 26.0–34.0)
MCHC: 32.4 g/dL (ref 30.0–36.0)
MCV: 86.1 fL (ref 80.0–100.0)
Monocytes Absolute: 0.4 10*3/uL (ref 0.1–1.0)
Monocytes Relative: 11 %
Neutro Abs: 0.8 10*3/uL — ABNORMAL LOW (ref 1.7–7.7)
Neutrophils Relative %: 21 %
Platelets: 367 10*3/uL (ref 150–400)
RBC: 3.59 MIL/uL — ABNORMAL LOW (ref 3.87–5.11)
RDW: 19.7 % — ABNORMAL HIGH (ref 11.5–15.5)
WBC: 3.9 10*3/uL — ABNORMAL LOW (ref 4.0–10.5)
nRBC: 0 % (ref 0.0–0.2)

## 2020-09-15 MED ORDER — SODIUM CHLORIDE 0.9 % IV SOLN
Freq: Once | INTRAVENOUS | Status: AC
  Filled 2020-09-15: qty 250

## 2020-09-15 MED ORDER — POTASSIUM CHLORIDE IN NACL 20-0.9 MEQ/L-% IV SOLN
Freq: Once | INTRAVENOUS | Status: AC
  Filled 2020-09-15: qty 1000

## 2020-09-15 MED ORDER — PALONOSETRON HCL INJECTION 0.25 MG/5ML
0.2500 mg | Freq: Once | INTRAVENOUS | Status: AC
  Administered 2020-09-15: 0.25 mg via INTRAVENOUS
  Filled 2020-09-15: qty 5

## 2020-09-15 MED ORDER — HEPARIN SOD (PORK) LOCK FLUSH 100 UNIT/ML IV SOLN
500.0000 [IU] | Freq: Once | INTRAVENOUS | Status: AC
  Administered 2020-09-15: 500 [IU] via INTRAVENOUS
  Filled 2020-09-15: qty 5

## 2020-09-15 MED ORDER — SODIUM CHLORIDE 0.9% FLUSH
10.0000 mL | INTRAVENOUS | Status: DC | PRN
  Administered 2020-09-15: 10 mL via INTRAVENOUS
  Filled 2020-09-15: qty 10

## 2020-09-15 MED ORDER — SODIUM CHLORIDE 0.9 % IV SOLN
150.0000 mg | Freq: Once | INTRAVENOUS | Status: AC
  Administered 2020-09-15: 150 mg via INTRAVENOUS
  Filled 2020-09-15: qty 150

## 2020-09-15 MED ORDER — MAGNESIUM SULFATE 2 GM/50ML IV SOLN
2.0000 g | Freq: Once | INTRAVENOUS | Status: AC
  Administered 2020-09-15: 2 g via INTRAVENOUS
  Filled 2020-09-15: qty 50

## 2020-09-15 MED ORDER — SODIUM CHLORIDE 0.9 % IV SOLN
25.0000 mg/m2 | Freq: Once | INTRAVENOUS | Status: AC
  Administered 2020-09-15: 53 mg via INTRAVENOUS
  Filled 2020-09-15: qty 53

## 2020-09-15 MED ORDER — SODIUM CHLORIDE 0.9 % IV SOLN
10.0000 mg | Freq: Once | INTRAVENOUS | Status: AC
  Administered 2020-09-15: 10 mg via INTRAVENOUS
  Filled 2020-09-15: qty 10

## 2020-09-15 MED ORDER — SODIUM CHLORIDE 0.9 % IV SOLN
2000.0000 mg | Freq: Once | INTRAVENOUS | Status: AC
  Administered 2020-09-15: 2000 mg via INTRAVENOUS
  Filled 2020-09-15: qty 52.6

## 2020-09-15 MED ORDER — PEGFILGRASTIM 6 MG/0.6ML ~~LOC~~ PSKT
6.0000 mg | PREFILLED_SYRINGE | Freq: Once | SUBCUTANEOUS | Status: AC
  Administered 2020-09-15: 6 mg via SUBCUTANEOUS
  Filled 2020-09-15: qty 0.6

## 2020-09-15 NOTE — Progress Notes (Unsigned)
Labs reviewed. anc 0.8. ok to tx per MD. Pt receiving Onpro today, and he will re-evaluate doses for next time.

## 2020-09-15 NOTE — Progress Notes (Signed)
Labs reviewed per MD, ok to treat today.  Patient getting onpro and will evaluate doses for next time.

## 2020-09-23 ENCOUNTER — Other Ambulatory Visit: Payer: Self-pay

## 2020-09-23 DIAGNOSIS — C23 Malignant neoplasm of gallbladder: Secondary | ICD-10-CM

## 2020-09-29 ENCOUNTER — Inpatient Hospital Stay: Payer: Medicare Other | Attending: Internal Medicine

## 2020-09-29 ENCOUNTER — Inpatient Hospital Stay (HOSPITAL_BASED_OUTPATIENT_CLINIC_OR_DEPARTMENT_OTHER): Payer: Medicare Other | Admitting: Internal Medicine

## 2020-09-29 ENCOUNTER — Encounter: Payer: Self-pay | Admitting: Internal Medicine

## 2020-09-29 ENCOUNTER — Inpatient Hospital Stay: Payer: Medicare Other

## 2020-09-29 ENCOUNTER — Other Ambulatory Visit: Payer: Self-pay

## 2020-09-29 VITALS — BP 156/80 | HR 92 | Temp 99.0°F | Resp 16 | Ht 66.0 in | Wt 216.0 lb

## 2020-09-29 DIAGNOSIS — C23 Malignant neoplasm of gallbladder: Secondary | ICD-10-CM

## 2020-09-29 DIAGNOSIS — E876 Hypokalemia: Secondary | ICD-10-CM | POA: Insufficient documentation

## 2020-09-29 DIAGNOSIS — Z5111 Encounter for antineoplastic chemotherapy: Secondary | ICD-10-CM | POA: Insufficient documentation

## 2020-09-29 LAB — COMPREHENSIVE METABOLIC PANEL
ALT: 13 U/L (ref 0–44)
AST: 22 U/L (ref 15–41)
Albumin: 3.9 g/dL (ref 3.5–5.0)
Alkaline Phosphatase: 72 U/L (ref 38–126)
Anion gap: 10 (ref 5–15)
BUN: 13 mg/dL (ref 8–23)
CO2: 26 mmol/L (ref 22–32)
Calcium: 8.7 mg/dL — ABNORMAL LOW (ref 8.9–10.3)
Chloride: 104 mmol/L (ref 98–111)
Creatinine, Ser: 0.65 mg/dL (ref 0.44–1.00)
GFR, Estimated: >60 mL/min (ref >60)
Glucose, Bld: 177 mg/dL — ABNORMAL HIGH (ref 70–99)
Potassium: 3.4 mmol/L — ABNORMAL LOW (ref 3.5–5.1)
Sodium: 140 mmol/L (ref 135–145)
Total Bilirubin: 0.5 mg/dL (ref 0.3–1.2)
Total Protein: 7 g/dL (ref 6.5–8.1)

## 2020-09-29 LAB — CBC WITH DIFFERENTIAL/PLATELET
Abs Immature Granulocytes: 0.54 10*3/uL — ABNORMAL HIGH (ref 0.00–0.07)
Basophils Absolute: 0.1 10*3/uL (ref 0.0–0.1)
Basophils Relative: 1 %
Eosinophils Absolute: 0.1 10*3/uL (ref 0.0–0.5)
Eosinophils Relative: 1 %
HCT: 30.3 % — ABNORMAL LOW (ref 36.0–46.0)
Hemoglobin: 9.5 g/dL — ABNORMAL LOW (ref 12.0–15.0)
Immature Granulocytes: 4 %
Lymphocytes Relative: 29 %
Lymphs Abs: 3.9 10*3/uL (ref 0.7–4.0)
MCH: 27.9 pg (ref 26.0–34.0)
MCHC: 31.4 g/dL (ref 30.0–36.0)
MCV: 89.1 fL (ref 80.0–100.0)
Monocytes Absolute: 1.6 10*3/uL — ABNORMAL HIGH (ref 0.1–1.0)
Monocytes Relative: 12 %
Neutro Abs: 7.1 10*3/uL (ref 1.7–7.7)
Neutrophils Relative %: 53 %
Platelets: 259 10*3/uL (ref 150–400)
RBC: 3.4 MIL/uL — ABNORMAL LOW (ref 3.87–5.11)
RDW: 21.9 % — ABNORMAL HIGH (ref 11.5–15.5)
WBC: 13.3 10*3/uL — ABNORMAL HIGH (ref 4.0–10.5)
nRBC: 0.2 % (ref 0.0–0.2)

## 2020-09-29 LAB — MAGNESIUM: Magnesium: 1.8 mg/dL (ref 1.7–2.4)

## 2020-09-29 MED ORDER — PALONOSETRON HCL INJECTION 0.25 MG/5ML
0.2500 mg | Freq: Once | INTRAVENOUS | Status: AC
  Administered 2020-09-29: 0.25 mg via INTRAVENOUS
  Filled 2020-09-29: qty 5

## 2020-09-29 MED ORDER — SODIUM CHLORIDE 0.9 % IV SOLN
Freq: Once | INTRAVENOUS | Status: DC
  Filled 2020-09-29: qty 250

## 2020-09-29 MED ORDER — HEPARIN SOD (PORK) LOCK FLUSH 100 UNIT/ML IV SOLN
500.0000 [IU] | Freq: Once | INTRAVENOUS | Status: AC | PRN
  Administered 2020-09-29: 500 [IU]
  Filled 2020-09-29: qty 5

## 2020-09-29 MED ORDER — SODIUM CHLORIDE 0.9 % IV SOLN
10.0000 mg | Freq: Once | INTRAVENOUS | Status: AC
  Administered 2020-09-29: 10 mg via INTRAVENOUS
  Filled 2020-09-29: qty 10

## 2020-09-29 MED ORDER — SODIUM CHLORIDE 0.9 % IV SOLN
Freq: Once | INTRAVENOUS | Status: AC
  Filled 2020-09-29: qty 250

## 2020-09-29 MED ORDER — HEPARIN SOD (PORK) LOCK FLUSH 100 UNIT/ML IV SOLN
INTRAVENOUS | Status: AC
  Filled 2020-09-29: qty 5

## 2020-09-29 MED ORDER — POTASSIUM CHLORIDE IN NACL 20-0.9 MEQ/L-% IV SOLN
Freq: Once | INTRAVENOUS | Status: AC
  Filled 2020-09-29: qty 1000

## 2020-09-29 MED ORDER — SODIUM CHLORIDE 0.9 % IV SOLN
150.0000 mg | Freq: Once | INTRAVENOUS | Status: AC
  Administered 2020-09-29: 150 mg via INTRAVENOUS
  Filled 2020-09-29: qty 150

## 2020-09-29 MED ORDER — MAGNESIUM SULFATE 2 GM/50ML IV SOLN
2.0000 g | Freq: Once | INTRAVENOUS | Status: AC
  Administered 2020-09-29: 2 g via INTRAVENOUS
  Filled 2020-09-29: qty 50

## 2020-09-29 MED ORDER — SODIUM CHLORIDE 0.9 % IV SOLN
25.0000 mg/m2 | Freq: Once | INTRAVENOUS | Status: AC
  Administered 2020-09-29: 53 mg via INTRAVENOUS
  Filled 2020-09-29: qty 53

## 2020-09-29 MED ORDER — SODIUM CHLORIDE 0.9 % IV SOLN
2000.0000 mg | Freq: Once | INTRAVENOUS | Status: AC
  Administered 2020-09-29: 2000 mg via INTRAVENOUS
  Filled 2020-09-29: qty 52.6

## 2020-09-29 MED ORDER — POTASSIUM CHLORIDE CRYS ER 20 MEQ PO TBCR: EXTENDED_RELEASE_TABLET | ORAL | 3 refills | Status: DC

## 2020-09-29 NOTE — Progress Notes (Signed)
[facility] CONSULT NOTE  Patient Care Team: Revelo, Elyse Jarvis, MD as PCP - General (Family Medicine) Rico Junker, RN as Registered Nurse Christene Lye, MD (General Surgery) Clent Jacks, RN as Oncology Nurse Navigator  CHIEF COMPLAINTS/PURPOSE OF CONSULTATION: GALLBLADDER CANCER   #  Oncology History Overview Note  10/25- GALLBLADDER CANCER- [Dr.Cannon]/Dr.Byerly- laproscopic cholecystectomy- pT Category: pT3; pN1' POSITIVE for PNI; Histologic Type: Adenocarcinoma, biliary type  Histologic Grade: G3, poorly differentiated  Tumor Size: Greatest dimension: 2.2 cm  Tumor Extent: Tumor directly invades the liver  Lymphovascular Invasion: Present pre-treatement   Tumor Site: Fundus MARGINS  Margin Status for Invasive Carcinoma:      Margin Involved by Invasive Carcinoma: Liver parenchymal / hepatic  Bed; Number of Lymph Nodes with Tumor: 1       Number of Lymph Nodes Examined: 1    # October 25th 2021- Incidental gallbladder ADENOCA-s/p acute cholecystectomy [Dr.Cannon]. Stage III- T3N1; liver margin positive.  CT chest negative for any metastatic disease.  MRI abdomen with and without contrast-residual disease noted gallbladder bed; no evidence of distant metastases or lymphadenopathy. NOv 3rd 2021-CA 19-9 > 2000;     # NEOADJUVANT CHEMO-12/15-  Gem-cis [d1 & d-8 q 21 days] ; DEC 2021- Ca-19-03-4321.   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  female incidental pathologic gallbladder cancer stage III- currently on neoadjuvant chemotherapy- Gem-Cis is here a follow up.  Continues MiraLAX as needed for constipation.  No diarrhea.  Notes to have constipation mostly after chemotherapy.  No significant nausea abdominal pain.  Denies any worsening tingling numbness no hearing difficulty.  Review of Systems  Constitutional: Negative for chills, diaphoresis, fever and malaise/fatigue.  HENT: Negative for nosebleeds and sore throat.   Eyes: Negative for double vision.  Respiratory: Negative for cough, hemoptysis, sputum production, shortness of breath and wheezing.   Cardiovascular: Negative for chest pain, palpitations, orthopnea and leg swelling.  Gastrointestinal: Positive for constipation. Negative for abdominal pain, blood in stool, diarrhea, heartburn, melena, nausea and vomiting.  Genitourinary: Negative for dysuria, frequency and urgency.  Musculoskeletal: Negative for back pain and joint pain.  Skin: Negative.  Negative for itching and rash.  Neurological: Negative for dizziness, tingling, focal weakness, weakness and headaches.  Endo/Heme/Allergies: Does not bruise/bleed easily.  Psychiatric/Behavioral: Negative for depression. The patient is not nervous/anxious and does not have insomnia.      MEDICAL HISTORY:  Past Medical History:  Diagnosis Date  . Cancer ([place]) 05/17/2020   gallbladder  . Diabetes mellitus without complication ([place])   . Hypertension     SURGICAL HISTORY: Past Surgical History:  Procedure Laterality Date  . ABDOMINAL HYSTERECTOMY    . APPENDECTOMY  2004  . BREAST BIOPSY Left 2011  . BREAST BIOPSY Right 08/26/13  .  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/28/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  . CHOLECYSTECTOMY N/A 05/17/2020   Procedure: LAPAROSCOPIC CHOLECYSTECTOMY;  Surgeon: Fredirick Maudlin,  MD;  Location: [facility] ORS;  Service: General;  Laterality: N/A;  . COLONOSCOPY WITH PROPOFOL N/A 01/31/2018   Procedure: COLONOSCOPY WITH PROPOFOL;  Surgeon: Jonathon Bellows, MD;  Location: [facility] ENDOSCOPY;  Service: Gastroenterology;  Laterality: N/A;  . PORTACATH PLACEMENT Right 07/02/2020   Procedure: INSERTION PORT-A-CATH, chemotherapy;  Surgeon: Fredirick Maudlin, MD;  Location: [facility] ORS;  Service: General;  Laterality: Right;    SOCIAL HISTORY: Social History   Socioeconomic History  . Marital status: Single    Spouse name: Not on file  . Number of children: Not on file  . Years of education: Not on file  . Highest education level: Not on file  Occupational History  . Occupation: special needs worker  Tobacco Use  . Smoking status: Never Smoker  . Smokeless tobacco: Never Used  Vaping Use  . Vaping Use: Never used  Substance and Sexual Activity  . Alcohol use: Yes    Comment: beer on weekend  . Drug use: No  . Sexual activity: Not Currently  Other Topics Concern  . Not on file  Social History Narrative   Lives in [place]; with son. 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 Never smoked; beer/iqour over weekends.    Social Determinants of Health   Financial Resource Strain: Not on file  Food Insecurity: Not on file  Transportation Needs: Not on file  Physical Activity: Not on file  Stress: Not on file  Social Connections: Not on file  Intimate Partner Violence: Not on file    FAMILY HISTORY: Family History  Problem Relation Age of Onset  . Pancreatic cancer Mother        AT 12 years  . Breast cancer Sister        appx-55 years  . Pancreatic cancer Maternal Grandmother     ALLERGIES:  is allergic to seasonal ic [cholestatin].  MEDICATIONS:  Current Outpatient Medications  Medication Sig Dispense Refill  . acetaminophen (TYLENOL) 500 MG tablet Take 500-1,000 mg by mouth every 6 (six) hours as needed for moderate pain or headache.    .  Chlorpheniramine Maleate (ALLERGY RELIEF PO) Take 1 tablet by mouth daily as needed (allergies).    . CINNAMON PO Take 1 tablet by mouth daily.    Marland Kitchen docusate sodium (COLACE) 100 MG capsule Take 100 mg by mouth daily as needed for mild constipation.    Marland Kitchen GARLIC PO Take 1 tablet by mouth daily.    Marland Kitchen glipiZIDE (GLUCOTROL) 5 MG tablet Take 1 tablet (5 mg total) by mouth daily before breakfast. 30 tablet 1  . lidocaine-prilocaine (EMLA) cream Apply 1 application topically as needed. Apply on the port 30-45 mins prior to port access 30 g 1  . losartan (COZAAR) 25 MG tablet Take 25 mg by mouth every morning.     . Multiple Vitamins-Minerals (MULTIVITAMIN WITH MINERALS) tablet Take 1 tablet by mouth daily.    . ondansetron (ZOFRAN) 8 MG tablet One pill every 8 hours as needed for nausea/vomitting. 40 tablet 1  . polyethylene glycol (MIRALAX / GLYCOLAX) 17 g packet Take 17 g by mouth daily.    . potassium chloride SA (KLOR-CON) 20 MEQ tablet 1 pill twice a day 60 tablet 3  . pravastatin (PRAVACHOL) 10 MG tablet Take 10 mg by mouth at bedtime.    . prochlorperazine (COMPAZINE) 10 MG tablet Take 1 tablet (10 mg  total) by mouth every 6 (six) hours as needed for nausea or vomiting. 40 tablet 1   No current facility-administered medications for this visit.      Marland Kitchen  PHYSICAL EXAMINATION: ECOG PERFORMANCE STATUS: 0 - Asymptomatic  Vitals:   09/29/20 0846  BP: (!) 156/80  Pulse: 92  Resp: 16  Temp: 99 F (37.2 C)  SpO2: 100%   Filed Weights   09/29/20 0846  Weight: 216 lb (98 kg)    Physical Exam HENT:     Head: Normocephalic and atraumatic.     Mouth/Throat:     Pharynx: No oropharyngeal exudate.  Eyes:     Pupils: Pupils are equal, round, and reactive to light.  Cardiovascular:     Rate and Rhythm: Normal rate and regular rhythm.  Pulmonary:     Effort: Pulmonary effort is normal. No respiratory distress.     Breath sounds: Normal breath sounds. No wheezing.  Abdominal:     General:  Bowel sounds are normal. There is no distension.     Palpations: Abdomen is soft. There is no mass.     Tenderness: There is no abdominal tenderness. There is no guarding or rebound.  Musculoskeletal:        General: No tenderness. Normal range of motion.     Cervical back: Normal range of motion and neck supple.  Skin:    General: Skin is warm.  Neurological:     Mental Status: She is alert and oriented to person, place, and time.  Psychiatric:        Mood and Affect: Affect normal.      LABORATORY DATA:  I have reviewed the data as listed Lab Results  Component Value Date   WBC 13.3 (H) 09/29/2020   HGB 9.5 (L) 09/29/2020   HCT 30.3 (L) 09/29/2020   MCV 89.1 09/29/2020   PLT 259 09/29/2020   Recent Labs    09/08/20 0822 09/15/20 0815 09/29/20 0821  NA 138 138 140  K 3.7 3.8 3.4*  CL 101 101 104  CO2 '26 26 26  ' GLUCOSE 236* 201* 177*  BUN '13 15 13  ' CREATININE 0.70 0.70 0.65  CALCIUM 8.9 9.0 8.7*  GFRNONAA >60 >60 >60  PROT 6.8 7.2 7.0  ALBUMIN 3.7 4.0 3.9  AST '24 19 22  ' ALT '14 14 13  ' ALKPHOS 72 58 72  BILITOT 0.4 0.4 0.5    RADIOGRAPHIC STUDIES: I have personally reviewed the radiological images as listed and agreed with the findings in the report. No results found.  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 On NEO-ADJ chemo- Gem-Cis STABLE.   # Proceed with cycle#5; day-1. Labs today reviewed;  acceptable for treatment today.  Discussed that we will plan total of 6 cycles of neoadjuvant chemotherapy. Will order at next visit.   # Constipation: STABLE;  miralax BID; increased fluid intake.   # Blood glucose- Fasting- 134; [started on glipizide 5 mg/day. Monitor closely.   # HTN- elevated; STOPPED metoprolol 50 mg XL;  continue with cozaar. BP check at home; bring a log.   # Hypokalemia- 3.4- sec to cisplatin; start Kdur 20 meQ ; plan potential IV  KCl in the future.   # DISPOSITION: # Chemo today   # 1 weeks; labs- cbc/cmp/mag;-chemo Gem-Cis; onpro; ADD-possible kcl 20 meq  #  Follow up MD- 3 weeks; labs- cbc/cmp/mag; ca-19-9- Gem-Cis' possible kcl 20 meq  #  in 4 weeks- labs- cbc/cmp/mag;-chemo Gem-Cis; onpro; ADD-possible kcl 20 meq -Dr.B    All questions were answered. The patient knows to call the clinic with any problems, questions or concerns.   Cammie Sickle, MD 09/29/2020 9:12 AM

## 2020-09-29 NOTE — Assessment & Plan Note (Addendum)
#  Incidental gallbladder adenocarcinoma- Stage III- T3N1; DEC 2021- ca-19-9- 4322. On NEO-ADJ chemo- Gem-Cis STABLE.   # Proceed with cycle#5; day-1. Labs today reviewed;  acceptable for treatment today.  Discussed that we will plan total of 6 cycles of neoadjuvant chemotherapy. Will order at next visit.   # Constipation: STABLE;  miralax BID; increased fluid intake.   # Blood glucose- Fasting- 134; [started on glipizide 5 mg/day. Monitor closely.   # HTN- elevated; STOPPED metoprolol 50 mg XL;  continue with cozaar. BP check at home; bring a log.   # Hypokalemia- 3.4- sec to cisplatin; start Kdur 20 meQ ; plan potential IV KCl in the future.   # DISPOSITION: # Chemo today   # 1 weeks; labs- cbc/cmp/mag;-chemo Gem-Cis; onpro; ADD-possible kcl 20 meq  #  Follow up MD- 3 weeks; labs- cbc/cmp/mag; ca-19-9- Gem-Cis' possible kcl 20 meq  # in 4 weeks- labs- cbc/cmp/mag;-chemo Gem-Cis; onpro; ADD-possible kcl 20 meq -Dr.B

## 2020-09-30 LAB — CANCER ANTIGEN 19-9: CA 19-9: 23 U/mL (ref 0–35)

## 2020-10-06 ENCOUNTER — Inpatient Hospital Stay: Payer: Medicare Other

## 2020-10-06 VITALS — BP 120/75 | HR 88 | Temp 96.5°F | Resp 20 | Wt 213.0 lb

## 2020-10-06 DIAGNOSIS — Z5111 Encounter for antineoplastic chemotherapy: Secondary | ICD-10-CM | POA: Diagnosis not present

## 2020-10-06 DIAGNOSIS — C23 Malignant neoplasm of gallbladder: Secondary | ICD-10-CM

## 2020-10-06 LAB — CBC WITH DIFFERENTIAL/PLATELET
Abs Immature Granulocytes: 0.02 10*3/uL (ref 0.00–0.07)
Basophils Absolute: 0.1 10*3/uL (ref 0.0–0.1)
Basophils Relative: 2 %
Eosinophils Absolute: 0 10*3/uL (ref 0.0–0.5)
Eosinophils Relative: 1 %
HCT: 29.6 % — ABNORMAL LOW (ref 36.0–46.0)
Hemoglobin: 9.5 g/dL — ABNORMAL LOW (ref 12.0–15.0)
Immature Granulocytes: 1 %
Lymphocytes Relative: 61 %
Lymphs Abs: 2.7 10*3/uL (ref 0.7–4.0)
MCH: 28.3 pg (ref 26.0–34.0)
MCHC: 32.1 g/dL (ref 30.0–36.0)
MCV: 88.1 fL (ref 80.0–100.0)
Monocytes Absolute: 0.7 10*3/uL (ref 0.1–1.0)
Monocytes Relative: 16 %
Neutro Abs: 0.8 10*3/uL — ABNORMAL LOW (ref 1.7–7.7)
Neutrophils Relative %: 19 %
Platelets: 340 10*3/uL (ref 150–400)
RBC: 3.36 MIL/uL — ABNORMAL LOW (ref 3.87–5.11)
RDW: 20.5 % — ABNORMAL HIGH (ref 11.5–15.5)
WBC: 4.3 10*3/uL (ref 4.0–10.5)
nRBC: 0 % (ref 0.0–0.2)

## 2020-10-06 LAB — COMPREHENSIVE METABOLIC PANEL
ALT: 14 U/L (ref 0–44)
AST: 17 U/L (ref 15–41)
Albumin: 4.1 g/dL (ref 3.5–5.0)
Alkaline Phosphatase: 56 U/L (ref 38–126)
Anion gap: 10 (ref 5–15)
BUN: 19 mg/dL (ref 8–23)
CO2: 25 mmol/L (ref 22–32)
Calcium: 9 mg/dL (ref 8.9–10.3)
Chloride: 104 mmol/L (ref 98–111)
Creatinine, Ser: 0.75 mg/dL (ref 0.44–1.00)
GFR, Estimated: >60 mL/min (ref >60)
Glucose, Bld: 143 mg/dL — ABNORMAL HIGH (ref 70–99)
Potassium: 4.2 mmol/L (ref 3.5–5.1)
Sodium: 139 mmol/L (ref 135–145)
Total Bilirubin: 0.6 mg/dL (ref 0.3–1.2)
Total Protein: 7.1 g/dL (ref 6.5–8.1)

## 2020-10-06 LAB — MAGNESIUM: Magnesium: 1.9 mg/dL (ref 1.7–2.4)

## 2020-10-06 MED ORDER — SODIUM CHLORIDE 0.9% FLUSH
10.0000 mL | INTRAVENOUS | Status: DC | PRN
  Administered 2020-10-06: 10 mL via INTRAVENOUS
  Filled 2020-10-06: qty 10

## 2020-10-06 MED ORDER — SODIUM CHLORIDE 0.9 % IV SOLN
150.0000 mg | Freq: Once | INTRAVENOUS | Status: AC
  Administered 2020-10-06: 150 mg via INTRAVENOUS
  Filled 2020-10-06: qty 150

## 2020-10-06 MED ORDER — HEPARIN SOD (PORK) LOCK FLUSH 100 UNIT/ML IV SOLN
INTRAVENOUS | Status: AC
  Filled 2020-10-06: qty 5

## 2020-10-06 MED ORDER — SODIUM CHLORIDE 0.9 % IV SOLN
10.0000 mg | Freq: Once | INTRAVENOUS | Status: AC
  Administered 2020-10-06: 10 mg via INTRAVENOUS
  Filled 2020-10-06: qty 10

## 2020-10-06 MED ORDER — POTASSIUM CHLORIDE IN NACL 20-0.9 MEQ/L-% IV SOLN
Freq: Once | INTRAVENOUS | Status: AC
  Filled 2020-10-06: qty 1000

## 2020-10-06 MED ORDER — HEPARIN SOD (PORK) LOCK FLUSH 100 UNIT/ML IV SOLN
500.0000 [IU] | Freq: Once | INTRAVENOUS | Status: AC
  Administered 2020-10-06: 500 [IU] via INTRAVENOUS
  Filled 2020-10-06: qty 5

## 2020-10-06 MED ORDER — PALONOSETRON HCL INJECTION 0.25 MG/5ML
0.2500 mg | Freq: Once | INTRAVENOUS | Status: AC
  Administered 2020-10-06: 0.25 mg via INTRAVENOUS
  Filled 2020-10-06: qty 5

## 2020-10-06 MED ORDER — MAGNESIUM SULFATE 2 GM/50ML IV SOLN
2.0000 g | Freq: Once | INTRAVENOUS | Status: AC
  Administered 2020-10-06: 2 g via INTRAVENOUS
  Filled 2020-10-06: qty 50

## 2020-10-06 MED ORDER — PEGFILGRASTIM 6 MG/0.6ML ~~LOC~~ PSKT
6.0000 mg | PREFILLED_SYRINGE | Freq: Once | SUBCUTANEOUS | Status: AC
  Administered 2020-10-06: 6 mg via SUBCUTANEOUS
  Filled 2020-10-06: qty 0.6

## 2020-10-06 MED ORDER — GEMCITABINE HCL CHEMO INJECTION 1 GM/26.3ML
2000.0000 mg | Freq: Once | INTRAVENOUS | Status: AC
  Administered 2020-10-06: 2000 mg via INTRAVENOUS
  Filled 2020-10-06: qty 52.6

## 2020-10-06 MED ORDER — SODIUM CHLORIDE 0.9 % IV SOLN
25.0000 mg/m2 | Freq: Once | INTRAVENOUS | Status: AC
  Administered 2020-10-06: 53 mg via INTRAVENOUS
  Filled 2020-10-06: qty 53

## 2020-10-06 MED ORDER — SODIUM CHLORIDE 0.9 % IV SOLN
Freq: Once | INTRAVENOUS | Status: AC
  Filled 2020-10-06: qty 250

## 2020-10-06 NOTE — Progress Notes (Signed)
0914- ANC: 800. MD, Dr. Rogue Bussing, notified, aware, and reviewed today's lab results. Per MD order: proceed with scheduled Gemzar and Cisplatin treatment today; patient is also to receive Neulasta On-Pro today.

## 2020-10-20 ENCOUNTER — Encounter: Payer: Self-pay | Admitting: Internal Medicine

## 2020-10-20 ENCOUNTER — Inpatient Hospital Stay: Payer: Medicare Other

## 2020-10-20 ENCOUNTER — Inpatient Hospital Stay (HOSPITAL_BASED_OUTPATIENT_CLINIC_OR_DEPARTMENT_OTHER): Payer: Medicare Other | Admitting: Internal Medicine

## 2020-10-20 VITALS — BP 125/74 | HR 96 | Temp 98.2°F | Resp 16 | Ht 66.0 in | Wt 214.0 lb

## 2020-10-20 DIAGNOSIS — C23 Malignant neoplasm of gallbladder: Secondary | ICD-10-CM

## 2020-10-20 DIAGNOSIS — Z5111 Encounter for antineoplastic chemotherapy: Secondary | ICD-10-CM | POA: Diagnosis not present

## 2020-10-20 LAB — CBC WITH DIFFERENTIAL/PLATELET
Abs Immature Granulocytes: 0.62 10*3/uL — ABNORMAL HIGH (ref 0.00–0.07)
Basophils Absolute: 0.1 10*3/uL (ref 0.0–0.1)
Basophils Relative: 1 %
Eosinophils Absolute: 0.1 10*3/uL (ref 0.0–0.5)
Eosinophils Relative: 1 %
HCT: 30.3 % — ABNORMAL LOW (ref 36.0–46.0)
Hemoglobin: 9.6 g/dL — ABNORMAL LOW (ref 12.0–15.0)
Immature Granulocytes: 5 %
Lymphocytes Relative: 26 %
Lymphs Abs: 3.7 10*3/uL (ref 0.7–4.0)
MCH: 28.8 pg (ref 26.0–34.0)
MCHC: 31.7 g/dL (ref 30.0–36.0)
MCV: 91 fL (ref 80.0–100.0)
Monocytes Absolute: 1.6 10*3/uL — ABNORMAL HIGH (ref 0.1–1.0)
Monocytes Relative: 12 %
Neutro Abs: 7.8 10*3/uL — ABNORMAL HIGH (ref 1.7–7.7)
Neutrophils Relative %: 55 %
Platelets: 260 10*3/uL (ref 150–400)
RBC: 3.33 MIL/uL — ABNORMAL LOW (ref 3.87–5.11)
RDW: 21.4 % — ABNORMAL HIGH (ref 11.5–15.5)
WBC: 13.9 10*3/uL — ABNORMAL HIGH (ref 4.0–10.5)
nRBC: 0 % (ref 0.0–0.2)

## 2020-10-20 LAB — COMPREHENSIVE METABOLIC PANEL
ALT: 12 U/L (ref 0–44)
AST: 20 U/L (ref 15–41)
Albumin: 4.1 g/dL (ref 3.5–5.0)
Alkaline Phosphatase: 77 U/L (ref 38–126)
Anion gap: 10 (ref 5–15)
BUN: 19 mg/dL (ref 8–23)
CO2: 26 mmol/L (ref 22–32)
Calcium: 9.1 mg/dL (ref 8.9–10.3)
Chloride: 105 mmol/L (ref 98–111)
Creatinine, Ser: 0.77 mg/dL (ref 0.44–1.00)
GFR, Estimated: >60 mL/min (ref >60)
Glucose, Bld: 134 mg/dL — ABNORMAL HIGH (ref 70–99)
Potassium: 4.1 mmol/L (ref 3.5–5.1)
Sodium: 141 mmol/L (ref 135–145)
Total Bilirubin: 0.5 mg/dL (ref 0.3–1.2)
Total Protein: 7 g/dL (ref 6.5–8.1)

## 2020-10-20 LAB — MAGNESIUM: Magnesium: 2 mg/dL (ref 1.7–2.4)

## 2020-10-20 MED ORDER — PALONOSETRON HCL INJECTION 0.25 MG/5ML
0.2500 mg | Freq: Once | INTRAVENOUS | Status: AC
  Administered 2020-10-20: 0.25 mg via INTRAVENOUS
  Filled 2020-10-20: qty 5

## 2020-10-20 MED ORDER — SODIUM CHLORIDE 0.9% FLUSH
10.0000 mL | INTRAVENOUS | Status: DC | PRN
  Administered 2020-10-20: 10 mL via INTRAVENOUS
  Filled 2020-10-20: qty 10

## 2020-10-20 MED ORDER — HEPARIN SOD (PORK) LOCK FLUSH 100 UNIT/ML IV SOLN
500.0000 [IU] | Freq: Once | INTRAVENOUS | Status: AC
  Administered 2020-10-20: 500 [IU] via INTRAVENOUS
  Filled 2020-10-20: qty 5

## 2020-10-20 MED ORDER — POTASSIUM CHLORIDE IN NACL 20-0.9 MEQ/L-% IV SOLN
Freq: Once | INTRAVENOUS | Status: AC
  Filled 2020-10-20: qty 1000

## 2020-10-20 MED ORDER — SODIUM CHLORIDE 0.9 % IV SOLN
Freq: Once | INTRAVENOUS | Status: AC
  Filled 2020-10-20: qty 250

## 2020-10-20 MED ORDER — SODIUM CHLORIDE 0.9 % IV SOLN
25.0000 mg/m2 | Freq: Once | INTRAVENOUS | Status: AC
  Administered 2020-10-20: 53 mg via INTRAVENOUS
  Filled 2020-10-20: qty 53

## 2020-10-20 MED ORDER — SODIUM CHLORIDE 0.9 % IV SOLN
2000.0000 mg | Freq: Once | INTRAVENOUS | Status: AC
  Administered 2020-10-20: 2000 mg via INTRAVENOUS
  Filled 2020-10-20: qty 52.6

## 2020-10-20 MED ORDER — SODIUM CHLORIDE 0.9 % IV SOLN
10.0000 mg | Freq: Once | INTRAVENOUS | Status: AC
  Administered 2020-10-20: 10 mg via INTRAVENOUS
  Filled 2020-10-20: qty 10

## 2020-10-20 MED ORDER — MAGNESIUM SULFATE 2 GM/50ML IV SOLN
2.0000 g | Freq: Once | INTRAVENOUS | Status: AC
  Administered 2020-10-20: 2 g via INTRAVENOUS
  Filled 2020-10-20: qty 50

## 2020-10-20 MED ORDER — SODIUM CHLORIDE 0.9 % IV SOLN
150.0000 mg | Freq: Once | INTRAVENOUS | Status: AC
  Administered 2020-10-20: 150 mg via INTRAVENOUS
  Filled 2020-10-20: qty 150

## 2020-10-20 NOTE — Progress Notes (Signed)
[place] CONSULT NOTE  Patient Care Team: Revelo, Elyse Jarvis, MD as PCP - General (Family Medicine) Rico Junker, RN as Registered Nurse Christene Lye, MD (General Surgery) Clent Jacks, RN as Oncology Nurse Navigator  CHIEF COMPLAINTS/PURPOSE OF CONSULTATION: GALLBLADDER CANCER   #  Oncology History Overview Note  10/25- GALLBLADDER CANCER- [Dr.Cannon]/Dr.Byerly- laproscopic cholecystectomy- pT Category: pT3; pN1' POSITIVE for PNI; Histologic Type: Adenocarcinoma, biliary type  Histologic Grade: G3, poorly differentiated  Tumor Size: Greatest dimension: 2.2 cm  Tumor Extent: Tumor directly invades the liver  Lymphovascular Invasion: Present pre-treatement   Tumor Site: Fundus MARGINS  Margin Status for Invasive Carcinoma:      Margin Involved by Invasive Carcinoma: Liver parenchymal / hepatic  Bed; Number of Lymph Nodes with Tumor: 1       Number of Lymph Nodes Examined: 1    # October 25th 2021- Incidental gallbladder ADENOCA-s/p acute cholecystectomy [Dr.Cannon]. Stage III- T3N1; liver margin positive.  CT chest negative for any metastatic disease.  MRI abdomen with and without contrast-residual disease noted gallbladder bed; no evidence of distant metastases or lymphadenopathy. NOv 3rd 2021-CA 19-9 > 2000;     # NEOADJUVANT CHEMO-12/15-  Gem-cis [d1 & d-8 q 21 days] ; DEC 2021- Ca-19-03-4321.   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  female incidental pathologic gallbladder cancer stage III- currently on neoadjuvant chemotherapy- Gem-Cis is here a follow up.  Patient denies any constipation.  Denies any diarrhea.  Appetite is good.  No weight loss.  No nausea no vomiting.  Denies any tingling numbness in the extremities or hearing difficulty.  Review of Systems  Constitutional: Negative for chills, diaphoresis, fever and malaise/fatigue.  HENT: Negative for nosebleeds and sore throat.   Eyes: Negative for double vision.  Respiratory: Negative for cough, hemoptysis, sputum production, shortness of breath and wheezing.   Cardiovascular: Negative for chest pain, palpitations, orthopnea and leg swelling.  Gastrointestinal: Positive for constipation. Negative for abdominal pain, blood in stool, diarrhea, heartburn, melena, nausea and vomiting.  Genitourinary: Negative for dysuria, frequency and urgency.  Musculoskeletal: Negative for back pain and joint pain.  Skin: Negative.  Negative for itching and rash.  Neurological: Negative for dizziness, tingling, focal weakness, weakness and headaches.  Endo/Heme/Allergies: Does not bruise/bleed easily.  Psychiatric/Behavioral: Negative for depression. The patient is not nervous/anxious and does not have insomnia.      MEDICAL HISTORY:  Past Medical History:  Diagnosis Date  . Cancer ([place]) 05/17/2020   gallbladder  . Diabetes mellitus without complication ([place])   . Hypertension     SURGICAL HISTORY: Past Surgical History:  Procedure Laterality Date  . ABDOMINAL HYSTERECTOMY    . APPENDECTOMY  2004  . BREAST BIOPSY Left 2011  . BREAST BIOPSY Right 08/26/13  .  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/28/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  . CHOLECYSTECTOMY N/A 05/17/2020   Procedure: LAPAROSCOPIC CHOLECYSTECTOMY;  Surgeon: Duanne Guess, MD;  Location: [facility] ORS;   Service: General;  Laterality: N/A;  . COLONOSCOPY WITH PROPOFOL N/A 01/31/2018   Procedure: COLONOSCOPY WITH PROPOFOL;  Surgeon: Wyline Mood, MD;  Location: [facility] ENDOSCOPY;  Service: Gastroenterology;  Laterality: N/A;  . PORTACATH PLACEMENT Right 07/02/2020   Procedure: INSERTION PORT-A-CATH, chemotherapy;  Surgeon: Duanne Guess, MD;  Location: [facility] ORS;  Service: General;  Laterality: Right;    SOCIAL HISTORY: Social History   Socioeconomic History  . Marital status: Single    Spouse name: Not on file  . Number of children: Not on file  . Years of education: Not on file  . Highest education level: Not on file  Occupational History  . Occupation: special needs worker  Tobacco Use  . Smoking status: Never Smoker  . Smokeless tobacco: Never Used  Vaping Use  . Vaping Use: Never used  Substance and Sexual Activity  . Alcohol use: Yes    Comment: beer on weekend  . Drug use: No  . Sexual activity: Not Currently  Other Topics Concern  . Not on file  Social History Narrative   Lives in [place]; with son. 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 Never smoked; beer/iqour over weekends.    Social Determinants of Health   Financial Resource Strain: Not on file  Food Insecurity: Not on file  Transportation Needs: Not on file  Physical Activity: Not on file  Stress: Not on file  Social Connections: Not on file  Intimate Partner Violence: Not on file    FAMILY HISTORY: Family History  Problem Relation Age of Onset  . Pancreatic cancer Mother        AT 53 years  . Breast cancer Sister        appx-55 years  . Pancreatic cancer Maternal Grandmother     ALLERGIES:  is allergic to seasonal ic [cholestatin].  MEDICATIONS:  Current Outpatient Medications  Medication Sig Dispense Refill  . acetaminophen (TYLENOL) 500 MG tablet Take 500-1,000 mg by mouth every 6 (six) hours as needed for moderate pain or headache.    . Chlorpheniramine Maleate (ALLERGY  RELIEF PO) Take 1 tablet by mouth daily as needed (allergies).    . CINNAMON PO Take 1 tablet by mouth daily.    Marland Kitchen docusate sodium (COLACE) 100 MG capsule Take 100 mg by mouth daily as needed for mild constipation.    Marland Kitchen GARLIC PO Take 1 tablet by mouth daily.    Marland Kitchen glipiZIDE (GLUCOTROL) 5 MG tablet Take 1 tablet (5 mg total) by mouth daily before breakfast. 30 tablet 1  . lidocaine-prilocaine (EMLA) cream Apply 1 application topically as needed. Apply on the port 30-45 mins prior to port access 30 g 1  . losartan (COZAAR) 25 MG tablet Take 25 mg by mouth every morning.     . Multiple Vitamins-Minerals (MULTIVITAMIN WITH MINERALS) tablet Take 1 tablet by mouth daily.    . polyethylene glycol (MIRALAX / GLYCOLAX) 17 g packet Take 17 g by mouth daily.    . potassium chloride SA (KLOR-CON) 20 MEQ tablet 1 pill twice a day 60 tablet 3  . pravastatin (PRAVACHOL) 10 MG tablet Take 10 mg by mouth at bedtime.    . ondansetron (ZOFRAN) 8 MG tablet One pill every 8 hours as needed for nausea/vomitting. (Patient not taking: Reported on 10/20/2020) 40 tablet 1  . prochlorperazine (COMPAZINE) 10 MG  tablet Take 1 tablet (10 mg total) by mouth every 6 (six) hours as needed for nausea or vomiting. (Patient not taking: Reported on 10/20/2020) 40 tablet 1   No current facility-administered medications for this visit.   Facility-Administered Medications Ordered in Other Visits  Medication Dose Route Frequency Provider Last Rate Last Admin  . CISplatin (PLATINOL) 53 mg in sodium chloride 0.9 % 250 mL chemo infusion  25 mg/m2 (Treatment Plan Recorded) Intravenous Once Cammie Sickle, MD      . dexamethasone (DECADRON) 10 mg in sodium chloride 0.9 % 50 mL IVPB  10 mg Intravenous Once Charlaine Dalton R, MD      . fosaprepitant (EMEND) 150 mg in sodium chloride 0.9 % 145 mL IVPB  150 mg Intravenous Once Charlaine Dalton R, MD      . gemcitabine (GEMZAR) 2,000 mg in sodium chloride 0.9 % 250 mL chemo infusion   2,000 mg Intravenous Once Charlaine Dalton R, MD      . heparin lock flush 100 unit/mL  500 Units Intravenous Once Charlaine Dalton R, MD      . magnesium sulfate IVPB 2 g 50 mL  2 g Intravenous Once Cammie Sickle, MD 50 mL/hr at 10/20/20 0955 2 g at 10/20/20 0955  . palonosetron (ALOXI) injection 0.25 mg  0.25 mg Intravenous Once Charlaine Dalton R, MD      . sodium chloride flush (NS) 0.9 % injection 10 mL  10 mL Intravenous PRN Cammie Sickle, MD   10 mL at 10/20/20 0816      .  PHYSICAL EXAMINATION: ECOG PERFORMANCE STATUS: 0 - Asymptomatic  Vitals:   10/20/20 0825  BP: 125/74  Pulse: 96  Resp: 16  Temp: 98.2 F (36.8 C)  SpO2: 100%   Filed Weights   10/20/20 0825  Weight: 214 lb (97.1 kg)    Physical Exam HENT:     Head: Normocephalic and atraumatic.     Mouth/Throat:     Pharynx: No oropharyngeal exudate.  Eyes:     Pupils: Pupils are equal, round, and reactive to light.  Cardiovascular:     Rate and Rhythm: Normal rate and regular rhythm.  Pulmonary:     Effort: Pulmonary effort is normal. No respiratory distress.     Breath sounds: Normal breath sounds. No wheezing.  Abdominal:     General: Bowel sounds are normal. There is no distension.     Palpations: Abdomen is soft. There is no mass.     Tenderness: There is no abdominal tenderness. There is no guarding or rebound.  Musculoskeletal:        General: No tenderness. Normal range of motion.     Cervical back: Normal range of motion and neck supple.  Skin:    General: Skin is warm.  Neurological:     Mental Status: She is alert and oriented to person, place, and time.  Psychiatric:        Mood and Affect: Affect normal.      LABORATORY DATA:  I have reviewed the data as listed Lab Results  Component Value Date   WBC 13.9 (H) 10/20/2020   HGB 9.6 (L) 10/20/2020   HCT 30.3 (L) 10/20/2020   MCV 91.0 10/20/2020   PLT 260 10/20/2020   Recent Labs    09/29/20 0821  10/06/20 0817 10/20/20 0816  NA 140 139 141  K 3.4* 4.2 4.1  CL 104 104 105  CO2 _0 GLUCOSE 177* 143* 134*  BUN _0 CREATININE 0.65 0.75 0.77  CALCIUM 8.7* 9.0 9.1  GFRNONAA >60 >60 >60  PROT 7.0 7.1 7.0  ALBUMIN 3.9 4.1 4.1  AST _1 ALT _2 ALKPHOS 72 56 77  BILITOT 0.5 0.6 0.5    RADIOGRAPHIC STUDIES: I have personally reviewed the radiological images as listed and agreed with the findings in the report. No results found.  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 On NEO-ADJ chemo- Gem-Cis STABLE. March 2022- ca-27-29- 23/WNL.   # Proceed with cycle#6; day-1. Labs today reviewed;  acceptable for treatment today.  Discussed that we will plan total of 6 cycles of neoadjuvant chemotherapy. Will order MRI prior to next visit.  Again reminded the patient to contact Dr. Marlowe Aschoff office for an appointment after the MRI is done.  # Constipation: STABLE;  miralax BID; increased fluid intake.   # Blood glucose- STABLE; Fasting- 134; [started on glipizide 5 mg/day; continue.   # HTN-  continue with cozaar. BP check at home; bring a log.   # Hypokalemia-- sec to cisplatin- improved; continue Kdur 20 meQ once a day.  # DISPOSITION: # Chemo today; NO KCL  # as planned 1 weeks; labs- cbc/cmp/mag;-chemo Gem-Cis; onpro; -possible kcl 20 meq  # follow up in 3 weeks- MD; labs- cbc/cmp;ca-19-9;NO chemoo; possible KCL; MRI prior-Dr.B  -Dr.B    All questions were answered. The patient knows to call the clinic with any problems, questions or concerns.   Cammie Sickle, MD 10/20/2020 10:02 AM

## 2020-10-20 NOTE — Assessment & Plan Note (Addendum)
#  Incidental gallbladder adenocarcinoma- Stage III- T3N1; DEC 2021- ca-19-9- 4322. On NEO-ADJ chemo- Gem-Cis STABLE. March 2022- ca-27-29- 23/WNL.   # Proceed with cycle#6; day-1. Labs today reviewed;  acceptable for treatment today.  Discussed that we will plan total of 6 cycles of neoadjuvant chemotherapy. Will order MRI prior to next visit.  Again reminded the patient to contact Dr. Marlowe Aschoff office for an appointment after the MRI is done.  # Constipation: STABLE;  miralax BID; increased fluid intake.   # Blood glucose- STABLE; Fasting- 134; [started on glipizide 5 mg/day; continue.   # HTN-  continue with cozaar. BP check at home; bring a log.   # Hypokalemia-- sec to cisplatin- improved; continue Kdur 20 meQ once a day.  # DISPOSITION: # Chemo today; NO KCL  # as planned 1 weeks; labs- cbc/cmp/mag;-chemo Gem-Cis; onpro; -possible kcl 20 meq  # follow up in 3 weeks- MD; labs- cbc/cmp;ca-19-9;NO chemoo; possible KCL; MRI prior-Dr.B  -Dr.B

## 2020-10-20 NOTE — Progress Notes (Signed)
Pt states she will be out of potassium next week. Would like to know if she will need a refill of it.

## 2020-10-20 NOTE — Patient Instructions (Signed)
#  Please make an appointment with Dr.Byerly-after the MRI is done [most likely in the week of 18th or 25 April]  #Take potassium pill once a day  #Take diabetes medication as ordered.

## 2020-10-21 LAB — CANCER ANTIGEN 19-9: CA 19-9: 19 U/mL (ref 0–35)

## 2020-10-27 ENCOUNTER — Inpatient Hospital Stay: Payer: Medicare Other

## 2020-10-27 ENCOUNTER — Inpatient Hospital Stay: Payer: Medicare Other | Attending: Oncology

## 2020-10-27 VITALS — BP 128/65 | HR 90 | Temp 97.1°F | Resp 18 | Wt 215.2 lb

## 2020-10-27 DIAGNOSIS — E876 Hypokalemia: Secondary | ICD-10-CM | POA: Insufficient documentation

## 2020-10-27 DIAGNOSIS — C23 Malignant neoplasm of gallbladder: Secondary | ICD-10-CM | POA: Insufficient documentation

## 2020-10-27 DIAGNOSIS — Z5111 Encounter for antineoplastic chemotherapy: Secondary | ICD-10-CM | POA: Diagnosis present

## 2020-10-27 DIAGNOSIS — C787 Secondary malignant neoplasm of liver and intrahepatic bile duct: Secondary | ICD-10-CM | POA: Insufficient documentation

## 2020-10-27 LAB — CBC WITH DIFFERENTIAL/PLATELET
Abs Immature Granulocytes: 0.01 10*3/uL (ref 0.00–0.07)
Basophils Absolute: 0.1 10*3/uL (ref 0.0–0.1)
Basophils Relative: 2 %
Eosinophils Absolute: 0 10*3/uL (ref 0.0–0.5)
Eosinophils Relative: 1 %
HCT: 28.2 % — ABNORMAL LOW (ref 36.0–46.0)
Hemoglobin: 9 g/dL — ABNORMAL LOW (ref 12.0–15.0)
Immature Granulocytes: 0 %
Lymphocytes Relative: 60 %
Lymphs Abs: 2.3 10*3/uL (ref 0.7–4.0)
MCH: 29.1 pg (ref 26.0–34.0)
MCHC: 31.9 g/dL (ref 30.0–36.0)
MCV: 91.3 fL (ref 80.0–100.0)
Monocytes Absolute: 0.6 10*3/uL (ref 0.1–1.0)
Monocytes Relative: 14 %
Neutro Abs: 0.9 10*3/uL — ABNORMAL LOW (ref 1.7–7.7)
Neutrophils Relative %: 23 %
Platelets: 308 10*3/uL (ref 150–400)
RBC: 3.09 MIL/uL — ABNORMAL LOW (ref 3.87–5.11)
RDW: 18.7 % — ABNORMAL HIGH (ref 11.5–15.5)
WBC: 3.8 10*3/uL — ABNORMAL LOW (ref 4.0–10.5)
nRBC: 0 % (ref 0.0–0.2)

## 2020-10-27 LAB — COMPREHENSIVE METABOLIC PANEL
ALT: 14 U/L (ref 0–44)
AST: 18 U/L (ref 15–41)
Albumin: 3.9 g/dL (ref 3.5–5.0)
Alkaline Phosphatase: 56 U/L (ref 38–126)
Anion gap: 8 (ref 5–15)
BUN: 16 mg/dL (ref 8–23)
CO2: 25 mmol/L (ref 22–32)
Calcium: 8.9 mg/dL (ref 8.9–10.3)
Chloride: 106 mmol/L (ref 98–111)
Creatinine, Ser: 0.66 mg/dL (ref 0.44–1.00)
GFR, Estimated: >60 mL/min (ref >60)
Glucose, Bld: 158 mg/dL — ABNORMAL HIGH (ref 70–99)
Potassium: 3.9 mmol/L (ref 3.5–5.1)
Sodium: 139 mmol/L (ref 135–145)
Total Bilirubin: 0.4 mg/dL (ref 0.3–1.2)
Total Protein: 7 g/dL (ref 6.5–8.1)

## 2020-10-27 LAB — MAGNESIUM: Magnesium: 1.8 mg/dL (ref 1.7–2.4)

## 2020-10-27 MED ORDER — POTASSIUM CHLORIDE IN NACL 20-0.9 MEQ/L-% IV SOLN
Freq: Once | INTRAVENOUS | Status: AC
  Filled 2020-10-27: qty 1000

## 2020-10-27 MED ORDER — SODIUM CHLORIDE 0.9 % IV SOLN
150.0000 mg | Freq: Once | INTRAVENOUS | Status: AC
  Administered 2020-10-27: 150 mg via INTRAVENOUS
  Filled 2020-10-27: qty 150

## 2020-10-27 MED ORDER — PALONOSETRON HCL INJECTION 0.25 MG/5ML
0.2500 mg | Freq: Once | INTRAVENOUS | Status: AC
  Administered 2020-10-27: 0.25 mg via INTRAVENOUS
  Filled 2020-10-27: qty 5

## 2020-10-27 MED ORDER — SODIUM CHLORIDE 0.9 % IV SOLN
10.0000 mg | Freq: Once | INTRAVENOUS | Status: AC
  Administered 2020-10-27: 10 mg via INTRAVENOUS
  Filled 2020-10-27: qty 10

## 2020-10-27 MED ORDER — MAGNESIUM SULFATE 2 GM/50ML IV SOLN
2.0000 g | Freq: Once | INTRAVENOUS | Status: AC
  Administered 2020-10-27: 2 g via INTRAVENOUS
  Filled 2020-10-27: qty 50

## 2020-10-27 MED ORDER — SODIUM CHLORIDE 0.9 % IV SOLN
2000.0000 mg | Freq: Once | INTRAVENOUS | Status: AC
  Administered 2020-10-27: 2000 mg via INTRAVENOUS
  Filled 2020-10-27: qty 52.6

## 2020-10-27 MED ORDER — SODIUM CHLORIDE 0.9 % IV SOLN
25.0000 mg/m2 | Freq: Once | INTRAVENOUS | Status: AC
  Administered 2020-10-27: 53 mg via INTRAVENOUS
  Filled 2020-10-27: qty 53

## 2020-10-27 MED ORDER — PEGFILGRASTIM 6 MG/0.6ML ~~LOC~~ PSKT
6.0000 mg | PREFILLED_SYRINGE | Freq: Once | SUBCUTANEOUS | Status: AC
  Administered 2020-10-27: 6 mg via SUBCUTANEOUS
  Filled 2020-10-27: qty 0.6

## 2020-10-27 MED ORDER — SODIUM CHLORIDE 0.9 % IV SOLN
Freq: Once | INTRAVENOUS | Status: AC
Start: 2020-10-27 — End: 2020-10-27
  Filled 2020-10-27: qty 250

## 2020-10-27 MED ORDER — HEPARIN SOD (PORK) LOCK FLUSH 100 UNIT/ML IV SOLN
INTRAVENOUS | Status: AC
  Filled 2020-10-27: qty 5

## 2020-10-27 MED ORDER — HEPARIN SOD (PORK) LOCK FLUSH 100 UNIT/ML IV SOLN
500.0000 [IU] | Freq: Once | INTRAVENOUS | Status: AC | PRN
  Administered 2020-10-27: 500 [IU]
  Filled 2020-10-27: qty 5

## 2020-10-27 NOTE — Progress Notes (Signed)
MD ok with labs today to proceed with tx

## 2020-11-03 ENCOUNTER — Ambulatory Visit
Admission: RE | Admit: 2020-11-03 | Discharge: 2020-11-03 | Disposition: A | Discharge status: Home / Self Care | Payer: Medicare Other | Source: Ambulatory Visit | Attending: Internal Medicine | Admitting: Internal Medicine

## 2020-11-03 ENCOUNTER — Other Ambulatory Visit: Payer: Self-pay

## 2020-11-03 DIAGNOSIS — C23 Malignant neoplasm of gallbladder: Secondary | ICD-10-CM

## 2020-11-03 IMAGING — MR MR ABDOMEN WO/W CM
18 of 19 series · 45 of 48 positions shown · IV contrast (gadavist)
Comparison: [DATE]

CLINICAL DATA: Adenocarcinoma of the gallbladder. Evaluate
treatment response. Status post chemotherapy. Cholecystectomy.

EXAM:
MRI ABDOMEN WITHOUT AND WITH CONTRAST
TECHNIQUE: Multiplanar multisequence MR imaging of the abdomen was performed
both before and after the administration of intravenous contrast.
CONTRAST:  9mL GADAVIST GADOBUTROL 1 MMOL/ML IV SOLN

[Series 3: T2 · coronal · 6.0mm · 1.19mm/px · 2 of 32 slices shown (1 of 2)]
[im 1/32]
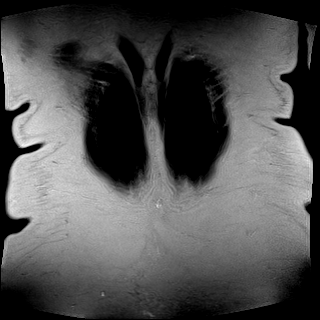
[im 32/32]
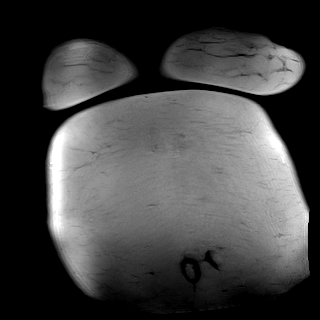

[Series 4: T2 · axial · 6.0mm · 1.19mm/px · z∈[-67,+155]mm · 2 of 32 slices shown (2 of 2)]
[im 1/32]
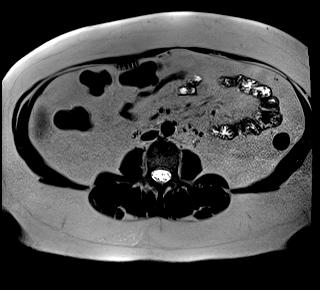
[im 32/32]
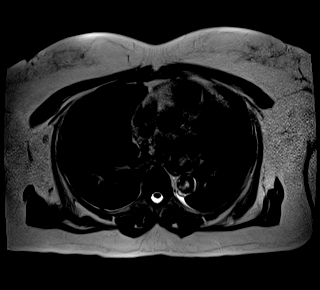

[Series 6: T2 fat-sat · axial · 6.0mm · 1.19mm/px · z∈[-68,+169]mm · 2 of 34 slices shown]
[im 1/34]
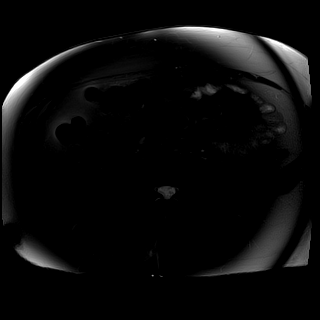
[im 34/34]
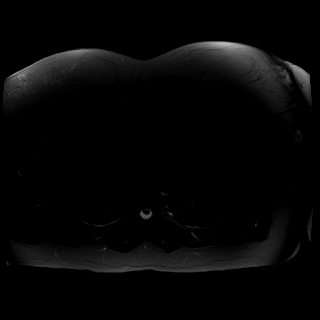

[Series 7: ax dwi_tracew · axial · 6.0mm · 1.42mm/px · z∈[-68,+169]mm · 5 of 102 slices shown]
[im 1/102]
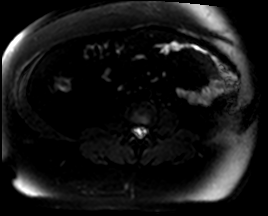
[im 26/102]
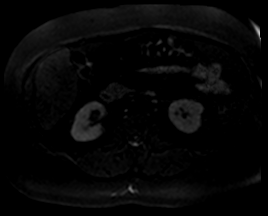
[im 51/102]
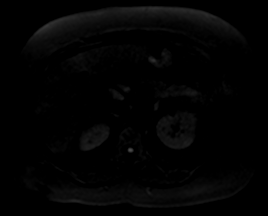
[im 76/102]
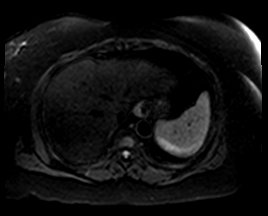
[im 102/102]
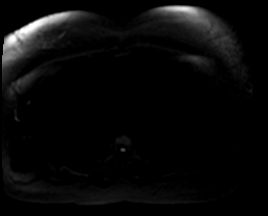

[Series 8: ax dwi_adc · axial · 6.0mm · 1.42mm/px · 1 of 34 slices shown]
[im 1/34]
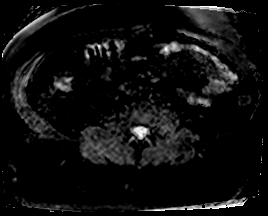

[Series 9: T1 · axial · 6.0mm · 0.74mm/px · 1 of 32 slices shown (1 of 2)]
[im 1/32]
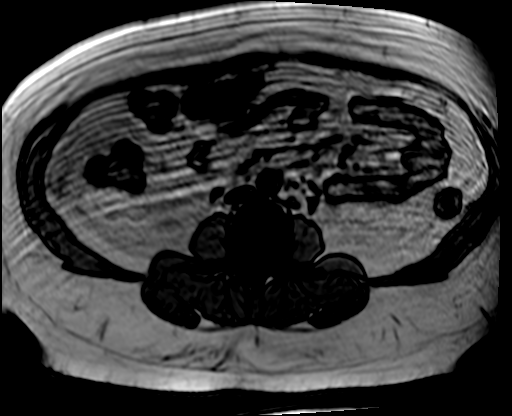

[Series 10: bSSFP · axial · 6.0mm · 0.74mm/px · 1 of 32 slices shown]
[im 1/32]
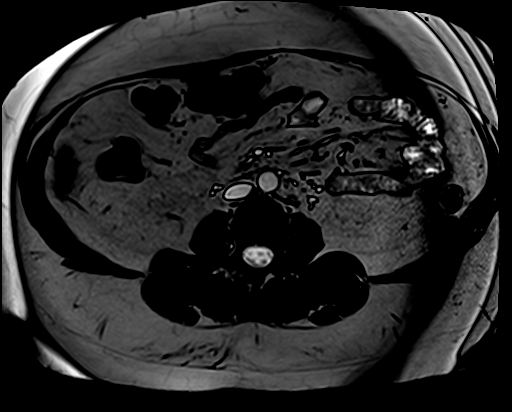

[Series 11: T1 dynamic fat-sat · axial · non-contrast · 3.0mm · 1.19mm/px · z∈[-68,+169]mm · 3 of 80 slices shown (1 of 5)]
[im 1/80]
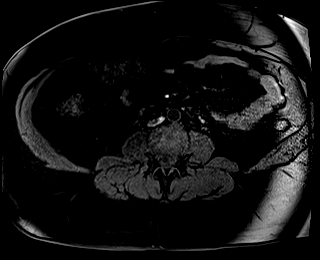
[im 40/80]
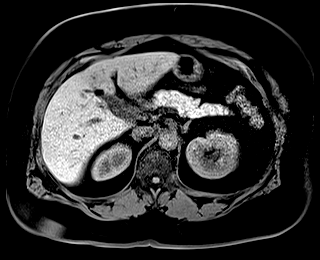
[im 80/80]
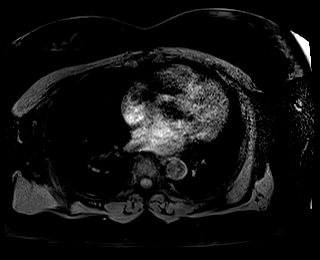

[Series 15: T1 dynamic fat-sat post-contrast · axial · 3.0mm · 1.19mm/px · z∈[-68,+169]mm · 3 of 80 slices shown (1 of 4)]
[im 1/80]
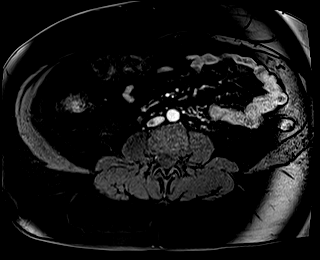
[im 40/80]
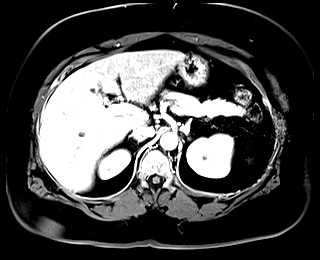
[im 80/80]
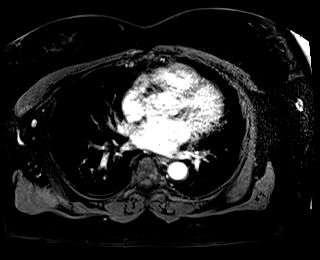

[Series 16: T1 dynamic fat-sat · axial · 3.0mm · 1.19mm/px · z∈[-68,+169]mm · 3 of 80 slices shown (2 of 5)]
[im 1/80]
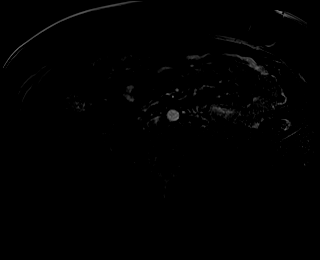
[im 40/80]
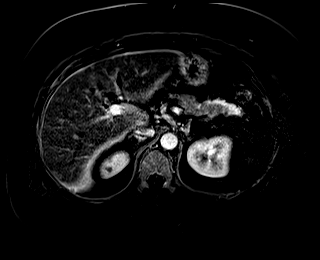
[im 80/80]
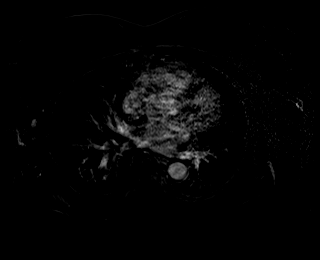

[Series 17: T1 dynamic fat-sat post-contrast · axial · 3.0mm · 1.19mm/px · z∈[-68,+169]mm · 3 of 80 slices shown (2 of 4)]
[im 1/80]
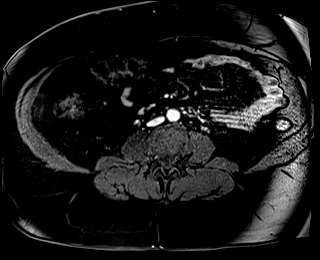
[im 40/80]
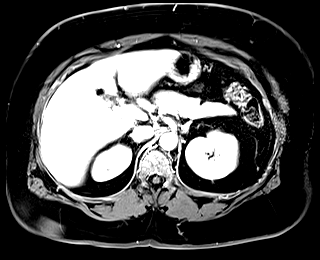
[im 80/80]
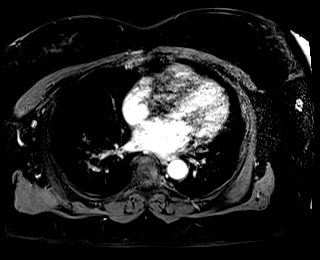

[Series 18: T1 dynamic fat-sat · axial · 3.0mm · 1.19mm/px · z∈[-68,+169]mm · 3 of 80 slices shown (3 of 5)]
[im 1/80]
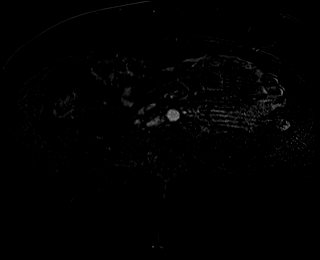
[im 40/80]
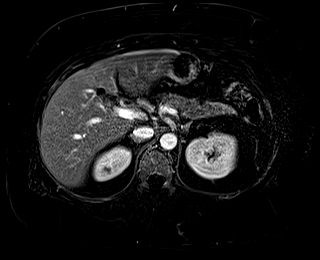
[im 80/80]
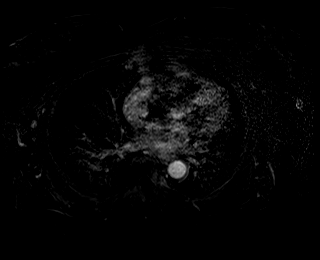

[Series 19: T1 dynamic fat-sat post-contrast · axial · 3.0mm · 1.19mm/px · z∈[-68,+169]mm · 3 of 80 slices shown (3 of 4)]
[im 1/80]
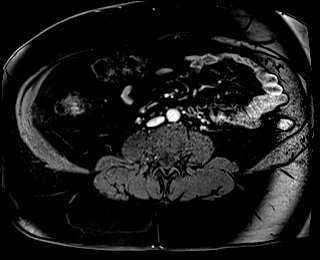
[im 40/80]
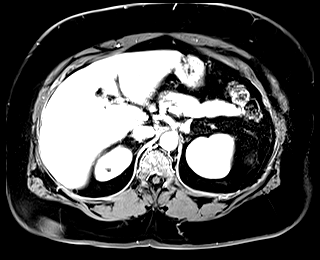
[im 80/80]
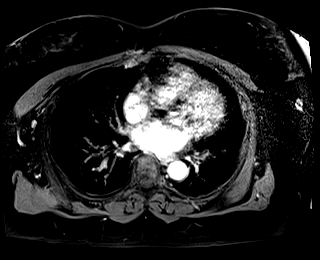

[Series 20: T1 dynamic fat-sat · axial · 3.0mm · 1.19mm/px · z∈[-68,+169]mm · 3 of 80 slices shown (4 of 5)]
[im 1/80]
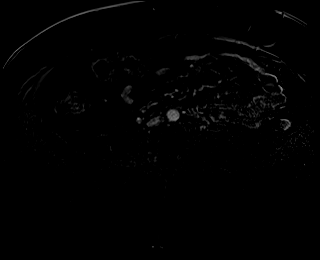
[im 40/80]
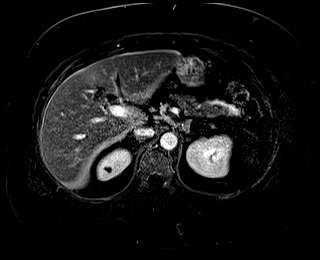
[im 80/80]
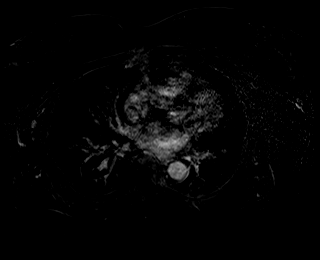

[Series 21: T1 dynamic post-contrast · coronal · 3.0mm · 1.31mm/px · 3 of 72 slices shown]
[im 1/72]
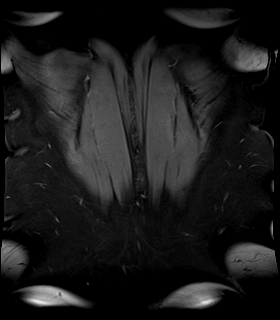
[im 36/72]
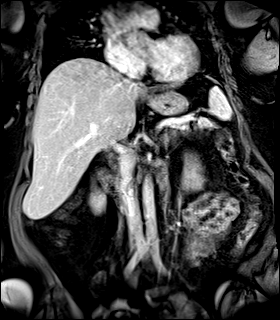
[im 72/72]
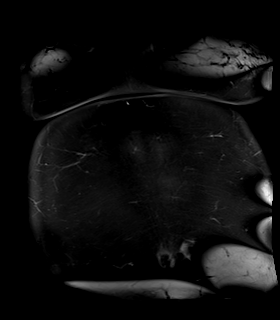

[Series 22: T1 dynamic fat-sat post-contrast · axial · 3.0mm · 1.19mm/px · z∈[-68,+169]mm · 3 of 80 slices shown (4 of 4)]
[im 1/80]
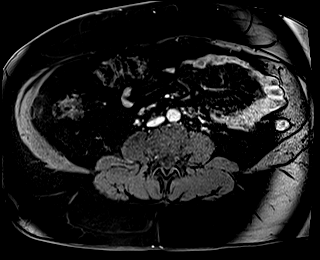
[im 40/80]
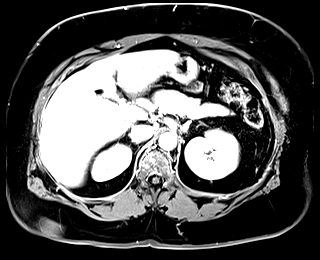
[im 80/80]
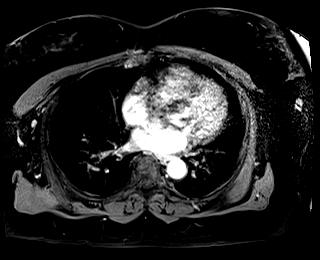

[Series 23: T1 dynamic fat-sat · axial · 3.0mm · 1.19mm/px · z∈[-68,+169]mm · 3 of 80 slices shown (5 of 5)]
[im 1/80]
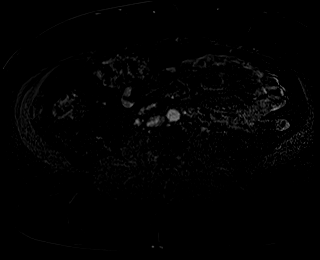
[im 40/80]
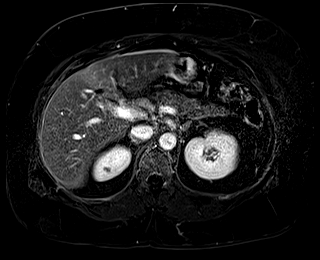
[im 80/80]
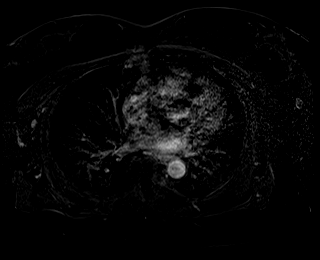

[Series 1032: T1 · axial · 6.0mm · 0.74mm/px · 1 of 32 slices shown (2 of 2)]
[im 1/32]
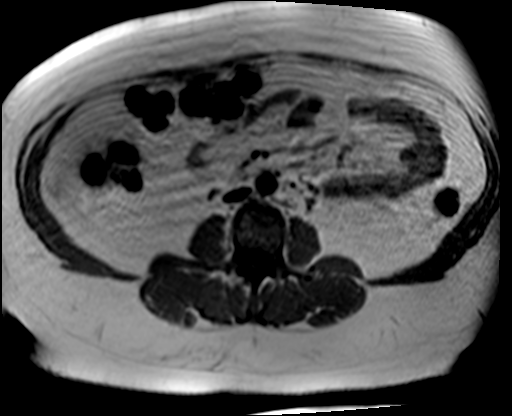

[45 of 48 positions shown; findings below may reference images not displayed]

FINDINGS: Lower chest: Mild cardiomegaly, without pericardial or pleural
effusion. Tiny hiatal hernia.

Hepatobiliary: Cholecystectomy. Previously described postoperative
fluid collection has resolved. The suspicious pericholecystic liver
lesions have also resolved. Again identified is T2 hypointensity
along the inferior right hepatic capsule on [DATE], likely
representing dropped stones.

No biliary duct dilatation or choledocholithiasis.

Pancreas:  Normal, without mass or ductal dilatation.

Spleen:  Normal in size, without focal abnormality.

Adrenals/Urinary Tract: Normal adrenal glands. Bilateral renal
cysts.

Stomach/Bowel: Tiny hiatal hernia.  Normal abdominal bowel loops.

Vascular/Lymphatic: Normal caliber of the aorta and branch vessels.
Prominent porta hepatis nodes are again identified. Example
portacaval node at 1.2 cm on [DATE], similar and favored to be
reactive.

Other: No ascites. The previously described perihepatic soft tissue
thickening has resolved. The enhancement along the right lateral
rectus musculature has resolved.

Musculoskeletal: No acute osseous abnormality.
IMPRESSION: 1. Status post cholecystectomy. Resolution of postoperative fluid
collection, without biliary duct dilatation or choledocholithiasis.
2. No evidence of metastatic disease. The previously described
pericholecystic liver lesions have resolved. These may have
represented treated metastatic disease or have been secondary to
postoperative edema.
3.  Tiny hiatal hernia.
4. Dropped stones along the right hepatic capsule, as before.

## 2020-11-03 MED ORDER — GADOBUTROL 1 MMOL/ML IV SOLN
9.0000 mL | Freq: Once | INTRAVENOUS | Status: AC | PRN
  Administered 2020-11-03: 9 mL via INTRAVENOUS

## 2020-11-05 ENCOUNTER — Other Ambulatory Visit: Payer: Self-pay | Admitting: Internal Medicine

## 2020-11-05 NOTE — Telephone Encounter (Signed)
Please advise on refill.

## 2020-11-10 ENCOUNTER — Inpatient Hospital Stay: Payer: Medicare Other

## 2020-11-10 ENCOUNTER — Inpatient Hospital Stay: Payer: Medicare Other | Admitting: Internal Medicine

## 2020-11-10 ENCOUNTER — Encounter: Payer: Self-pay | Admitting: Internal Medicine

## 2020-11-10 DIAGNOSIS — C23 Malignant neoplasm of gallbladder: Secondary | ICD-10-CM

## 2020-11-10 DIAGNOSIS — Z5111 Encounter for antineoplastic chemotherapy: Secondary | ICD-10-CM | POA: Diagnosis not present

## 2020-11-10 LAB — CBC WITH DIFFERENTIAL/PLATELET
Abs Immature Granulocytes: 0.55 10*3/uL — ABNORMAL HIGH (ref 0.00–0.07)
Basophils Absolute: 0.1 10*3/uL (ref 0.0–0.1)
Basophils Relative: 1 %
Eosinophils Absolute: 0.1 10*3/uL (ref 0.0–0.5)
Eosinophils Relative: 1 %
HCT: 27.4 % — ABNORMAL LOW (ref 36.0–46.0)
Hemoglobin: 8.7 g/dL — ABNORMAL LOW (ref 12.0–15.0)
Immature Granulocytes: 4 %
Lymphocytes Relative: 27 %
Lymphs Abs: 3.7 10*3/uL (ref 0.7–4.0)
MCH: 30.5 pg (ref 26.0–34.0)
MCHC: 31.8 g/dL (ref 30.0–36.0)
MCV: 96.1 fL (ref 80.0–100.0)
Monocytes Absolute: 1.5 10*3/uL — ABNORMAL HIGH (ref 0.1–1.0)
Monocytes Relative: 11 %
Neutro Abs: 8 10*3/uL — ABNORMAL HIGH (ref 1.7–7.7)
Neutrophils Relative %: 56 %
Platelets: 222 10*3/uL (ref 150–400)
RBC: 2.85 MIL/uL — ABNORMAL LOW (ref 3.87–5.11)
RDW: 19.9 % — ABNORMAL HIGH (ref 11.5–15.5)
WBC: 13.9 10*3/uL — ABNORMAL HIGH (ref 4.0–10.5)
nRBC: 0.2 % (ref 0.0–0.2)

## 2020-11-10 LAB — COMPREHENSIVE METABOLIC PANEL
ALT: 12 U/L (ref 0–44)
AST: 20 U/L (ref 15–41)
Albumin: 3.9 g/dL (ref 3.5–5.0)
Alkaline Phosphatase: 91 U/L (ref 38–126)
Anion gap: 11 (ref 5–15)
BUN: 15 mg/dL (ref 8–23)
CO2: 24 mmol/L (ref 22–32)
Calcium: 8.8 mg/dL — ABNORMAL LOW (ref 8.9–10.3)
Chloride: 105 mmol/L (ref 98–111)
Creatinine, Ser: 0.83 mg/dL (ref 0.44–1.00)
GFR, Estimated: >60 mL/min (ref >60)
Glucose, Bld: 144 mg/dL — ABNORMAL HIGH (ref 70–99)
Potassium: 3.9 mmol/L (ref 3.5–5.1)
Sodium: 140 mmol/L (ref 135–145)
Total Bilirubin: 0.3 mg/dL (ref 0.3–1.2)
Total Protein: 6.9 g/dL (ref 6.5–8.1)

## 2020-11-10 LAB — MAGNESIUM: Magnesium: 1.9 mg/dL (ref 1.7–2.4)

## 2020-11-10 MED ORDER — SODIUM CHLORIDE 0.9% FLUSH
10.0000 mL | INTRAVENOUS | Status: DC | PRN
  Administered 2020-11-10: 10 mL via INTRAVENOUS
  Filled 2020-11-10: qty 10

## 2020-11-10 MED ORDER — HEPARIN SOD (PORK) LOCK FLUSH 100 UNIT/ML IV SOLN
500.0000 [IU] | Freq: Once | INTRAVENOUS | Status: AC
Start: 2020-11-10 — End: 2020-11-10
  Administered 2020-11-10: 500 [IU] via INTRAVENOUS
  Filled 2020-11-10: qty 5

## 2020-11-10 NOTE — Assessment & Plan Note (Addendum)
#  Incidental gallbladder adenocarcinoma- Stage III- T3N1; DEC 2021- ca-19-9- 4322. On NEO-ADJ chemo- Gem-Cis x6 cycles; April 16th- 2022-  Status post cholecystectomy.  No evidence of any pericholecystic liver lesions; no metastatic disease noted in the liver.  CA 19-9-April 2022-WNL.   #Recommend proceeding with completion surgery; left a message for Dr. Barry Dienes.  Patient has appointment with Dr. Barry Dienes on May 3.  Discussed that patient will need a total of 6 months of chemotherapy [so for approximately 4 months].  Need for radiation to be decided based on final surgical pathology.   # Blood glucose- STABLE; Fasting- 134; [started on glipizide 5 mg/day; continue.   # HTN-  continue with cozaar. BP check at home; bring a log.   # Hypokalemia-- sec to cisplatin- improved; continue Kdur 20 meQ once a day.  # DISPOSITION: # de-access/ No KCL # Follow up TBD- Dr.B  # I reviewed the blood work- with the patient in detail; also reviewed the imaging independently [as summarized above]; and with the patient in detail.

## 2020-11-10 NOTE — Progress Notes (Signed)
[facility] CONSULT NOTE  Patient Care Team: Kristina Orozco, Kristina Jarvis, MD as PCP - General (Family Medicine) Kristina Junker, RN as Registered Nurse Kristina Lye, MD (General Surgery) Kristina Jacks, RN as Oncology Nurse Navigator  CHIEF COMPLAINTS/PURPOSE OF CONSULTATION: GALLBLADDER CANCER   #  Oncology History Overview Note  10/25- GALLBLADDER CANCER- [Dr.Cannon]/Dr.Byerly- laproscopic cholecystectomy- pT Category: pT3; pN1' POSITIVE for PNI; Histologic Type: Adenocarcinoma, biliary type  Histologic Grade: G3, poorly differentiated  Tumor Size: Greatest dimension: 2.2 cm  Tumor Extent: Tumor directly invades the liver  Lymphovascular Invasion: Present pre-treatement   Tumor Site: Fundus MARGINS  Margin Status for Invasive Carcinoma:      Margin Involved by Invasive Carcinoma: Liver parenchymal / hepatic  Bed; Number of Lymph Nodes with Tumor: 1       Number of Lymph Nodes Examined: 1    # October 25th 2021- Incidental gallbladder ADENOCA-s/p acute cholecystectomy [Dr.Cannon]. Stage III- T3N1; liver margin positive.  CT chest negative for any metastatic disease.  MRI abdomen with and without contrast-residual disease noted gallbladder bed; no evidence of distant metastases or lymphadenopathy. NOv 3rd 2021-CA 19-9 > 2000;     # NEOADJUVANT CHEMO-12/15-  Gem-cis [d1 & d-8 q 21 days] ; DEC 2021- Ca-19-03-4321.   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  female incidental pathologic gallbladder cancer stage III- currently s/p 6 cycles of neoadjuvant chemotherapy- Gem-Cis is here a follow up to review the results of the MRI.  Patient denies any blood in stools or black-colored stools.  Denies any tingling or numbness.  Denies any weight loss.  Denies any abdominal pain.  Constipation is improved.  Review of Systems  Constitutional: Negative for chills, diaphoresis, fever and malaise/fatigue.  HENT: Negative for nosebleeds and sore throat.   Eyes: Negative for double vision.  Respiratory: Negative for cough, hemoptysis, sputum production, shortness of breath and wheezing.   Cardiovascular: Negative for chest pain, palpitations, orthopnea and leg swelling.  Gastrointestinal: Positive for constipation. Negative for abdominal pain, blood in stool, diarrhea, heartburn, melena, nausea and vomiting.  Genitourinary: Negative for dysuria, frequency and urgency.  Musculoskeletal: Negative for back pain and joint pain.  Skin: Negative.  Negative for itching and rash.  Neurological: Negative for dizziness, tingling, focal weakness, weakness and headaches.  Endo/Heme/Allergies: Does not bruise/bleed easily.  Psychiatric/Behavioral: Negative for depression. The patient is not nervous/anxious and does not have insomnia.      MEDICAL HISTORY:  Past Medical History:  Diagnosis Date  . Cancer ([place]) 05/17/2020   gallbladder  . Diabetes mellitus without complication ([place])   . Hypertension     SURGICAL HISTORY: Past Surgical History:  Procedure Laterality Date  . ABDOMINAL HYSTERECTOMY    . APPENDECTOMY  2004  . BREAST BIOPSY Left 2011  .  BREAST BIOPSY Right 08/26/13  . 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/28/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  . CHOLECYSTECTOMY N/A 05/17/2020   Procedure: LAPAROSCOPIC CHOLECYSTECTOMY;  Surgeon: Fredirick Maudlin, MD;  Location: [facility] ORS;  Service: General;  Laterality: N/A;  . COLONOSCOPY WITH PROPOFOL N/A 01/31/2018   Procedure: COLONOSCOPY WITH PROPOFOL;  Surgeon: Jonathon Bellows, MD;  Location: [facility] ENDOSCOPY;  Service: Gastroenterology;  Laterality: N/A;  . PORTACATH PLACEMENT Right 07/02/2020   Procedure: INSERTION PORT-A-CATH, chemotherapy;  Surgeon: Fredirick Maudlin, MD;  Location: [facility] ORS;  Service: General;  Laterality: Right;    SOCIAL HISTORY: Social History   Socioeconomic History  . Marital status: Single    Spouse name: Not on file  . Number of children: Not on file  . Years of education: Not on file  . Highest education level: Not on file  Occupational History  . Occupation: special needs worker  Tobacco Use  . Smoking status: Never Smoker  . Smokeless tobacco: Never Used  Vaping Use  . Vaping Use: Never used  Substance and Sexual Activity  . Alcohol use: Yes    Comment: beer on weekend  . Drug use: No  . Sexual activity: Not Currently  Other Topics Concern  . Not on file  Social History Narrative   Lives in [place]; with son. 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 Never smoked; beer/iqour over weekends.    Social Determinants of Health   Financial Resource Strain: Not on file  Food Insecurity: Not on file  Transportation Needs: Not on file  Physical Activity: Not on file  Stress: Not on file  Social Connections: Not on file  Intimate Partner Violence: Not on file    FAMILY HISTORY: Family History  Problem Relation Age of Onset  . Pancreatic cancer Mother        AT 13 years  . Breast cancer Sister        appx-55 years  . Pancreatic cancer Maternal Grandmother     ALLERGIES:  is allergic to seasonal ic [cholestatin].  MEDICATIONS:  Current Outpatient Medications  Medication Sig Dispense Refill  . acetaminophen (TYLENOL) 500 MG tablet Take 500-1,000 mg by mouth every 6 (six) hours as needed for moderate pain or headache.     . Chlorpheniramine Maleate (ALLERGY RELIEF PO) Take 1 tablet by mouth daily as needed (allergies).    . CINNAMON PO Take 1 tablet by mouth daily.    Marland Kitchen docusate sodium (COLACE) 100 MG capsule Take 100 mg by mouth daily as needed for mild constipation.    Marland Kitchen GARLIC PO Take 1 tablet by mouth daily.    Marland Kitchen glipiZIDE (GLUCOTROL) 5 MG tablet TAKE 1 TABLET BY MOUTH ONCE DAILY BEFORE BREAKFAST 30 tablet 0  . lidocaine-prilocaine (EMLA) cream Apply 1 application topically as needed. Apply on the port 30-45 mins prior to port access 30 g 1  . losartan (COZAAR) 25 MG tablet Take 25 mg by mouth every morning.     . Multiple Vitamins-Minerals (MULTIVITAMIN WITH MINERALS) tablet Take 1 tablet by mouth daily.    . ondansetron (ZOFRAN) 8 MG tablet One pill every 8 hours as needed for nausea/vomitting. 40 tablet 1  . polyethylene glycol (MIRALAX / GLYCOLAX) 17 g packet Take 17 g by mouth daily.    . potassium chloride SA (KLOR-CON) 20 MEQ tablet 1 pill twice a day 60 tablet 3  . pravastatin (PRAVACHOL) 10 MG tablet Take 10 mg by mouth at bedtime.    . prochlorperazine (COMPAZINE) 10 MG tablet Take  1 tablet (10 mg total) by mouth every 6 (six) hours as needed for nausea or vomiting. 40 tablet 1   No current facility-administered medications for this visit.   Facility-Administered Medications Ordered in Other Visits  Medication Dose Route Frequency Provider Last Rate Last Admin  . heparin lock flush 100 unit/mL  500 Units Intravenous Once Charlaine Dalton R, MD      . sodium chloride flush (NS) 0.9 % injection 10 mL  10 mL Intravenous PRN Cammie Sickle, MD   10 mL at 11/10/20 0907      .  PHYSICAL EXAMINATION: ECOG PERFORMANCE STATUS: 0 - Asymptomatic  Vitals:   11/10/20 0927  BP: (!) 145/76  Pulse: 99  Resp: 16  Temp: 98.1 F (36.7 C)  SpO2: 100%   Filed Weights   11/10/20 0927  Weight: 217 lb (98.4 kg)    Physical Exam HENT:     Head: Normocephalic and atraumatic.      Mouth/Throat:     Pharynx: No oropharyngeal exudate.  Eyes:     Pupils: Pupils are equal, round, and reactive to light.  Cardiovascular:     Rate and Rhythm: Normal rate and regular rhythm.  Pulmonary:     Effort: Pulmonary effort is normal. No respiratory distress.     Breath sounds: Normal breath sounds. No wheezing.  Abdominal:     General: Bowel sounds are normal. There is no distension.     Palpations: Abdomen is soft. There is no mass.     Tenderness: There is no abdominal tenderness. There is no guarding or rebound.  Musculoskeletal:        General: No tenderness. Normal range of motion.     Cervical back: Normal range of motion and neck supple.  Skin:    General: Skin is warm.  Neurological:     Mental Status: She is alert and oriented to person, place, and time.  Psychiatric:        Mood and Affect: Affect normal.      LABORATORY DATA:  I have reviewed the data as listed Lab Results  Component Value Date   WBC 13.9 (H) 11/10/2020   HGB 8.7 (L) 11/10/2020   HCT 27.4 (L) 11/10/2020   MCV 96.1 11/10/2020   PLT 222 11/10/2020   Recent Labs    10/20/20 0816 10/27/20 0816 11/10/20 0907  NA 141 139 140  K 4.1 3.9 3.9  CL 105 106 105  CO2 _0 GLUCOSE 134* 158* 144*  BUN _1 CREATININE 0.77 0.66 0.83  CALCIUM 9.1 8.9 8.8*  GFRNONAA >60 >60 >60  PROT 7.0 7.0 6.9  ALBUMIN 4.1 3.9 3.9  AST _2 ALT _3 ALKPHOS 77 56 91  BILITOT 0.5 0.4 0.3    RADIOGRAPHIC STUDIES: I have personally reviewed the radiological images as listed and agreed with the findings in the report. MR Abdomen W Wo Contrast  Result Date: 11/04/2020 CLINICAL DATA:  Adenocarcinoma of the gallbladder. Evaluate treatment response. Status post chemotherapy. Cholecystectomy. EXAM: MRI ABDOMEN WITHOUT AND WITH CONTRAST TECHNIQUE: Multiplanar multisequence MR imaging of the abdomen was performed both before and after the administration of intravenous contrast. CONTRAST:  43m  GADAVIST GADOBUTROL 1 MMOL/ML IV SOLN COMPARISON:  06/09/2020 FINDINGS: Lower chest: Mild cardiomegaly, without pericardial or pleural effusion. Tiny hiatal hernia. Hepatobiliary: Cholecystectomy. Previously described postoperative fluid collection has resolved. The suspicious pericholecystic liver lesions have also resolved. Again identified is T2 hypointensity along  the inferior right hepatic capsule on 31/6, likely representing dropped stones. No biliary duct dilatation or choledocholithiasis. Pancreas:  Normal, without mass or ductal dilatation. Spleen:  Normal in size, without focal abnormality. Adrenals/Urinary Tract: Normal adrenal glands. Bilateral renal cysts. Stomach/Bowel: Tiny hiatal hernia.  Normal abdominal bowel loops. Vascular/Lymphatic: Normal caliber of the aorta and branch vessels. Prominent porta hepatis nodes are again identified. Example portacaval node at 1.2 cm on 21/6, similar and favored to be reactive. Other: No ascites. The previously described perihepatic soft tissue thickening has resolved. The enhancement along the right lateral rectus musculature has resolved. Musculoskeletal: No acute osseous abnormality. IMPRESSION: 1. Status post cholecystectomy. Resolution of postoperative fluid collection, without biliary duct dilatation or choledocholithiasis. 2. No evidence of metastatic disease. The previously described pericholecystic liver lesions have resolved. These may have represented treated metastatic disease or have been secondary to postoperative edema. 3.  Tiny hiatal hernia. 4. Dropped stones along the right hepatic capsule, as before. Electronically Signed   By: Abigail Miyamoto M.D.   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 On NEO-ADJ chemo- Gem-Cis x6 cycles; April 16th- 2022-  Status post cholecystectomy.  No evidence of any pericholecystic liver  lesions; no metastatic disease noted in the liver.  CA 19-9-April 2022-WNL.   #Recommend proceeding with completion surgery; left a message for Dr. Barry Dienes.  Patient has appointment with Dr. Barry Dienes on May 3.  Discussed that patient will need a total of 6 months of chemotherapy [so for approximately 4 months].  Need for radiation to be decided based on final surgical pathology.   # Blood glucose- STABLE; Fasting- 134; [started on glipizide 5 mg/day; continue.   # HTN-  continue with cozaar. BP check at home; bring a log.   # Hypokalemia-- sec to cisplatin- improved; continue Kdur 20 meQ once a day.  # DISPOSITION: # de-access/ No KCL # Follow up TBD- Dr.B      All questions were answered. The patient knows to call the clinic with any problems, questions or concerns.   Cammie Sickle, MD 11/10/2020 10:06 AM

## 2020-11-11 LAB — CANCER ANTIGEN 19-9: CA 19-9: 18 U/mL (ref 0–35)

## 2020-11-17 ENCOUNTER — Other Ambulatory Visit: Payer: Self-pay

## 2020-11-17 NOTE — Progress Notes (Signed)
The proposed treatment discussed in conference is for discussion purposes only and is not a binding recommendation.  The patients have not been physically examined, or presented with their treatment options.  Therefore, final treatment plans cannot be decided.   

## 2020-11-23 ENCOUNTER — Other Ambulatory Visit: Payer: Self-pay | Admitting: General Surgery

## 2020-12-05 ENCOUNTER — Other Ambulatory Visit: Payer: Self-pay | Admitting: Nurse Practitioner

## 2020-12-17 ENCOUNTER — Other Ambulatory Visit: Payer: Self-pay | Admitting: Nurse Practitioner

## 2020-12-19 ENCOUNTER — Encounter: Payer: Self-pay | Admitting: Internal Medicine

## 2020-12-28 NOTE — Pre-Procedure Instructions (Signed)
Surgical Instructions    Your procedure is scheduled on Tuesday June 14th.  Report to [facility] Main Entrance "A" at 05:30 A.M., then check in with the Admitting office.  Call this number if you have problems the morning of surgery:  [phone]   If you have any questions prior to your surgery date call [phone]: Open Monday-Friday 8am-4pm    Remember:  Do not eat after midnight the night before your surgery  You may drink clear liquids until 04:30 a.m. the morning of your surgery.   Clear liquids allowed are: Water, Non-Citrus Juices (without pulp), Carbonated Beverages, Clear Tea, Black Coffee Only, and Gatorade    Take these medicines the morning of surgery with A SIP OF WATER   ondansetron (ZOFRAN)- If needed  pravastatin (PRAVACHOL) WHAT DO I DO ABOUT MY DIABETES MEDICATION?   Marland Kitchen Do not take oral diabetes medicines (pills) the morning of surgery. Do not take glipiZIDE (GLUCOTROL) the morning of surgery.   . The day of surgery, do not take other diabetes injectables, including Byetta (exenatide), Bydureon (exenatide ER), Victoza (liraglutide), or Trulicity (dulaglutide).  . If your CBG is greater than 220 mg/dL, you may take  of your sliding scale (correction) dose of insulin.   HOW TO MANAGE YOUR DIABETES BEFORE AND AFTER SURGERY  Why is it important to control my blood sugar before and after surgery? . Improving blood sugar levels before and after surgery helps healing and can limit problems. . A way of improving blood sugar control is eating a healthy diet by: o  Eating less sugar and carbohydrates o  Increasing activity/exercise o  Talking with your doctor about reaching your blood sugar goals . High blood sugars (greater than 180 mg/dL) can raise your risk of infections and slow your recovery, so you will need to focus on controlling your diabetes during the weeks before surgery. . Make sure that the doctor who takes care of your diabetes knows about your  planned surgery including the date and location.  How do I manage my blood sugar before surgery? . Check your blood sugar at least 4 times a day, starting 2 days before surgery, to make sure that the level is not too high or low.  . Check your blood sugar the morning of your surgery when you wake up and every 2 hours until you get to the Short Stay unit.  o If your blood sugar is less than 70 mg/dL, you will need to treat for low blood sugar: - Do not take insulin. - Treat a low blood sugar (less than 70 mg/dL) with  cup of clear juice (cranberry or apple), 4 glucose tablets, OR glucose gel. - Recheck blood sugar in 15 minutes after treatment (to make sure it is greater than 70 mg/dL). If your blood sugar is not greater than 70 mg/dL on recheck, call [phone] for further instructions. . Report your blood sugar to the short stay nurse when you get to Short Stay.  . If you are admitted to the hospital after surgery: o Your blood sugar will be checked by the staff and you will probably be given insulin after surgery (instead of oral diabetes medicines) to make sure you have good blood sugar levels. o The goal for blood sugar control after surgery is 80-180 mg/dL.     As of today, STOP taking any Aspirin (unless otherwise instructed by your surgeon) Aleve, Naproxen, Ibuprofen, Motrin, Advil, Goody's, BC's, all herbal medications, fish oil, and all vitamins.  Do not wear jewelry or makeup Do not wear lotions, powders, perfumes/colognes, or deodorant. Do not shave 48 hours prior to surgery.  Do not bring valuables to the hospital. DO Not wear nail polish, gel polish, artificial nails, or any other type of covering on natural nails including finger and toenails. If patients have artificial nails, gel coating, etc. that need to be removed by a nail salon please have this removed prior to surgery or surgery may need to be canceled/delayed if the surgeon/ anesthesia feels like the  patient is unable to be adequately monitored.             [facility] is not responsible for any belongings or valuables.  Do NOT Smoke (Tobacco/Vaping) or drink Alcohol 24 hours prior to your procedure If you use a CPAP at night, you may bring all equipment for your overnight stay.   Contacts, glasses, dentures or partials may not be worn into surgery, please bring cases for these belongings   For patients admitted to the hospital, discharge time will be determined by your treatment team.   Patients discharged the day of surgery will not be allowed to drive home, and someone needs to stay with them for 24 hours.    Special instructions:    Oral Hygiene is also important to reduce your risk of infection.  Remember - BRUSH YOUR TEETH THE MORNING OF SURGERY WITH YOUR REGULAR TOOTHPASTE   [facility]- Preparing For Surgery  Before surgery, you can play an important role. Because skin is not sterile, your skin needs to be as free of germs as possible. You can reduce the number of germs on your skin by washing with CHG (chlorahexidine gluconate) Soap before surgery.  CHG is an antiseptic cleaner which kills germs and bonds with the skin to continue killing germs even after washing.     Please do not use if you have an allergy to CHG or antibacterial soaps. If your skin becomes reddened/irritated stop using the CHG.  Do not shave (including legs and underarms) for at least 48 hours prior to first CHG shower. It is OK to shave your face.  Please follow these instructions carefully.    1.  Shower the NIGHT BEFORE SURGERY and the MORNING OF SURGERY with CHG Soap.   If you chose to wash your hair, wash your hair first as usual with your normal shampoo. After you shampoo, rinse your hair and body thoroughly to remove the shampoo.  Then [employer] and genitals (private parts) with your normal soap and rinse thoroughly to remove soap.  2. After that Use CHG Soap as you would any other liquid  soap. You can apply CHG directly to the skin and wash gently with a scrungie or a clean washcloth.   3. Apply the CHG Soap to your body ONLY FROM THE NECK DOWN.  Do not use on open wounds or open sores. Avoid contact with your eyes, ears, mouth and genitals (private parts). Wash Face and genitals (private parts)  with your normal soap.   4. Wash thoroughly, paying special attention to the area where your surgery will be performed.  5. Thoroughly rinse your body with warm water from the neck down.  6. DO NOT shower/wash with your normal soap after using and rinsing off the CHG Soap.  7. Pat yourself dry with a CLEAN TOWEL.  8. Wear CLEAN PAJAMAS to bed the night before surgery  9. Place CLEAN SHEETS on your bed the night before  your surgery  10. DO NOT SLEEP WITH PETS.   Day of Surgery:  Take a shower with CHG soap. Wear Clean/Comfortable clothing the morning of surgery Do not apply any deodorants/lotions.   Remember to brush your teeth WITH YOUR REGULAR TOOTHPASTE.   Please read over the following fact sheets that you were given.

## 2020-12-29 ENCOUNTER — Encounter (HOSPITAL_COMMUNITY)
Admission: RE | Admit: 2020-12-29 | Discharge: 2020-12-29 | Disposition: A | Discharge status: Home / Self Care | Payer: Medicare Other | Source: Ambulatory Visit | Attending: General Surgery | Admitting: General Surgery

## 2020-12-29 ENCOUNTER — Encounter (HOSPITAL_COMMUNITY): Payer: Self-pay

## 2020-12-29 ENCOUNTER — Other Ambulatory Visit: Payer: Self-pay

## 2020-12-29 DIAGNOSIS — Z01812 Encounter for preprocedural laboratory examination: Secondary | ICD-10-CM | POA: Insufficient documentation

## 2020-12-29 HISTORY — DX: Personal history of antineoplastic chemotherapy: Z92.21

## 2020-12-29 LAB — CBC WITH DIFFERENTIAL/PLATELET
Abs Immature Granulocytes: 0.01 10*3/uL (ref 0.00–0.07)
Basophils Absolute: 0.1 10*3/uL (ref 0.0–0.1)
Basophils Relative: 1 %
Eosinophils Absolute: 0.2 10*3/uL (ref 0.0–0.5)
Eosinophils Relative: 3 %
HCT: 37.4 % (ref 36.0–46.0)
Hemoglobin: 11.4 g/dL — ABNORMAL LOW (ref 12.0–15.0)
Immature Granulocytes: 0 %
Lymphocytes Relative: 28 %
Lymphs Abs: 2 10*3/uL (ref 0.7–4.0)
MCH: 28.1 pg (ref 26.0–34.0)
MCHC: 30.5 g/dL (ref 30.0–36.0)
MCV: 92.3 fL (ref 80.0–100.0)
Monocytes Absolute: 1 10*3/uL (ref 0.1–1.0)
Monocytes Relative: 14 %
Neutro Abs: 4 10*3/uL (ref 1.7–7.7)
Neutrophils Relative %: 54 %
Platelets: 257 10*3/uL (ref 150–400)
RBC: 4.05 MIL/uL (ref 3.87–5.11)
RDW: 14.3 % (ref 11.5–15.5)
WBC: 7.3 10*3/uL (ref 4.0–10.5)
nRBC: 0 % (ref 0.0–0.2)

## 2020-12-29 LAB — COMPREHENSIVE METABOLIC PANEL
ALT: 20 U/L (ref 0–44)
AST: 22 U/L (ref 15–41)
Albumin: 3.8 g/dL (ref 3.5–5.0)
Alkaline Phosphatase: 57 U/L (ref 38–126)
Anion gap: 11 (ref 5–15)
BUN: 11 mg/dL (ref 8–23)
CO2: 29 mmol/L (ref 22–32)
Calcium: 9.5 mg/dL (ref 8.9–10.3)
Chloride: 101 mmol/L (ref 98–111)
Creatinine, Ser: 0.76 mg/dL (ref 0.44–1.00)
GFR, Estimated: >60 mL/min (ref >60)
Glucose, Bld: 95 mg/dL (ref 70–99)
Potassium: 4 mmol/L (ref 3.5–5.1)
Sodium: 141 mmol/L (ref 135–145)
Total Bilirubin: 0.6 mg/dL (ref 0.3–1.2)
Total Protein: 7.2 g/dL (ref 6.5–8.1)

## 2020-12-29 LAB — PROTIME-INR
INR: 1 (ref 0.8–1.2)
Prothrombin Time: 13.2 seconds (ref 11.4–15.2)

## 2020-12-29 LAB — PREPARE RBC (CROSSMATCH)

## 2020-12-29 LAB — GLUCOSE, CAPILLARY: Glucose-Capillary: 110 mg/dL — ABNORMAL HIGH (ref 70–99)

## 2020-12-29 NOTE — Progress Notes (Signed)
PCP - Dr. Alene Mires Cardiologist - patient denies  PPM/ICD - n/a Device Orders -  Rep Notified -   Chest x-ray - 07/02/20 EKG - 05/18/20 Stress Test - patient denies ECHO - patient denies Cardiac Cath - patient denies  Sleep Study - patient denies CPAP -   Fasting Blood Sugar - 120-130 Checks Blood Sugar 3 times a week  Blood Thinner Instructions: Aspirin Instructions:  ERAS Protcol - clears until 0430 mornign of surgery PRE-SURGERY Ensure or G2-  None ordered  COVID TEST- scheduled for 12/31/20 at [facility]   Anesthesia review: n/a  Patient denies shortness of breath, fever, cough and chest pain at PAT appointment   All instructions explained to the patient, with a verbal understanding of the material. Patient agrees to go over the instructions while at home for a better understanding. Patient also instructed to self quarantine after being tested for COVID-19. The opportunity to ask questions was provided.

## 2020-12-30 LAB — HEMOGLOBIN A1C
Hgb A1c MFr Bld: 5.5 % (ref 4.8–5.6)
Mean Plasma Glucose: 111 mg/dL

## 2020-12-31 ENCOUNTER — Other Ambulatory Visit (HOSPITAL_COMMUNITY): Payer: Medicare Other

## 2020-12-31 ENCOUNTER — Other Ambulatory Visit
Admission: RE | Admit: 2020-12-31 | Discharge: 2020-12-31 | Disposition: A | Discharge status: Home / Self Care | Payer: Medicare Other | Source: Ambulatory Visit | Attending: General Surgery | Admitting: General Surgery

## 2020-12-31 ENCOUNTER — Other Ambulatory Visit: Payer: Self-pay

## 2020-12-31 DIAGNOSIS — Z01812 Encounter for preprocedural laboratory examination: Secondary | ICD-10-CM | POA: Insufficient documentation

## 2020-12-31 DIAGNOSIS — Z20822 Contact with and (suspected) exposure to covid-19: Secondary | ICD-10-CM | POA: Diagnosis not present

## 2020-12-31 LAB — SARS CORONAVIRUS 2 (TAT 6-24 HRS): SARS Coronavirus 2: NEGATIVE

## 2021-01-03 ENCOUNTER — Other Ambulatory Visit: Payer: Self-pay | Admitting: Pediatrics

## 2021-01-03 DIAGNOSIS — Z13820 Encounter for screening for osteoporosis: Secondary | ICD-10-CM

## 2021-01-03 NOTE — Anesthesia Preprocedure Evaluation (Addendum)
Anesthesia Evaluation  Patient identified by MRN, date of birth, ID band Patient awake    Reviewed: Allergy & Precautions, NPO status , Patient's Chart, lab work & pertinent test results  History of Anesthesia Complications Negative for: history of anesthetic complications  Airway Mallampati: II  TM Distance: >3 FB Neck ROM: Full    Dental  (+) Missing,    Pulmonary neg pulmonary ROS,    Pulmonary exam normal        Cardiovascular hypertension, Pt. on medications Normal cardiovascular exam     Neuro/Psych negative neurological ROS  negative psych ROS   GI/Hepatic Neg liver ROS, Gallbladder ca   Endo/Other  diabetes, Type 2, Oral Hypoglycemic Agents  Renal/GU negative Renal ROS  negative genitourinary   Musculoskeletal negative musculoskeletal ROS (+)   Abdominal   Peds  Hematology Hgb 11.4, plt 257k   Anesthesia Other Findings Day of surgery medications reviewed with patient.  Reproductive/Obstetrics negative OB ROS                           Anesthesia Physical Anesthesia Plan  ASA: 3  Anesthesia Plan: General   Post-op Pain Management:    Induction: Intravenous  PONV Risk Score and Plan: 4 or greater and Treatment may vary due to age or medical condition, Ondansetron, Dexamethasone and Midazolam  Airway Management Planned: Oral ETT  Additional Equipment: Arterial line  Intra-op Plan:   Post-operative Plan: Possible Post-op intubation/ventilation  Informed Consent: I have reviewed the patients History and Physical, chart, labs and discussed the procedure including the risks, benefits and alternatives for the proposed anesthesia with the patient or authorized representative who has indicated his/her understanding and acceptance.     Dental advisory given  Plan Discussed with: CRNA  Anesthesia Plan Comments: (Discussed anesthetic plan including PIVx2 (CVC if unable to  obtain large bore IVs), arterial line, and epidural. Patient adamantly refuses epidural despite recommendations of surgeon and myself. Will plan multimodal pain control intraoperatively. Daiva Huge, MD)       Anesthesia Quick Evaluation

## 2021-01-03 NOTE — H&P (Signed)
Kristina Orozco Location: [facility] Patient #: [id number] DOB: 1956-03-13 Single / Language: Cleophus Molt / Race: Black or African American Female   History of Present Illness  The patient is a 65 year old female who presents for a Follow-up for Diagnosis of Cancer. Pt is a 65 yo F referred by Dr. 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  The patient developed sudden onset of severe RUQ and side/back pain around 2 weeks ago.  She went to [facility] and was found to have acute calculous cholecystitis.  She had laparoscopic lysis of adhesions with lap chole 05/17/2020 by Dr. Fredirick Maudlin at [facility].  She had a distended tense gallbladder wtih innumerable stones.  It took around 90 min for dissection to fee the duodenum from the common duct and the liver.  Path came back as pT3 with a positive node of Calot.  She had a chest CT for staging workup that was negative and liver MR that was concerning for possible liver disease vs post op changes in the liver at the gallbladder edge. Her CA 19-9 was over 2000 on 11/3.  She had a sister with breast cancer around age 13 and a mother and maternal grandmother with pancreatic cancer.    The liver MRI did not show any liver masses, but had some equivocal findings like enhancement around the gallbladder fossa and possible right rectus muscular hyperenhancement. Because of the high CA 19-9, the possible disease in the gallbladder fossa and right rectus, and the positive cystic node, she received chemotherapy first.  Repeat MR was negative for enhancement.  She did relatively well with chemo.  She is ready for surgery.  She is getting around well.  CA 19-9 is down to 18.  follow up liver MR 11/04/20 IMPRESSION: 1. Status post cholecystectomy. Resolution of postoperative fluid collection, without biliary duct dilatation or choledocholithiasis. 2. No evidence of metastatic disease. The  previously described pericholecystic liver lesions have resolved. These may have represented treated metastatic disease or have been secondary to postoperative edema. 3.  Tiny hiatal hernia. 4. Dropped stones along the right hepatic capsule, as before.   MR abd 06/10/2020 IMPRESSION: 1. Status post cholecystectomy with complex fluid collection in the operative bed. Considerations remain biloma, seroma, or abscess. This is incompletely imaged on 05/31/2020 chest CT, but decreased since that exam. 2. Pericholecystic foci of hepatic T2 mild hyperintensity and hypoenhancement. Suspicious for metastatic disease given clinical history of gallbladder carcinoma. This can serve as a baseline for follow-up exams. 3. Foci of T2 hypointensity and hypoenhancement along the right hepatic capsule, suspicious for dropped stones. 4. Trace ascites and mild right abdominal peritoneal thickening are likely postoperative. A subtle focus of right rectus muscular hyperenhancement is nonspecific, warranting attention for muscular metastasis. 5. Prominent but not pathologically sized porta hepatis nodes, indeterminate  pathology 05/17/2020 DIAGNOSIS: A. GALLBLADDER; CHOLECYSTECTOMY: - ADENOCARCINOMA OF THE GALLBLADDER. - CARCINOMA IN SITU. - SEE CANCER SUMMARY BELOW. - PERINEURAL INVASION IS PRESENT. - ACUTE ON CHRONIC CHOLECYSTITIS WITH CHOLELITHIASIS. - METASTATIC ADENOCARCINOMA INVOLVES CYSTIC DUCT LYMPH NODE (1/1).  At least T3N1  CT chest 05/31/2020 IMPRESSION: 1. No evidence of metastatic disease in the chest. 2. Surgical clips and a partially imaged fluid attenuation collection in the gallbladder fossa, in keeping with recent postoperative findings of cholecystectomy. This collection is nonspecific and may reflect hematoma, seroma, or biloma. Correlate with forthcoming planned staging imaging of the abdomen and pelvis. 3. Coronary artery disease.  Aortic  Atherosclerosis  (ICD10-I70.0).   RUQ [place] 05/16/2020 IMPRESSION: 1. Findings likely consistent with acute cholecystitis, despite the absence of a positive sonographic Murphy sign. Further correlation with a nuclear medicine hepatobiliary scan is recommended.   Allergies Holli Humbles, CNA; 11/23/2020 9:58 AM) No Known Drug Allergies   [05/31/2020]: Allergies Reconciled    Medication History [employer], CNA; 11/23/2020 9:58 AM) Medications Reconciled  Pravastatin Sodium  (10MG  Tablet, Oral) Active. Losartan Potassium  (25MG  Tablet, Oral) Active. hydroCHLOROthiazide  (25MG  Tablet, Oral) Active. Garlic  (1000MG  Capsule, Oral) Active. Cinnamon  (Oral) Specific strength unknown - Active. Multivitamins  (Oral) Active.    Review of Systems  All other systems negative   Physical Exam  General Mental Status - Alert. General Appearance - Consistent with stated age. Hydration - Well hydrated. Voice - Normal.  Head and Neck Head - normocephalic, atraumatic with no lesions or palpable masses.  Eye Sclera/Conjunctiva - Bilateral - No scleral icterus.  Chest and Lung Exam Chest and lung exam reveals  - quiet, even and easy respiratory effort with no use of accessory muscles. Inspection Chest Wall - Normal. Back - normal.  Breast - Did not examine.  Cardiovascular Cardiovascular examination reveals  - normal pedal pulses bilaterally. Note:  regular rate and rhythm  Abdomen Inspection - Inspection Normal. Palpation/Percussion Palpation and Percussion of the abdomen reveal - Soft, Non Tender, No Rebound tenderness, No Rigidity (guarding) and No hepatosplenomegaly.  Peripheral Vascular Upper Extremity Inspection - Bilateral - Normal - No Clubbing, No Cyanosis, No Edema, Pulses Intact. Lower Extremity Palpation - Edema - Bilateral - No edema - Bilateral.  Neurologic Neurologic evaluation reveals  - alert and oriented x 3 with no impairment of recent or remote memory. Mental Status -  Normal.  Musculoskeletal Global Assessment  - Note:  no gross deformities.  Normal Exam - Left - Upper Extremity Strength Normal and Lower Extremity Strength Normal. Normal Exam - Right - Upper Extremity Strength Normal and Lower Extremity Strength Normal.  Lymphatic Head & Neck  General Head & Neck Lymphatics: Bilateral - Description - Normal. Axillary  General Axillary Region: Bilateral - Description - Normal. Tenderness - Non Tender.    Assessment & Plan  GALLBLADDER CANCER (C23) Impression: Discussed surgery with patient again.  Will plan diagnostic laparoscopy, partial hepatectomy, and portal lymph node dissection.  The surgery was discussed with the patient with diagrams of anatomy. I reviewed the rationale for surgery, possible alternative options, possibility of having to abort the procedure, hospital course, post op restrictions, possible post op complications, possible need for post hospital stay at a nursing home or rehab, and possible death.  The complications can include: This is a very extensive operation and includes complications listed below: Bleeding Infection and possible wound complications such as hernia Damage to adjacent structures Leak of bile from the surface of the liver Possible need for other procedures, such as abscess drains in radiology or endoscopy. Possible prolonged hospital stay MOST PATIENTS' ENERGY LEVEL IS NOT BACK TO NORMAL FOR AT LEAST 4-6 MONTHS. OLDER PATIENTS MAY FEEL WEAK FOR LONGER PERIODS OF TIME. Difficulty with eating or post operative nausea (around 30%) Possible early recurrence of cancer Possible complications of your medical problems such as heart disease or arrhythmias. Death (less than 2%)

## 2021-01-04 ENCOUNTER — Telehealth: Payer: Self-pay | Admitting: Internal Medicine

## 2021-01-04 ENCOUNTER — Encounter (HOSPITAL_COMMUNITY): Payer: Self-pay | Admitting: General Surgery

## 2021-01-04 ENCOUNTER — Inpatient Hospital Stay (HOSPITAL_COMMUNITY)
Admission: RE | Admit: 2021-01-04 | Discharge: 2021-01-04 | DRG: 424 | Disposition: A | Discharge status: Home / Self Care | Payer: Medicare Other | Attending: General Surgery | Admitting: General Surgery

## 2021-01-04 ENCOUNTER — Encounter (HOSPITAL_COMMUNITY)
Admission: RE | Disposition: A | Discharge status: Home / Self Care | Payer: Self-pay | Source: Home / Self Care | Attending: General Surgery

## 2021-01-04 ENCOUNTER — Telehealth: Payer: Self-pay | Admitting: *Deleted

## 2021-01-04 ENCOUNTER — Inpatient Hospital Stay (HOSPITAL_COMMUNITY): Payer: Medicare Other | Admitting: Anesthesiology

## 2021-01-04 ENCOUNTER — Inpatient Hospital Stay (HOSPITAL_COMMUNITY): Payer: Medicare Other | Admitting: Physician Assistant

## 2021-01-04 DIAGNOSIS — C23 Malignant neoplasm of gallbladder: Secondary | ICD-10-CM | POA: Diagnosis present

## 2021-01-04 DIAGNOSIS — I10 Essential (primary) hypertension: Secondary | ICD-10-CM | POA: Diagnosis present

## 2021-01-04 DIAGNOSIS — C772 Secondary and unspecified malignant neoplasm of intra-abdominal lymph nodes: Secondary | ICD-10-CM | POA: Diagnosis present

## 2021-01-04 DIAGNOSIS — J302 Other seasonal allergic rhinitis: Secondary | ICD-10-CM | POA: Diagnosis present

## 2021-01-04 DIAGNOSIS — Z79899 Other long term (current) drug therapy: Secondary | ICD-10-CM

## 2021-01-04 DIAGNOSIS — Z9049 Acquired absence of other specified parts of digestive tract: Secondary | ICD-10-CM | POA: Diagnosis not present

## 2021-01-04 DIAGNOSIS — E119 Type 2 diabetes mellitus without complications: Secondary | ICD-10-CM | POA: Diagnosis present

## 2021-01-04 DIAGNOSIS — Z7984 Long term (current) use of oral hypoglycemic drugs: Secondary | ICD-10-CM

## 2021-01-04 DIAGNOSIS — C786 Secondary malignant neoplasm of retroperitoneum and peritoneum: Secondary | ICD-10-CM | POA: Diagnosis present

## 2021-01-04 DIAGNOSIS — Z9221 Personal history of antineoplastic chemotherapy: Secondary | ICD-10-CM

## 2021-01-04 HISTORY — PX: LAPAROSCOPIC LYSIS OF ADHESIONS: SHX5905

## 2021-01-04 HISTORY — PX: LAPAROSCOPY: SHX197

## 2021-01-04 LAB — PREPARE RBC (CROSSMATCH)

## 2021-01-04 LAB — ABO/RH: ABO/RH(D): A POS

## 2021-01-04 LAB — GLUCOSE, CAPILLARY
Glucose-Capillary: 127 mg/dL — ABNORMAL HIGH (ref 70–99)
Glucose-Capillary: 191 mg/dL — ABNORMAL HIGH (ref 70–99)

## 2021-01-04 SURGERY — LAPAROSCOPY, DIAGNOSTIC
Anesthesia: General

## 2021-01-04 MED ORDER — FENTANYL CITRATE (PF) 250 MCG/5ML IJ SOLN
INTRAMUSCULAR | Status: DC | PRN
  Administered 2021-01-04: 100 ug via INTRAVENOUS
  Administered 2021-01-04: 50 ug via INTRAVENOUS

## 2021-01-04 MED ORDER — CHLORHEXIDINE GLUCONATE CLOTH 2 % EX PADS: 6.0000 | MEDICATED_PAD | Freq: Once | CUTANEOUS | Status: DC

## 2021-01-04 MED ORDER — DEXAMETHASONE SODIUM PHOSPHATE 10 MG/ML IJ SOLN
INTRAMUSCULAR | Status: DC | PRN
  Administered 2021-01-04: 5 mg via INTRAVENOUS

## 2021-01-04 MED ORDER — CEFAZOLIN SODIUM-DEXTROSE 2-4 GM/100ML-% IV SOLN
INTRAVENOUS | Status: AC
  Filled 2021-01-04: qty 100

## 2021-01-04 MED ORDER — FENTANYL CITRATE (PF) 250 MCG/5ML IJ SOLN
INTRAMUSCULAR | Status: AC
  Filled 2021-01-04: qty 5

## 2021-01-04 MED ORDER — CHLORHEXIDINE GLUCONATE 0.12 % MT SOLN
OROMUCOSAL | Status: AC
  Administered 2021-01-04: 15 mL via OROMUCOSAL
  Filled 2021-01-04: qty 15

## 2021-01-04 MED ORDER — LACTATED RINGERS IV SOLN: INTRAVENOUS | Status: DC

## 2021-01-04 MED ORDER — BUPIVACAINE-EPINEPHRINE (PF) 0.25% -1:200000 IJ SOLN
INTRAMUSCULAR | Status: AC
  Filled 2021-01-04: qty 30

## 2021-01-04 MED ORDER — CHLORHEXIDINE GLUCONATE 0.12 % MT SOLN: 15.0000 mL | Freq: Once | OROMUCOSAL | Status: AC

## 2021-01-04 MED ORDER — ROCURONIUM BROMIDE 10 MG/ML (PF) SYRINGE
PREFILLED_SYRINGE | INTRAVENOUS | Status: AC
  Filled 2021-01-04: qty 10

## 2021-01-04 MED ORDER — OXYCODONE HCL 5 MG PO TABS: 5.0000 mg | ORAL_TABLET | Freq: Once | ORAL | Status: DC | PRN

## 2021-01-04 MED ORDER — PHENYLEPHRINE HCL-NACL 10-0.9 MG/250ML-% IV SOLN
INTRAVENOUS | Status: DC | PRN
  Administered 2021-01-04: 40 ug/min via INTRAVENOUS

## 2021-01-04 MED ORDER — ROCURONIUM BROMIDE 10 MG/ML (PF) SYRINGE
PREFILLED_SYRINGE | INTRAVENOUS | Status: DC | PRN
  Administered 2021-01-04: 40 mg via INTRAVENOUS
  Administered 2021-01-04: 60 mg via INTRAVENOUS
  Administered 2021-01-04: 30 mg via INTRAVENOUS

## 2021-01-04 MED ORDER — ORAL CARE MOUTH RINSE: 15.0000 mL | Freq: Once | OROMUCOSAL | Status: AC

## 2021-01-04 MED ORDER — ACETAMINOPHEN 500 MG PO TABS
ORAL_TABLET | ORAL | Status: AC
  Administered 2021-01-04: 1000 mg via ORAL
  Filled 2021-01-04: qty 2

## 2021-01-04 MED ORDER — OXYCODONE HCL 5 MG/5ML PO SOLN: 5.0000 mg | Freq: Once | ORAL | Status: DC | PRN

## 2021-01-04 MED ORDER — SODIUM CHLORIDE 0.9% IV SOLUTION: Freq: Once | INTRAVENOUS | Status: DC

## 2021-01-04 MED ORDER — PHENYLEPHRINE HCL (PRESSORS) 10 MG/ML IV SOLN
INTRAVENOUS | Status: DC | PRN
  Administered 2021-01-04: 80 ug via INTRAVENOUS

## 2021-01-04 MED ORDER — SUGAMMADEX SODIUM 200 MG/2ML IV SOLN
INTRAVENOUS | Status: DC | PRN
  Administered 2021-01-04: 200 mg via INTRAVENOUS

## 2021-01-04 MED ORDER — LIDOCAINE HCL (PF) 2 % IJ SOLN
INTRAMUSCULAR | Status: DC | PRN
  Administered 2021-01-04: 100 mg via INTRADERMAL

## 2021-01-04 MED ORDER — PROPOFOL 10 MG/ML IV BOLUS
INTRAVENOUS | Status: DC | PRN
  Administered 2021-01-04: 180 mg via INTRAVENOUS

## 2021-01-04 MED ORDER — ACETAMINOPHEN 500 MG PO TABS: 1000.0000 mg | ORAL_TABLET | ORAL | Status: AC

## 2021-01-04 MED ORDER — LIDOCAINE HCL 1 % IJ SOLN
INTRAMUSCULAR | Status: DC | PRN
  Administered 2021-01-04: 3 mL via SUBCUTANEOUS

## 2021-01-04 MED ORDER — OXYCODONE HCL 5 MG PO TABS: 5.0000 mg | ORAL_TABLET | Freq: Four times a day (QID) | ORAL | 0 refills | Status: DC | PRN

## 2021-01-04 MED ORDER — LIDOCAINE HCL (PF) 1 % IJ SOLN
INTRAMUSCULAR | Status: AC
  Filled 2021-01-04: qty 30

## 2021-01-04 MED ORDER — PROMETHAZINE HCL 25 MG/ML IJ SOLN: 6.2500 mg | INTRAMUSCULAR | Status: DC | PRN

## 2021-01-04 MED ORDER — PROPOFOL 10 MG/ML IV BOLUS
INTRAVENOUS | Status: AC
  Filled 2021-01-04: qty 40

## 2021-01-04 MED ORDER — ONDANSETRON HCL 4 MG/2ML IJ SOLN
INTRAMUSCULAR | Status: AC
  Filled 2021-01-04: qty 2

## 2021-01-04 MED ORDER — KETAMINE HCL 50 MG/5ML IJ SOSY
PREFILLED_SYRINGE | INTRAMUSCULAR | Status: AC
  Filled 2021-01-04: qty 5

## 2021-01-04 MED ORDER — FENTANYL CITRATE (PF) 100 MCG/2ML IJ SOLN: 25.0000 ug | INTRAMUSCULAR | Status: DC | PRN

## 2021-01-04 MED ORDER — MIDAZOLAM HCL 2 MG/2ML IJ SOLN
INTRAMUSCULAR | Status: DC | PRN
  Administered 2021-01-04: 2 mg via INTRAVENOUS

## 2021-01-04 MED ORDER — LACTATED RINGERS IV SOLN: INTRAVENOUS | Status: DC | PRN

## 2021-01-04 MED ORDER — DEXAMETHASONE SODIUM PHOSPHATE 10 MG/ML IJ SOLN
INTRAMUSCULAR | Status: AC
  Filled 2021-01-04: qty 1

## 2021-01-04 MED ORDER — CEFAZOLIN SODIUM-DEXTROSE 2-4 GM/100ML-% IV SOLN
2.0000 g | INTRAVENOUS | Status: AC
  Administered 2021-01-04: 2 g via INTRAVENOUS

## 2021-01-04 MED ORDER — KETAMINE HCL 10 MG/ML IJ SOLN
INTRAMUSCULAR | Status: DC | PRN
  Administered 2021-01-04: 30 mg via INTRAVENOUS
  Administered 2021-01-04: 10 mg via INTRAVENOUS

## 2021-01-04 MED ORDER — MIDAZOLAM HCL 2 MG/2ML IJ SOLN
INTRAMUSCULAR | Status: AC
  Filled 2021-01-04: qty 2

## 2021-01-04 MED ORDER — LIDOCAINE HCL (PF) 2 % IJ SOLN
INTRAMUSCULAR | Status: AC
  Filled 2021-01-04: qty 5

## 2021-01-04 MED ORDER — ONDANSETRON HCL 4 MG/2ML IJ SOLN
INTRAMUSCULAR | Status: DC | PRN
  Administered 2021-01-04: 4 mg via INTRAVENOUS

## 2021-01-04 SURGICAL SUPPLY — 102 items
ADH SKN CLS APL DERMABOND .7 (GAUZE/BANDAGES/DRESSINGS) ×2
APL PRP STRL LF DISP 70% ISPRP (MISCELLANEOUS) ×2
BAG BILE T-TUBES STRL (MISCELLANEOUS) ×3 IMPLANT
BAG DRN 9.5 2 ADJ BELT ADPR (MISCELLANEOUS) ×2
BAG SPEC RTRVL LRG 6X4 10 (ENDOMECHANICALS) ×4
BIOPATCH RED 1 DISK 7.0 (GAUZE/BANDAGES/DRESSINGS) IMPLANT
BLADE CLIPPER SURG (BLADE) IMPLANT
BLADE SURG 11 STRL SS (BLADE) IMPLANT
BOOT SUTURE AID YELLOW STND (SUTURE) IMPLANT
CANISTER SUCT 3000ML PPV (MISCELLANEOUS) ×3 IMPLANT
CATH KIT ON Q 7.5IN SLV (PAIN MANAGEMENT) IMPLANT
CATH KIT ON-Q SILVERSOAK 5IN (CATHETERS) IMPLANT
CHLORAPREP W/TINT 26 (MISCELLANEOUS) ×3 IMPLANT
CLIP VESOCCLUDE LG 6/CT (CLIP) ×3 IMPLANT
CLIP VESOCCLUDE MED 24/CT (CLIP) ×3 IMPLANT
CLIP VESOCCLUDE MED 6/CT (CLIP) ×3 IMPLANT
CLIP VESOLOCK LG 6/CT PURPLE (CLIP) ×3 IMPLANT
CLIP VESOLOCK MED 6/CT (CLIP) ×3 IMPLANT
CLIP VESOLOCK MED LG 6/CT (CLIP) ×3 IMPLANT
CNTNR URN SCR LID CUP LEK RST (MISCELLANEOUS) ×2 IMPLANT
CONT SPEC 4OZ STRL OR WHT (MISCELLANEOUS) ×3
COUNTER NEEDLE 20 DBL MAG RED (NEEDLE) IMPLANT
COVER SURGICAL LIGHT HANDLE (MISCELLANEOUS) ×3 IMPLANT
COVER WAND RF STERILE (DRAPES) ×3 IMPLANT
DECANTER SPIKE VIAL GLASS SM (MISCELLANEOUS) IMPLANT
DERMABOND ADVANCED (GAUZE/BANDAGES/DRESSINGS) ×1
DERMABOND ADVANCED .7 DNX12 (GAUZE/BANDAGES/DRESSINGS) ×2 IMPLANT
DRAIN CHANNEL 19F RND (DRAIN) ×3 IMPLANT
DRAPE LAPAROSCOPIC ABDOMINAL (DRAPES) ×3 IMPLANT
DRAPE WARM FLUID 44X44 (DRAPES) ×3 IMPLANT
DRSG COVADERM 4X10 (GAUZE/BANDAGES/DRESSINGS) IMPLANT
DRSG COVADERM 4X14 (GAUZE/BANDAGES/DRESSINGS) IMPLANT
DRSG TEGADERM 4X4.75 (GAUZE/BANDAGES/DRESSINGS) IMPLANT
DRSG TELFA 3X8 NADH (GAUZE/BANDAGES/DRESSINGS) ×3 IMPLANT
ELECT BLADE 4.0 EZ CLEAN MEGAD (MISCELLANEOUS) ×3
ELECT BLADE 6.5 EXT (BLADE) ×3 IMPLANT
ELECT PAD DSPR THERM+ ADLT (MISCELLANEOUS) ×3 IMPLANT
ELECT REM PT RETURN 9FT ADLT (ELECTROSURGICAL) ×3
ELECTRODE BLDE 4.0 EZ CLN MEGD (MISCELLANEOUS) ×2 IMPLANT
ELECTRODE REM PT RTRN 9FT ADLT (ELECTROSURGICAL) ×2 IMPLANT
EVACUATOR SILICONE 100CC (DRAIN) IMPLANT
GAUZE 4X4 16PLY RFD (DISPOSABLE) IMPLANT
GEL ULTRASOUND 20GR AQUASONIC (MISCELLANEOUS) IMPLANT
GLOVE BIO SURGEON STRL SZ 6 (GLOVE) ×6 IMPLANT
GLOVE SURG UNDER LTX SZ6.5 (GLOVE) ×3 IMPLANT
GOWN STRL REUS W/ TWL LRG LVL3 (GOWN DISPOSABLE) ×4 IMPLANT
GOWN STRL REUS W/TWL 2XL LVL3 (GOWN DISPOSABLE) ×6 IMPLANT
GOWN STRL REUS W/TWL LRG LVL3 (GOWN DISPOSABLE) ×6
HAND PENCIL TRP OPTION (MISCELLANEOUS) IMPLANT
HANDLE SUCTION POOLE (INSTRUMENTS) ×2 IMPLANT
KIT BASIN OR (CUSTOM PROCEDURE TRAY) ×3 IMPLANT
KIT TURNOVER KIT B (KITS) ×3 IMPLANT
L-HOOK LAP DISP 36CM (ELECTROSURGICAL)
LHOOK LAP DISP 36CM (ELECTROSURGICAL) IMPLANT
LOOP VESSEL MAXI BLUE (MISCELLANEOUS) IMPLANT
LOOP VESSEL MINI RED (MISCELLANEOUS) IMPLANT
NEEDLE BIOPSY 14X6 SOFT TISS (NEEDLE) IMPLANT
NS IRRIG 1000ML POUR BTL (IV SOLUTION) ×6 IMPLANT
PACK GENERAL/GYN (CUSTOM PROCEDURE TRAY) IMPLANT
PAD ARMBOARD 7.5X6 YLW CONV (MISCELLANEOUS) ×6 IMPLANT
PENCIL BUTTON HOLSTER BLD 10FT (ELECTRODE) IMPLANT
PENCIL SMOKE EVACUATOR (MISCELLANEOUS) ×3 IMPLANT
POUCH SPECIMEN RETRIEVAL 10MM (ENDOMECHANICALS) ×6 IMPLANT
RELOAD STAPLER WHITE 60MM (STAPLE) IMPLANT
SCISSORS LAP 5X35 DISP (ENDOMECHANICALS) IMPLANT
SET IRRIG TUBING LAPAROSCOPIC (IRRIGATION / IRRIGATOR) ×3 IMPLANT
SET TUBE SMOKE EVAC HIGH FLOW (TUBING) ×3 IMPLANT
SHEARS FOC LG CVD HARMONIC 17C (MISCELLANEOUS) IMPLANT
SHEARS HARMONIC ACE PLUS 36CM (ENDOMECHANICALS) ×3 IMPLANT
SLEEVE ENDOPATH XCEL 5M (ENDOMECHANICALS) ×3 IMPLANT
SPONGE LAP 18X18 RF (DISPOSABLE) ×3 IMPLANT
STAPLER ECHELON POWERED (MISCELLANEOUS) IMPLANT
STAPLER RELOAD WHITE 60MM (STAPLE)
STAPLER VISISTAT 35W (STAPLE) IMPLANT
SUCTION POOLE HANDLE (INSTRUMENTS) ×3
SUT ETHILON 1 TP 1 60 (SUTURE) IMPLANT
SUT ETHILON 2 0 FS 18 (SUTURE) IMPLANT
SUT MNCRL AB 4-0 PS2 18 (SUTURE) IMPLANT
SUT PDS AB 1 TP1 96 (SUTURE) IMPLANT
SUT PROLENE 3 0 RB 1 (SUTURE) ×3 IMPLANT
SUT PROLENE 3 0 SH 48 (SUTURE) ×3 IMPLANT
SUT PROLENE 3 0 SH DA (SUTURE) IMPLANT
SUT PROLENE 4 0 RB 1 (SUTURE)
SUT PROLENE 4 0 SH DA (SUTURE) ×3 IMPLANT
SUT PROLENE 4-0 RB1 .5 CRCL 36 (SUTURE) IMPLANT
SUT VIC AB 2-0 CTX 27 (SUTURE) IMPLANT
SUT VIC AB 2-0 SH 18 (SUTURE) IMPLANT
SUT VIC AB 3-0 SH 18 (SUTURE) IMPLANT
SUT VIC AB 3-0 SH 8-18 (SUTURE) IMPLANT
SUT VICRYL 0 UR6 27IN ABS (SUTURE) ×3 IMPLANT
SUT VICRYL AB 2 0 TIES (SUTURE) ×3 IMPLANT
SUT VICRYL AB 3 0 TIES (SUTURE) ×3 IMPLANT
TOWEL GREEN STERILE (TOWEL DISPOSABLE) ×3 IMPLANT
TOWEL GREEN STERILE FF (TOWEL DISPOSABLE) ×3 IMPLANT
TRAY FOLEY MTR SLVR 14FR STAT (SET/KITS/TRAYS/PACK) ×3 IMPLANT
TRAY LAPAROSCOPIC MC (CUSTOM PROCEDURE TRAY) ×3 IMPLANT
TROCAR XCEL 12X100 BLDLESS (ENDOMECHANICALS) ×3 IMPLANT
TROCAR XCEL BLUNT TIP 100MML (ENDOMECHANICALS) IMPLANT
TROCAR XCEL NON-BLD 11X100MML (ENDOMECHANICALS) ×3 IMPLANT
TROCAR XCEL NON-BLD 5MMX100MML (ENDOMECHANICALS) ×6 IMPLANT
TUNNELER SHEATH ON-Q 16GX12 DP (PAIN MANAGEMENT) IMPLANT
WARMER LAPAROSCOPE (MISCELLANEOUS) ×3 IMPLANT

## 2021-01-04 NOTE — Op Note (Addendum)
Op Note  Surgeon: Stark Klein, MD  Assistant: Mosie Lukes, MD (Resident)  Procedures: Diagnostic laparoscopy, laparoscopic lysis of adhesions (>1 hour)  Preoperative diagnosis: Gallbladder cancer  Postoperative diagnosis: Metastatic gallbladder cancer with peritoneal carcinomatosis  Anesthesia: General endotracheal  EBL: <50cc  Indication: 65 y/o F hx cholecystectomy in October with gallbladder cancer discovered incidentally on pathology (at least T3N1). The patient is now s/p neoadjuvant chemotherapy and presents today for diagnostic laparoscopy, possible portal lymphadenectomy, possible hepatectomy.  Findings: - Peritoneal carcinomatosis, adenocarcinoma confirmed on frozen section biopsy of at least 4 spots - Significant burden of adhesions between liver, omentum, and anterior abdominal wall - Circular, purple foreign body identified adherent to anterior abdominal wall by right inferior liver margin  Procedure: Informed consent was obtained in the preoperative holding area. The patient was brought to the operating room and placed in the supine position on the operating room table. General endotracheal anesthesia was performed. Preoperative antibiotics were administered. A time out was performed where the patient, procedure, operative site, and operating room staff were identified and confirmed.  We began with diagnostic laparoscopy. A 56mm incision was made in the left upper quadrant beneath the costal margin and the peritoneal cavity was entered with an optical trocar. Pneumoperitoneum was established to 79mmHg, which the patient tolerated well. The laparoscope was inserted and no injuries were identified from abdominal entry. A significant burden of adhesions in all 4 quadrants of the abdomen were immediately encountered. Two additional 81mm ports were placed in the left hemiabdomen under direct visualization.   We then proceeded with laparoscopic lysis of adhesions using blunt  dissection and cautery using the Harmonic device for >1 hour. We identified a foreign body adherent to the anterior abdominal wall, purple in color, resembling a 3-4 cm round disc. Given dense adhesions between the foreign body and omentum, we elected to leave it in place. Multiple firm nodules were identified adherent to the anterior abdominal wall in both the RUQ as well as periumbilical area. These were primarily in the omentum, though one was between the liver and the abdominal wall.  Several of these nodules were excised and sent to pathology for frozen section, with preliminary pathology results consistent with adenocarcinoma.  Given the presence of peritoneal carcinomatosis, we elected to abort the procedure at this time. This plan had been expressed to the patient preoperatively; if cancer was found outside of the surgical target, the procedure would be aborted.  Hemostasis was confirmed. Trocars were removed under direct visualization and pneumoperitoneum was evacuated. The port sites were all closed with 4-0 Monocryl and dermabond.  All counts were correct x 2 at the end of the procedure. The patient tolerated the procedure well and was transported to the PACU in stable condition.

## 2021-01-04 NOTE — Interval H&P Note (Signed)
History and Physical Interval Note:  01/04/2021 7:34 AM  Kristina Orozco  has presented today for surgery, with the diagnosis of GALLBLADDER CANCER.  The various methods of treatment have been discussed with the patient and family. After consideration of risks, benefits and other options for treatment, the patient has consented to  Procedure(s) with comments: LAPAROSCOPY DIAGNOSTIC (N/A) - EPIDURAL PARTIAL HEPATECTOMY (N/A) - EPIDURAL PORTAL LYMPH NODE DISSECTION (N/A) - EPIDURAL as a surgical intervention.  The patient's history has been reviewed, patient examined, no change in status, stable for surgery.  I have reviewed the patient's chart and labs.  Questions were answered to the patient's satisfaction.     Stark Klein

## 2021-01-04 NOTE — Telephone Encounter (Signed)
C- please schedule appt on 6/16 at 1:45. MD; labs- cbc/cmp/Ca-19-9. Thanks  GB

## 2021-01-04 NOTE — Telephone Encounter (Signed)
Patient called stating that she thinks she needs more infusions. She currently does not have an follow up appointments scheduled and looks like she had surgery today. Please advise

## 2021-01-04 NOTE — Telephone Encounter (Signed)
Dr. B please advise- per pt's opnote- pt is requesting chemotherapy tx.  "Multiple firm nodules were identified adherent to the anterior abdominal wall in both the RUQ as well as periumbilical area. These were primarily in the omentum, though one was between the liver and the abdominal wall.  Several of these nodules were excised and sent to pathology for frozen section, with preliminary pathology results consistent with adenocarcinoma.  Given the presence of peritoneal carcinomatosis, we elected to abort the procedure at this time. This plan had been expressed to the patient preoperatively; if cancer was found outside of the surgical target, the procedure would be aborted."

## 2021-01-04 NOTE — Discharge Instructions (Addendum)
[place] Office Phone Number [phone]   POST OP INSTRUCTIONS  Always review your discharge instruction sheet given to you by the facility where your surgery was performed.  IF YOU HAVE DISABILITY OR FAMILY LEAVE FORMS, YOU MUST BRING THEM TO THE OFFICE FOR PROCESSING.  DO NOT GIVE THEM TO YOUR DOCTOR.  A prescription for pain medication may be given to you upon discharge.  Take your pain medication as prescribed, if needed.  If narcotic pain medicine is not needed, then you may take acetaminophen (Tylenol) or ibuprofen (Advil) as needed. Take your usually prescribed medications unless otherwise directed If you need a refill on your pain medication, please contact your pharmacy.  They will contact our office to request authorization.  Prescriptions will not be filled after 5pm or on week-ends. You should eat very light the first 24 hours after surgery, such as soup, crackers, pudding, etc.  Resume your normal diet the day after surgery It is common to experience some constipation if taking pain medication after surgery.  Increasing fluid intake and taking a stool softener will usually help or prevent this problem from occurring.  A mild laxative (Milk of Magnesia or Miralax) should be taken according to package directions if there are no bowel movements after 48 hours. You may shower in 48 hours.  The surgical glue will flake off in 2-3 weeks.   ACTIVITIES:  No strenuous activity or heavy lifting for 1 week.   You may drive when you no longer are taking prescription pain medication, you can comfortably wear a seatbelt, and you can safely maneuver your car and apply brakes. RETURN TO WORK:  _________to be determined._______________ Dennis Bast should see your doctor in the office for a follow-up appointment approximately three-four weeks after your surgery.    WHEN TO CALL YOUR DOCTOR: Fever over 101.0 Nausea and/or vomiting. Extreme swelling or bruising. Continued bleeding from  incision. Increased pain, redness, or drainage from the incision.  The clinic staff is available to answer your questions during regular business hours.  Please don't hesitate to call and ask to speak to one of the nurses for clinical concerns.  If you have a medical emergency, go to the nearest emergency room or call 911.  A surgeon from [facility] is always on call at the hospital.  For further questions, please visit [email or web address]

## 2021-01-04 NOTE — Anesthesia Procedure Notes (Signed)
Procedure Name: Intubation Date/Time: 01/04/2021 7:54 AM Performed by: Barrington Ellison, CRNA Pre-anesthesia Checklist: Patient identified, Emergency Drugs available, Suction available and Patient being monitored Patient Re-evaluated:Patient Re-evaluated prior to induction Oxygen Delivery Method: Circle System Utilized Preoxygenation: Pre-oxygenation with 100% oxygen Induction Type: IV induction Ventilation: Mask ventilation without difficulty and Oral airway inserted - appropriate to patient size Laryngoscope Size: Mac and 3 Grade View: Grade II Tube type: Oral Tube size: 7.0 mm Number of attempts: 1 Airway Equipment and Method: Stylet and Oral airway Placement Confirmation: ETT inserted through vocal cords under direct vision, positive ETCO2 and breath sounds checked- equal and bilateral Secured at: 22 cm Tube secured with: Tape Dental Injury: Teeth and Oropharynx as per pre-operative assessment  Comments: By Rexford Maus, SRNA

## 2021-01-04 NOTE — Transfer of Care (Signed)
Immediate Anesthesia Transfer of Care Note  Patient: Kristina Orozco  Procedure(s) Performed: LAPAROSCOPY DIAGNOSTIC LAPAROSCOPIC LYSIS OF ADHESIONS  Patient Location: PACU  Anesthesia Type:General  Level of Consciousness: awake and alert   Airway & Oxygen Therapy: Patient Spontanous Breathing and Patient connected to face mask oxygen  Post-op Assessment: Report given to RN and Post -op Vital signs reviewed and stable  Post vital signs: Reviewed and stable  Last Vitals:  Vitals Value Taken Time  BP 132/70 01/04/21 1021  Temp    Pulse 100 01/04/21 1022  Resp 20 01/04/21 1022  SpO2 98 % 01/04/21 1022  Vitals shown include unvalidated device data.  Last Pain:  Vitals:   01/04/21 0617  TempSrc:   PainSc: 0-No pain      Patients Stated Pain Goal: 2 (21/79/81 0254)  Complications: No notable events documented.

## 2021-01-04 NOTE — Anesthesia Procedure Notes (Signed)
Arterial Line Insertion Start/End6/14/2022 7:00 AM, 01/04/2021 7:05 AM Performed by: Barrington Ellison, CRNA, CRNA  Patient location: Pre-op. Preanesthetic checklist: patient identified, IV checked, site marked, risks and benefits discussed, surgical consent, monitors and equipment checked, pre-op evaluation, timeout performed and anesthesia consent Lidocaine 1% used for infiltration Left, radial was placed Catheter size: 20 G Hand hygiene performed  and Seldinger technique used  Attempts: 2 Procedure performed without using ultrasound guided technique. Following insertion, Biopatch and dressing applied. Post procedure assessment: normal  Patient tolerated the procedure well with no immediate complications.

## 2021-01-04 NOTE — Anesthesia Postprocedure Evaluation (Signed)
Anesthesia Post Note  Patient: Kristina Orozco  Procedure(s) Performed: LAPAROSCOPY DIAGNOSTIC LAPAROSCOPIC LYSIS OF ADHESIONS     Patient location during evaluation: PACU Anesthesia Type: General Level of consciousness: awake and alert and oriented Pain management: pain level controlled Vital Signs Assessment: post-procedure vital signs reviewed and stable Respiratory status: spontaneous breathing, nonlabored ventilation and respiratory function stable Cardiovascular status: blood pressure returned to baseline Postop Assessment: no apparent nausea or vomiting Anesthetic complications: no   No notable events documented.  Last Vitals:  Vitals:   01/04/21 1035 01/04/21 1050  BP: 140/71 134/68  Pulse: (!) 103 98  Resp: (!) 24 17  Temp:  (!) 36.4 C  SpO2: 93% 93%    Last Pain:  Vitals:   01/04/21 1050  TempSrc:   PainSc: 0-No pain                 Brennan Bailey

## 2021-01-05 ENCOUNTER — Encounter (HOSPITAL_COMMUNITY): Payer: Self-pay | Admitting: General Surgery

## 2021-01-05 ENCOUNTER — Encounter: Payer: Self-pay | Admitting: Internal Medicine

## 2021-01-05 LAB — SURGICAL PATHOLOGY

## 2021-01-05 NOTE — Addendum Note (Signed)
Addended by: Gloris Ham on: 01/05/2021 08:48 AM   Modules accepted: Orders

## 2021-01-06 ENCOUNTER — Inpatient Hospital Stay (HOSPITAL_BASED_OUTPATIENT_CLINIC_OR_DEPARTMENT_OTHER): Payer: Medicare Other | Admitting: Internal Medicine

## 2021-01-06 ENCOUNTER — Other Ambulatory Visit: Payer: Self-pay

## 2021-01-06 ENCOUNTER — Inpatient Hospital Stay: Payer: Medicare Other | Attending: Oncology

## 2021-01-06 DIAGNOSIS — Z79899 Other long term (current) drug therapy: Secondary | ICD-10-CM | POA: Diagnosis not present

## 2021-01-06 DIAGNOSIS — Z5111 Encounter for antineoplastic chemotherapy: Secondary | ICD-10-CM | POA: Insufficient documentation

## 2021-01-06 DIAGNOSIS — C23 Malignant neoplasm of gallbladder: Secondary | ICD-10-CM

## 2021-01-06 DIAGNOSIS — E876 Hypokalemia: Secondary | ICD-10-CM | POA: Insufficient documentation

## 2021-01-06 DIAGNOSIS — Z5112 Encounter for antineoplastic immunotherapy: Secondary | ICD-10-CM | POA: Insufficient documentation

## 2021-01-06 LAB — COMPREHENSIVE METABOLIC PANEL
ALT: 18 U/L (ref 0–44)
AST: 30 U/L (ref 15–41)
Albumin: 3.6 g/dL (ref 3.5–5.0)
Alkaline Phosphatase: 65 U/L (ref 38–126)
Anion gap: 8 (ref 5–15)
BUN: 8 mg/dL (ref 8–23)
CO2: 30 mmol/L (ref 22–32)
Calcium: 9 mg/dL (ref 8.9–10.3)
Chloride: 101 mmol/L (ref 98–111)
Creatinine, Ser: 0.68 mg/dL (ref 0.44–1.00)
GFR, Estimated: >60 mL/min (ref >60)
Glucose, Bld: 124 mg/dL — ABNORMAL HIGH (ref 70–99)
Potassium: 3.7 mmol/L (ref 3.5–5.1)
Sodium: 139 mmol/L (ref 135–145)
Total Bilirubin: 0.4 mg/dL (ref 0.3–1.2)
Total Protein: 7.3 g/dL (ref 6.5–8.1)

## 2021-01-06 LAB — CBC WITH DIFFERENTIAL/PLATELET
Abs Immature Granulocytes: 0.03 10*3/uL (ref 0.00–0.07)
Basophils Absolute: 0 10*3/uL (ref 0.0–0.1)
Basophils Relative: 0 %
Eosinophils Absolute: 0.4 10*3/uL (ref 0.0–0.5)
Eosinophils Relative: 4 %
HCT: 33.3 % — ABNORMAL LOW (ref 36.0–46.0)
Hemoglobin: 10.4 g/dL — ABNORMAL LOW (ref 12.0–15.0)
Immature Granulocytes: 0 %
Lymphocytes Relative: 23 %
Lymphs Abs: 2.4 10*3/uL (ref 0.7–4.0)
MCH: 27.9 pg (ref 26.0–34.0)
MCHC: 31.2 g/dL (ref 30.0–36.0)
MCV: 89.3 fL (ref 80.0–100.0)
Monocytes Absolute: 1.2 10*3/uL — ABNORMAL HIGH (ref 0.1–1.0)
Monocytes Relative: 12 %
Neutro Abs: 6.6 10*3/uL (ref 1.7–7.7)
Neutrophils Relative %: 61 %
Platelets: 260 10*3/uL (ref 150–400)
RBC: 3.73 MIL/uL — ABNORMAL LOW (ref 3.87–5.11)
RDW: 14.4 % (ref 11.5–15.5)
WBC: 10.7 10*3/uL — ABNORMAL HIGH (ref 4.0–10.5)
nRBC: 0 % (ref 0.0–0.2)

## 2021-01-06 LAB — BPAM RBC
Blood Product Expiration Date: 202207032359
Blood Product Expiration Date: 202207042359
ISSUE DATE / TIME: 202206101233
Unit Type and Rh: 6200
Unit Type and Rh: 6200

## 2021-01-06 LAB — TYPE AND SCREEN
ABO/RH(D): A POS
Antibody Screen: NEGATIVE
Unit division: 0
Unit division: 0

## 2021-01-06 MED ORDER — SODIUM CHLORIDE 0.9% FLUSH
10.0000 mL | Freq: Once | INTRAVENOUS | Status: AC
Start: 2021-01-06 — End: 2021-01-06
  Administered 2021-01-06: 10 mL via INTRAVENOUS
  Filled 2021-01-06: qty 10

## 2021-01-06 MED ORDER — HEPARIN SOD (PORK) LOCK FLUSH 100 UNIT/ML IV SOLN
500.0000 [IU] | Freq: Once | INTRAVENOUS | Status: AC
  Administered 2021-01-06: 500 [IU] via INTRAVENOUS
  Filled 2021-01-06: qty 5

## 2021-01-06 NOTE — Assessment & Plan Note (Addendum)
#  Metastatic gallbladder adenocarcinoma [ s/p neoadjuvant chemotherapy unfortunately noted to have metastatic disease on laparoscopy] .CA 19-9-April 2022-WNL.  I discussed with patient unfortunately she has stage IV cancer/which is incurable.  Further treatment is recommended for palliative basis.  I reviewed the recent TOPAZ trial-in which chemotherapy-gemcitabine and cisplatin was combined with immunotherapy-Duva, showed improvement in overall survival.    # I discussed the mechanism of action of immunotherapy.The goal of therapy is palliative; and length of treatments are likely ongoing/based upon the results of the scans. Discussed the potential side effects of immunotherapy including but not limited to diarrhea; skin rash; elevated LFTs/endocrine abnormalities etc.  # Blood glucose- [HbA1c- May 5.5; on glipizide 5 mg/day; continue.   # HTN-  continue with cozaar-stable.  # Hypokalemia-- sec to cisplatin- improved; continue Kdur 20 meQ once a day.  # DISPOSITION: # follow up in 2 weeks/wed- MD: labs- cbc/cmp/mag; ca19-9;TSH;  Gem-Cis-Durvalumab;  d-8-labs- cbc/bmp; Gem-Cis- Dr.B  # 40 minutes face-to-face with the patient discussing the above plan of care; more than 50% of time spent on prognosis/ natural history; counseling and coordination.

## 2021-01-06 NOTE — Progress Notes (Signed)
[facility] CONSULT NOTE  Patient Care Team: Revelo, Elyse Jarvis, MD as PCP - General (Family Medicine) Rico Junker, RN as Registered Nurse Christene Lye, MD (General Surgery) Clent Jacks, RN as Oncology Nurse Navigator  CHIEF COMPLAINTS/PURPOSE OF CONSULTATION: GALLBLADDER CANCER   #  Oncology History Overview Note  10/25- GALLBLADDER CANCER- [Dr.Cannon]/Dr.Byerly- laproscopic cholecystectomy- pT Category: pT3; pN1' POSITIVE for PNI; Histologic Type: Adenocarcinoma, biliary type  Histologic Grade: G3, poorly differentiated  Tumor Size: Greatest dimension: 2.2 cm  Tumor Extent: Tumor directly invades the liver  Lymphovascular Invasion: Present pre-treatement   Tumor Site: Fundus MARGINS  Margin Status for Invasive Carcinoma:      Margin Involved by Invasive Carcinoma: Liver parenchymal / hepatic  Bed; Number of Lymph Nodes with Tumor: 1       Number of Lymph Nodes Examined: 1    # October 25th 2021- Incidental gallbladder ADENOCA-s/p acute cholecystectomy [Dr.Cannon]. Stage III- T3N1; liver margin positive.  CT chest negative for any metastatic disease.  MRI abdomen with and without contrast-residual disease noted gallbladder bed; no evidence of distant metastases or lymphadenopathy. NOv 3rd 2021-CA 19-9 > 2000;     # NEOADJUVANT CHEMO-12/15-  Gem-cis [d1 & d-8 q 21 days] ; DEC 2021- Ca-[id number].   June 14th, 2022- Dr.Byerly- METASTATIC DISEASE [. Multiple firm nodules were identified adherent to the anterior abdominal wall in both the RUQ as well as periumbilical area. These were primarily in the omentum, though one was between the liver and the abdominal wall.  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  female incidental pathologic gallbladder cancer  currently s/p 6 cycles of neoadjuvant chemotherapy- Gem-Cis is here a follow up to review the results of the operative pathology/plan of care.  Patient underwent laparoscopy with intention for extensive curative surgery for gallbladder cancer.  Unfortunately noted to have metastatic deposits/omental nodules and hence surgery was aborted.  Patient is healing well from her incisions.  Complains of mild discomfort.  Otherwise denies any significant pain.  No fevers or chills.  No nausea no vomiting.   Review of Systems  Constitutional:  Negative for chills, diaphoresis, fever and malaise/fatigue.  HENT:  Negative for nosebleeds and sore throat.   Eyes:  Negative for double vision.  Respiratory:  Negative for cough, hemoptysis, sputum production, shortness of breath and wheezing.   Cardiovascular:  Negative for chest pain, palpitations, orthopnea and leg swelling.  Gastrointestinal:  Positive for constipation. Negative for abdominal pain, blood in stool, diarrhea, heartburn, melena, nausea and vomiting.  Genitourinary:  Negative for dysuria, frequency and urgency.  Musculoskeletal:  Negative for  back pain and joint pain.  Skin: Negative.  Negative for itching and rash.  Neurological:  Negative for dizziness, tingling, focal weakness, weakness and headaches.   Endo/Heme/Allergies:  Does not bruise/bleed easily.  Psychiatric/Behavioral:  Negative for depression. The patient is not nervous/anxious and does not have insomnia.     MEDICAL HISTORY:  Past Medical History:  Diagnosis Date   Cancer ([place]) 05/17/2020   gallbladder   Diabetes mellitus without complication ([place])    History of chemotherapy    started 06/2020   Hypertension     SURGICAL HISTORY: Past Surgical History:  Procedure Laterality Date   ABDOMINAL HYSTERECTOMY     APPENDECTOMY  2004   BREAST BIOPSY Left 2011   BREAST BIOPSY Right 08/26/13   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/28/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   CHOLECYSTECTOMY N/A 05/17/2020   Procedure: LAPAROSCOPIC CHOLECYSTECTOMY;  Surgeon: Fredirick Maudlin, MD;  Location: [facility] ORS;  Service: General;  Laterality: N/A;   COLONOSCOPY WITH PROPOFOL N/A 01/31/2018   Procedure: COLONOSCOPY WITH PROPOFOL;  Surgeon: Jonathon Bellows, MD;  Location[facility];  Service: Gastroenterology;  Laterality: N/A;   LAPAROSCOPIC LYSIS OF ADHESIONS  01/04/2021   Procedure: LAPAROSCOPIC LYSIS OF ADHESIONS;  Surgeon: Stark Klein, MD;  Location: [facility];  Service: General;;  1 HOUR   LAPAROSCOPY N/A 01/04/2021   Procedure: LAPAROSCOPY DIAGNOSTIC;  Surgeon: Stark Klein, MD;  Location: [facility];  Service: General;  Laterality: N/A;  EPIDURAL   PORTACATH PLACEMENT Right 07/02/2020   Procedure: INSERTION PORT-A-CATH, chemotherapy;  Surgeon: Fredirick Maudlin, MD;  Location: [facility] ORS;  Service: General;  Laterality: Right;    SOCIAL HISTORY: Social History   Socioeconomic History   Marital status: Single    Spouse name: Not on file   Number of children: Not on file   Years of education: Not on file   Highest education level: Not on file  Occupational History   Occupation: special needs worker  Tobacco Use   Smoking status: Never   Smokeless tobacco: Never  Vaping Use   Vaping Use: Never used  Substance and Sexual Activity   Alcohol use: Yes    Alcohol/week:  2.0 standard drinks    Types: 2 Cans of beer per week    Comment: beer on weekend   Drug use: No   Sexual activity: Not Currently  Other Topics Concern   Not on file  Social History Narrative   Lives in [place]; with son. 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 Never smoked; beer/iqour over weekends.    Social Determinants of Health   Financial Resource Strain: Not on file  Food Insecurity: Not on file  Transportation Needs: Not on file  Physical Activity: Not on file  Stress: Not on file  Social Connections: Not on file  Intimate Partner Violence: Not on file    FAMILY HISTORY: Family History  Problem Relation Age of Onset   Pancreatic cancer Mother        AT 35 years   Breast cancer Sister        appx-55 years   Pancreatic cancer Maternal Grandmother     ALLERGIES:  is allergic to seasonal ic [cholestatin].  MEDICATIONS:  Current Outpatient Medications  Medication Sig Dispense Refill   Chlorpheniramine Maleate (ALLERGY RELIEF PO) Take 1 tablet by mouth daily as needed (allergies).     CINNAMON PO Take 1 tablet by mouth daily.     GARLIC PO Take 1 tablet by mouth daily.     glipiZIDE (GLUCOTROL) 5  MG tablet TAKE 1 TABLET BY MOUTH ONCE DAILY BEFORE BREAKFAST (Patient taking differently: Take 5 mg by mouth daily before breakfast.) 30 tablet 0   lidocaine-prilocaine (EMLA) cream Apply 1 application topically as needed. Apply on the port 30-45 mins prior to port access 30 g 1   losartan (COZAAR) 25 MG tablet Take 25 mg by mouth every morning.      Multiple Vitamins-Minerals (MULTIVITAMIN WITH MINERALS) tablet Take 1 tablet by mouth daily.     ondansetron (ZOFRAN) 8 MG tablet One pill every 8 hours as needed for nausea/vomitting. 40 tablet 1   oxyCODONE (OXY IR/ROXICODONE) 5 MG immediate release tablet Take 1 tablet (5 mg total) by mouth every 6 (six) hours as needed for severe pain. 15 tablet 0   pravastatin (PRAVACHOL) 10 MG tablet Take 10 mg by mouth  4 (four) times a week.     prochlorperazine (COMPAZINE) 10 MG tablet Take 1 tablet (10 mg total) by mouth every 6 (six) hours as needed for nausea or vomiting. 40 tablet 1   No current facility-administered medications for this visit.      Marland Kitchen  PHYSICAL EXAMINATION: ECOG PERFORMANCE STATUS: 0 - Asymptomatic  Vitals:   01/06/21 1338  BP: 121/76  Pulse: (!) 109  Resp: 17  SpO2: 95%   Filed Weights   01/06/21 1338  Weight: 220 lb 6.4 oz (100 kg)    Physical Exam HENT:     Head: Normocephalic and atraumatic.     Mouth/Throat:     Pharynx: No oropharyngeal exudate.  Eyes:     Pupils: Pupils are equal, round, and reactive to light.  Cardiovascular:     Rate and Rhythm: Normal rate and regular rhythm.  Pulmonary:     Effort: Pulmonary effort is normal. No respiratory distress.     Breath sounds: Normal breath sounds. No wheezing.  Abdominal:     General: Bowel sounds are normal. There is no distension.     Palpations: Abdomen is soft. There is no mass.     Tenderness: There is no abdominal tenderness. There is no guarding or rebound.  Musculoskeletal:        General: No tenderness. Normal range of motion.     Cervical back: Normal range of motion and neck supple.  Skin:    General: Skin is warm.  Neurological:     Mental Status: She is alert and oriented to person, place, and time.  Psychiatric:        Mood and Affect: Affect normal.     LABORATORY DATA:  I have reviewed the data as listed Lab Results  Component Value Date   WBC 10.7 (H) 01/06/2021   HGB 10.4 (L) 01/06/2021   HCT 33.3 (L) 01/06/2021   MCV 89.3 01/06/2021   PLT 260 01/06/2021   Recent Labs    11/10/20 0907 12/29/20 0945 01/06/21 1316  NA 140 141 139  K 3.9 4.0 3.7  CL 105 101 101  CO2 '24 29 30  ' GLUCOSE 144* 95 124*  BUN '15 11 8  ' CREATININE 0.83 0.76 0.68  CALCIUM 8.8* 9.5 9.0  GFRNONAA >60 >60 >60  PROT 6.9 7.2 7.3  ALBUMIN 3.9 3.8 3.6  AST '20 22 30  ' ALT '12 20 18  ' ALKPHOS 91 57 65   BILITOT 0.3 0.6 0.4    RADIOGRAPHIC STUDIES: I have personally reviewed the radiological images as listed and agreed with the findings in the report. No results found.   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  I discussed with patient unfortunately she has stage IV cancer/which is incurable.  Further treatment is recommended for palliative basis.  I reviewed the recent TOPAZ trial-in which chemotherapy-gemcitabine and cisplatin was combined with immunotherapy-Duva, showed improvement in overall survival.    # I discussed the mechanism of action of immunotherapy.The goal of therapy is palliative; and length of treatments are likely ongoing/based upon the results of the scans. Discussed the potential side effects of immunotherapy including but not limited to diarrhea; skin rash; elevated LFTs/endocrine abnormalities etc.  # Blood glucose- [HbA1c- May 5.5; on glipizide 5 mg/day; continue.   # HTN-  continue with cozaar-stable.  # Hypokalemia-- sec to cisplatin- improved; continue Kdur 20 meQ once a day.  # DISPOSITION: # follow up in 2 weeks/wed- MD: labs- cbc/cmp/mag; ca19-9;TSH;  Gem-Cis-Durvalumab;  d-8-labs- cbc/bmp; Gem-Cis- Dr.B  # 40 minutes face-to-face with the patient discussing the above plan of care; more than 50% of time spent on prognosis/ natural history; counseling and coordination.       All questions were answered. The patient knows to call the clinic with any problems, questions or concerns.   Cammie Sickle, MD 01/06/2021 2:38 PM

## 2021-01-07 LAB — CANCER ANTIGEN 19-9: CA 19-9: 1739 U/mL — ABNORMAL HIGH (ref 0–35)

## 2021-01-11 NOTE — Discharge Summary (Addendum)
Physician Discharge Summary  Patient ID: Kristina Orozco [place] MRN: [id number] DOB/AGE: 66/15/1957 66 y.o.  Admit date: 01/04/2021 Discharge date: 01/04/2021  Admission Diagnoses: Gallbladder cancer  Discharge Diagnoses:  Active Problems:   Gallbladder cancer [facility])   Discharged Condition: stable  Hospital Course:  Pt was discharged from the [facility] after diagnostic laparoscopy found metastatic disease.   Consults: None  Significant Diagnostic Studies: none  Treatments: surgery: see above  Discharge Exam: Blood pressure 134/68, pulse 98, temperature (!) 97.5 F (36.4 C), resp. rate 17, height 5\' 6"  (1.676 m), weight 97.5 kg, SpO2 93 %. General appearance: alert GI: soft, dressings intact.  Disposition: Discharge disposition: 01-Home or Self Care       Discharge Instructions     Call MD for:  difficulty breathing, headache or visual disturbances   Complete by: As directed    Call MD for:  persistant nausea and vomiting   Complete by: As directed    Call MD for:  redness, tenderness, or signs of infection (pain, swelling, redness, odor or green/yellow discharge around incision site)   Complete by: As directed    Call MD for:  severe uncontrolled pain   Complete by: As directed    Call MD for:  temperature >100.4   Complete by: As directed    Diet - low sodium heart healthy   Complete by: As directed    Increase activity slowly   Complete by: As directed       Allergies as of 01/04/2021       Reactions   Seasonal Ic [cholestatin]    Seasonal allergies causes drainage and stuffy nose        Medication List     TAKE these medications    ALLERGY RELIEF PO Take 1 tablet by mouth daily as needed (allergies).   CINNAMON PO Take 1 tablet by mouth daily.   GARLIC PO Take 1 tablet by mouth daily.   lidocaine-prilocaine cream Commonly known as: EMLA Apply 1 application topically as needed. Apply on the port 30-45 mins prior to port access   losartan 25 MG  tablet Commonly known as: COZAAR Take 25 mg by mouth every morning.   multivitamin with minerals tablet Take 1 tablet by mouth daily.   ondansetron 8 MG tablet Commonly known as: ZOFRAN One pill every 8 hours as needed for nausea/vomitting.   oxyCODONE 5 MG immediate release tablet Commonly known as: Oxy IR/ROXICODONE Take 1 tablet (5 mg total) by mouth every 6 (six) hours as needed for severe pain.   pravastatin 10 MG tablet Commonly known as: PRAVACHOL Take 10 mg by mouth 4 (four) times a week.   prochlorperazine 10 MG tablet Commonly known as: COMPAZINE Take 1 tablet (10 mg total) by mouth every 6 (six) hours as needed for nausea or vomiting.       ASK your doctor about these medications    glipiZIDE 5 MG tablet Commonly known as: [place] 1 TABLET BY MOUTH ONCE DAILY BEFORE BREAKFAST        Follow-up Information     Cammie Sickle, MD Follow up.   Specialties: Internal Medicine, Oncology Why: His office will call Contact information: [place] [address] [phone]         Stark Klein, MD Follow up in 4 week(s).   Specialty: General Surgery Contact information: [address] [place] [address] [phone]  Signed: Stark Klein 01/11/2021, 1:28 PM

## 2021-01-12 ENCOUNTER — Other Ambulatory Visit: Payer: Self-pay

## 2021-01-12 NOTE — Progress Notes (Signed)
The proposed treatment discussed in conference is for discussion purpose only and is not a binding recommendation.  The patients have not been physically examined, or presented with their treatment options.  Therefore, final treatment plans cannot be decided.  

## 2021-01-13 ENCOUNTER — Telehealth: Payer: Self-pay | Admitting: *Deleted

## 2021-01-13 MED ORDER — OXYCODONE HCL 5 MG PO TABS: 5.0000 mg | ORAL_TABLET | Freq: Four times a day (QID) | ORAL | 0 refills | Status: DC | PRN

## 2021-01-13 NOTE — Telephone Encounter (Signed)
Patient called reporting that she has not been able to sleep in several nights due to the pain she is having in her side where the tumor is and that tylenol is not helping, "all it does is make me sweat". She is asking for medicine to take at bedtime for the pain so that she can sleep. Please advise

## 2021-01-13 NOTE — Telephone Encounter (Signed)
I called and left patient a VM.

## 2021-01-13 NOTE — Telephone Encounter (Signed)
Would patient be interested in virtual [facility] apt or in clinic visit?

## 2021-01-13 NOTE — Telephone Encounter (Signed)
I spoke with patient by phone.  She reports doing well during the daytime but finds it difficult to sleep at night due to pain as she has to lie on her side.  She was taking oxycodone for pain but is out of this.  However, she did not feel like 1 tablet of the oxycodone was effective.  Will refill oxycodone and liberalize dosing to 1 to 2 tablets Q6H as needed. #60

## 2021-01-13 NOTE — Addendum Note (Signed)
Addended by: Irean Hong on: 01/13/2021 02:43 PM   Modules accepted: Orders

## 2021-01-17 ENCOUNTER — Telehealth: Payer: Self-pay | Admitting: *Deleted

## 2021-01-17 NOTE — Telephone Encounter (Signed)
Error,  patient  already has appointment Wed to be seen and was wanting to see doctor with  her treatment

## 2021-01-18 ENCOUNTER — Other Ambulatory Visit: Payer: Self-pay

## 2021-01-18 DIAGNOSIS — C23 Malignant neoplasm of gallbladder: Secondary | ICD-10-CM

## 2021-01-18 DIAGNOSIS — R5383 Other fatigue: Secondary | ICD-10-CM

## 2021-01-19 ENCOUNTER — Encounter: Payer: Self-pay | Admitting: Internal Medicine

## 2021-01-19 ENCOUNTER — Inpatient Hospital Stay (HOSPITAL_BASED_OUTPATIENT_CLINIC_OR_DEPARTMENT_OTHER): Payer: Medicare Other | Admitting: Internal Medicine

## 2021-01-19 ENCOUNTER — Inpatient Hospital Stay: Payer: Medicare Other

## 2021-01-19 VITALS — BP 136/54 | HR 112 | Temp 98.5°F | Resp 18 | Wt 226.0 lb

## 2021-01-19 DIAGNOSIS — Z5112 Encounter for antineoplastic immunotherapy: Secondary | ICD-10-CM | POA: Diagnosis not present

## 2021-01-19 DIAGNOSIS — R180 Malignant ascites: Secondary | ICD-10-CM

## 2021-01-19 DIAGNOSIS — C23 Malignant neoplasm of gallbladder: Secondary | ICD-10-CM

## 2021-01-19 DIAGNOSIS — R5383 Other fatigue: Secondary | ICD-10-CM

## 2021-01-19 LAB — COMPREHENSIVE METABOLIC PANEL
ALT: 12 U/L (ref 0–44)
AST: 17 U/L (ref 15–41)
Albumin: 3.2 g/dL — ABNORMAL LOW (ref 3.5–5.0)
Alkaline Phosphatase: 61 U/L (ref 38–126)
Anion gap: 8 (ref 5–15)
BUN: 20 mg/dL (ref 8–23)
CO2: 26 mmol/L (ref 22–32)
Calcium: 8.4 mg/dL — ABNORMAL LOW (ref 8.9–10.3)
Chloride: 102 mmol/L (ref 98–111)
Creatinine, Ser: 0.85 mg/dL (ref 0.44–1.00)
GFR, Estimated: >60 mL/min (ref >60)
Glucose, Bld: 138 mg/dL — ABNORMAL HIGH (ref 70–99)
Potassium: 3.4 mmol/L — ABNORMAL LOW (ref 3.5–5.1)
Sodium: 136 mmol/L (ref 135–145)
Total Bilirubin: 0.6 mg/dL (ref 0.3–1.2)
Total Protein: 6.4 g/dL — ABNORMAL LOW (ref 6.5–8.1)

## 2021-01-19 LAB — CBC WITH DIFFERENTIAL/PLATELET
Abs Immature Granulocytes: 0.19 10*3/uL — ABNORMAL HIGH (ref 0.00–0.07)
Basophils Absolute: 0.1 10*3/uL (ref 0.0–0.1)
Basophils Relative: 1 %
Eosinophils Absolute: 0.8 10*3/uL — ABNORMAL HIGH (ref 0.0–0.5)
Eosinophils Relative: 4 %
HCT: 34.8 % — ABNORMAL LOW (ref 36.0–46.0)
Hemoglobin: 10.6 g/dL — ABNORMAL LOW (ref 12.0–15.0)
Immature Granulocytes: 1 %
Lymphocytes Relative: 9 %
Lymphs Abs: 1.5 10*3/uL (ref 0.7–4.0)
MCH: 27.5 pg (ref 26.0–34.0)
MCHC: 30.5 g/dL (ref 30.0–36.0)
MCV: 90.2 fL (ref 80.0–100.0)
Monocytes Absolute: 2.1 10*3/uL — ABNORMAL HIGH (ref 0.1–1.0)
Monocytes Relative: 12 %
Neutro Abs: 12.9 10*3/uL — ABNORMAL HIGH (ref 1.7–7.7)
Neutrophils Relative %: 73 %
Platelets: 345 10*3/uL (ref 150–400)
RBC: 3.86 MIL/uL — ABNORMAL LOW (ref 3.87–5.11)
RDW: 14.8 % (ref 11.5–15.5)
WBC: 17.6 10*3/uL — ABNORMAL HIGH (ref 4.0–10.5)
nRBC: 0 % (ref 0.0–0.2)

## 2021-01-19 LAB — TSH: TSH: 5.326 u[IU]/mL — ABNORMAL HIGH (ref 0.350–4.500)

## 2021-01-19 LAB — MAGNESIUM: Magnesium: 2.2 mg/dL (ref 1.7–2.4)

## 2021-01-19 MED ORDER — SODIUM CHLORIDE 0.9% FLUSH
10.0000 mL | INTRAVENOUS | Status: DC | PRN
  Filled 2021-01-19: qty 10

## 2021-01-19 MED ORDER — SODIUM CHLORIDE 0.9 % IV SOLN
10.0000 mg | Freq: Once | INTRAVENOUS | Status: AC
  Administered 2021-01-19: 10 mg via INTRAVENOUS
  Filled 2021-01-19: qty 10

## 2021-01-19 MED ORDER — SODIUM CHLORIDE 0.9 % IV SOLN
25.0000 mg/m2 | Freq: Once | INTRAVENOUS | Status: AC
  Administered 2021-01-19: 53 mg via INTRAVENOUS
  Filled 2021-01-19: qty 53

## 2021-01-19 MED ORDER — SODIUM CHLORIDE 0.9 % IV SOLN
1500.0000 mg | Freq: Once | INTRAVENOUS | Status: AC
  Administered 2021-01-19: 1500 mg via INTRAVENOUS
  Filled 2021-01-19: qty 30

## 2021-01-19 MED ORDER — SODIUM CHLORIDE 0.9 % IV SOLN
2000.0000 mg | Freq: Once | INTRAVENOUS | Status: AC
  Administered 2021-01-19: 2000 mg via INTRAVENOUS
  Filled 2021-01-19: qty 52.6

## 2021-01-19 MED ORDER — MAGNESIUM SULFATE 2 GM/50ML IV SOLN
2.0000 g | Freq: Once | INTRAVENOUS | Status: AC
Start: 2021-01-19 — End: 2021-01-19
  Administered 2021-01-19: 2 g via INTRAVENOUS
  Filled 2021-01-19: qty 50

## 2021-01-19 MED ORDER — PALONOSETRON HCL INJECTION 0.25 MG/5ML
0.2500 mg | Freq: Once | INTRAVENOUS | Status: AC
  Administered 2021-01-19: 0.25 mg via INTRAVENOUS
  Filled 2021-01-19: qty 5

## 2021-01-19 MED ORDER — HEPARIN SOD (PORK) LOCK FLUSH 100 UNIT/ML IV SOLN
INTRAVENOUS | Status: AC
  Filled 2021-01-19: qty 5

## 2021-01-19 MED ORDER — SODIUM CHLORIDE 0.9% FLUSH
10.0000 mL | Freq: Once | INTRAVENOUS | Status: AC
Start: 2021-01-19 — End: 2021-01-19
  Administered 2021-01-19: 10 mL via INTRAVENOUS
  Filled 2021-01-19: qty 10

## 2021-01-19 MED ORDER — HEPARIN SOD (PORK) LOCK FLUSH 100 UNIT/ML IV SOLN
500.0000 [IU] | Freq: Once | INTRAVENOUS | Status: AC | PRN
  Filled 2021-01-19: qty 5

## 2021-01-19 MED ORDER — SODIUM CHLORIDE 0.9 % IV SOLN
Freq: Once | INTRAVENOUS | Status: AC
  Filled 2021-01-19: qty 250

## 2021-01-19 MED ORDER — HEPARIN SOD (PORK) LOCK FLUSH 100 UNIT/ML IV SOLN
500.0000 [IU] | Freq: Once | INTRAVENOUS | Status: AC
  Administered 2021-01-19: 500 [IU] via INTRAVENOUS
  Filled 2021-01-19: qty 5

## 2021-01-19 MED ORDER — POTASSIUM CHLORIDE IN NACL 20-0.9 MEQ/L-% IV SOLN
Freq: Once | INTRAVENOUS | Status: AC
  Filled 2021-01-19: qty 1000

## 2021-01-19 MED ORDER — SODIUM CHLORIDE 0.9 % IV SOLN
150.0000 mg | Freq: Once | INTRAVENOUS | Status: AC
  Administered 2021-01-19: 150 mg via INTRAVENOUS
  Filled 2021-01-19: qty 150

## 2021-01-19 NOTE — Patient Instructions (Signed)
[facility]  Discharge Instructions: Thank you for choosing [facility] to provide your oncology and hematology care.  If you have a lab appointment with the [facility], please go directly to the [facility] and check in at the registration area.  Wear comfortable clothing and clothing appropriate for easy access to any Portacath or PICC line.   We strive to give you quality time with your provider. You may need to reschedule your appointment if you arrive late (15 or more minutes).  Arriving late affects you and other patients whose appointments are after yours.  Also, if you miss three or more appointments without notifying the office, you may be dismissed from the clinic at the provider's discretion.      For prescription refill requests, have your pharmacy contact our office and allow 72 hours for refills to be completed.    Today you received the following chemotherapy and/or immunotherapy agents - gemcitabine, cisplatin, durvalumab      To help prevent nausea and vomiting after your treatment, we encourage you to take your nausea medication as directed.  BELOW ARE SYMPTOMS THAT SHOULD BE REPORTED IMMEDIATELY: *FEVER GREATER THAN 100.4 F (38 C) OR HIGHER *CHILLS OR SWEATING *NAUSEA AND VOMITING THAT IS NOT CONTROLLED WITH YOUR NAUSEA MEDICATION *UNUSUAL SHORTNESS OF BREATH *UNUSUAL BRUISING OR BLEEDING *URINARY PROBLEMS (pain or burning when urinating, or frequent urination) *BOWEL PROBLEMS (unusual diarrhea, constipation, pain near the anus) TENDERNESS IN MOUTH AND THROAT WITH OR WITHOUT PRESENCE OF ULCERS (sore throat, sores in mouth, or a toothache) UNUSUAL RASH, SWELLING OR PAIN  UNUSUAL VAGINAL DISCHARGE OR ITCHING   Items with * indicate a potential emergency and should be followed up as soon as possible or go to the Emergency Department if any problems should occur.  Please show the CHEMOTHERAPY ALERT CARD or  IMMUNOTHERAPY ALERT CARD at check-in to the Emergency Department and triage nurse.  Should you have questions after your visit or need to cancel or reschedule your appointment, please contact [facility]  [phone] and follow the prompts.  Office hours are 8:00 a.m. to 4:30 p.m. Monday - Friday. Please note that voicemails left after 4:00 p.m. may not be returned until the following business day.  We are closed weekends and major holidays. You have access to a nurse at all times for urgent questions. Please call the main number to the clinic [phone] and follow the prompts.  For any non-urgent questions, you may also contact your provider using MyChart. We now offer e-Visits for anyone 80 and older to request care online for non-urgent symptoms. For details visit [email or web address].   Also download the MyChart app! Go to the app store, search "MyChart", open the app, select [facility], and log in with your MyChart username and password.  Due to Covid, a mask is required upon entering the hospital/clinic. If you do not have a mask, one will be given to you upon arrival. For doctor visits, patients may have 1 support person aged 64 or older with them. For treatment visits, patients cannot have anyone with them due to current Covid guidelines and our immunocompromised population.   Durvalumab injection What is this medication? DURVALUMAB (dur VAL ue mab) is a monoclonal antibody. It is used to treat lungcancer. This medicine may be used for other purposes; ask your health care provider orpharmacist if you have questions. COMMON BRAND NAME(S): IMFINZI What should I tell my care team before I  take this medication? They need to know if you have any of these conditions: autoimmune diseases like Crohn's disease, ulcerative colitis, or lupus have had or planning to have an allogeneic stem cell transplant (uses someone else's stem cells) history of organ  transplant history of radiation to the chest nervous system problems like myasthenia gravis or Guillain-Barre syndrome an unusual or allergic reaction to durvalumab, other medicines, foods, dyes, or preservatives pregnant or trying to get pregnant breast-feeding How should I use this medication? This medicine is for infusion into a vein. It is given by a health careprofessional in a hospital or clinic setting. A special MedGuide will be given to you before each treatment. Be sure to readthis information carefully each time. Talk to your pediatrician regarding the use of this medicine in children.Special care may be needed. Overdosage: If you think you have taken too much of this medicine contact apoison control center or emergency room at once. NOTE: This medicine is only for you. Do not share this medicine with others. What if I miss a dose? It is important not to miss your dose. Call your doctor or health careprofessional if you are unable to keep an appointment. What may interact with this medication? Interactions have not been studied. This list may not describe all possible interactions. Give your health care provider a list of all the medicines, herbs, non-prescription drugs, or dietary supplements you use. Also tell them if you smoke, drink alcohol, or use illegaldrugs. Some items may interact with your medicine. What should I watch for while using this medication? This drug may make you feel generally unwell. Continue your course of treatmenteven though you feel ill unless your doctor tells you to stop. You may need blood work done while you are taking this medicine. Do not become pregnant while taking this medicine or for 3 months after stopping it. Women should inform their doctor if they wish to become pregnant or think they might be pregnant. There is a potential for serious side effects to an unborn child. Talk to your health care professional or pharmacist for more information. Do  not breast-feed an infant while taking this medicine orfor 3 months after stopping it. What side effects may I notice from receiving this medication? Side effects that you should report to your doctor or health care professionalas soon as possible: allergic reactions like skin rash, itching or hives, swelling of the face, lips, or tongue black, tarry stools bloody or watery diarrhea breathing problems change in emotions or moods change in sex drive changes in vision chest pain or chest tightness chills confusion cough facial flushing fever headache signs and symptoms of high blood sugar such as dizziness; dry mouth; dry skin; fruity breath; nausea; stomach pain; increased hunger or thirst; increased urination signs and symptoms of liver injury like dark yellow or brown urine; general ill feeling or flu-like symptoms; light-colored stools; loss of appetite; nausea; right upper belly pain; unusually weak or tired; yellowing of the eyes or skin stomach pain trouble passing urine or change in the amount of urine weight gain or weight loss Side effects that usually do not require medical attention (report these toyour doctor or health care professional if they continue or are bothersome): bone pain constipation loss of appetite muscle pain nausea swelling of the ankles, feet, hands tiredness This list may not describe all possible side effects. Call your doctor for medical advice about side effects. You may report side effects to FDA at1-800-FDA-1088. Where should I keep my  medication? This drug is given in a hospital or clinic and will not be stored at home. NOTE: This sheet is a summary. It may not cover all possible information. If you have questions about this medicine, talk to your doctor, pharmacist, orhealth care provider.  2022 Elsevier/Gold Standard (2019-09-18 13:01:29)  Gemcitabine injection What is this medication? GEMCITABINE (jem SYE ta been) is a chemotherapy drug.  This medicine is used to treat many types of cancer like breast cancer, lung cancer, pancreatic cancer,and ovarian cancer. This medicine may be used for other purposes; ask your health care provider orpharmacist if you have questions. COMMON BRAND NAME(S): Gemzar, Infugem What should I tell my care team before I take this medication? They need to know if you have any of these conditions: blood disorders infection kidney disease liver disease lung or breathing disease, like asthma recent or ongoing radiation therapy an unusual or allergic reaction to gemcitabine, other chemotherapy, other medicines, foods, dyes, or preservatives pregnant or trying to get pregnant breast-feeding How should I use this medication? This drug is given as an infusion into a vein. It is administered in a hospitalor clinic by a specially trained health care professional. Talk to your pediatrician regarding the use of this medicine in children.Special care may be needed. Overdosage: If you think you have taken too much of this medicine contact apoison control center or emergency room at once. NOTE: This medicine is only for you. Do not share this medicine with others. What if I miss a dose? It is important not to miss your dose. Call your doctor or health careprofessional if you are unable to keep an appointment. What may interact with this medication? medicines to increase blood counts like filgrastim, pegfilgrastim, sargramostim some other chemotherapy drugs like cisplatin vaccines Talk to your doctor or health care professional before taking any of thesemedicines: acetaminophen aspirin ibuprofen ketoprofen naproxen This list may not describe all possible interactions. Give your health care provider a list of all the medicines, herbs, non-prescription drugs, or dietary supplements you use. Also tell them if you smoke, drink alcohol, or use illegaldrugs. Some items may interact with your medicine. What should  I watch for while using this medication? Visit your doctor for checks on your progress. This drug may make you feel generally unwell. This is not uncommon, as chemotherapy can affect healthy cells as well as cancer cells. Report any side effects. Continue your course oftreatment even though you feel ill unless your doctor tells you to stop. In some cases, you may be given additional medicines to help with side effects.Follow all directions for their use. Call your doctor or health care professional for advice if you get a fever, chills or sore throat, or other symptoms of a cold or flu. Do not treat yourself. This drug decreases your body's ability to fight infections. Try toavoid being around people who are sick. This medicine may increase your risk to bruise or bleed. Call your doctor orhealth care professional if you notice any unusual bleeding. Be careful brushing and flossing your teeth or using a toothpick because you may get an infection or bleed more easily. If you have any dental work done,tell your dentist you are receiving this medicine. Avoid taking products that contain aspirin, acetaminophen, ibuprofen, naproxen, or ketoprofen unless instructed by your doctor. These medicines may hide afever. Do not become pregnant while taking this medicine or for 6 months after stopping it. Women should inform their doctor if they wish to become pregnant or think  they might be pregnant. Men should not father a child while taking this medicine and for 3 months after stopping it. There is a potential for serious side effects to an unborn child. Talk to your health care professional or pharmacist for more information. Do not breast-feed an infant while takingthis medicine or for at least 1 week after stopping it. Men should inform their doctors if they wish to father a child. This medicine may lower sperm counts. Talk with your doctor or health care professional ifyou are concerned about your fertility. What  side effects may I notice from receiving this medication? Side effects that you should report to your doctor or health care professionalas soon as possible: allergic reactions like skin rash, itching or hives, swelling of the face, lips, or tongue breathing problems pain, redness, or irritation at site where injected signs and symptoms of a dangerous change in heartbeat or heart rhythm like chest pain; dizziness; fast or irregular heartbeat; palpitations; feeling faint or lightheaded, falls; breathing problems signs of decreased platelets or bleeding - bruising, pinpoint red spots on the skin, black, tarry stools, blood in the urine signs of decreased red blood cells - unusually weak or tired, feeling faint or lightheaded, falls signs of infection - fever or chills, cough, sore throat, pain or difficulty passing urine signs and symptoms of kidney injury like trouble passing urine or change in the amount of urine signs and symptoms of liver injury like dark yellow or brown urine; general ill feeling or flu-like symptoms; light-colored stools; loss of appetite; nausea; right upper belly pain; unusually weak or tired; yellowing of the eyes or skin swelling of ankles, feet, hands Side effects that usually do not require medical attention (report to yourdoctor or health care professional if they continue or are bothersome): constipation diarrhea hair loss loss of appetite nausea rash vomiting This list may not describe all possible side effects. Call your doctor for medical advice about side effects. You may report side effects to FDA at1-800-FDA-1088. Where should I keep my medication? This drug is given in a hospital or clinic and will not be stored at home. NOTE: This sheet is a summary. It may not cover all possible information. If you have questions about this medicine, talk to your doctor, pharmacist, orhealth care provider.  2022 Elsevier/Gold Standard (2017-10-03 18:06:11)  Cisplatin  injection What is this medication? CISPLATIN (SIS pla tin) is a chemotherapy drug. It targets fast dividing cells, like cancer cells, and causes these cells to die. This medicine is used totreat many types of cancer like bladder, ovarian, and testicular cancers. This medicine may be used for other purposes; ask your health care provider orpharmacist if you have questions. COMMON BRAND NAME(S): Platinol, Platinol -AQ What should I tell my care team before I take this medication? They need to know if you have any of these conditions: eye disease, vision problems hearing problems kidney disease low blood counts, like white cells, platelets, or red blood cells tingling of the fingers or toes, or other nerve disorder an unusual or allergic reaction to cisplatin, carboplatin, oxaliplatin, other medicines, foods, dyes, or preservatives pregnant or trying to get pregnant breast-feeding How should I use this medication? This drug is given as an infusion into a vein. It is administered in a hospitalor clinic by a specially trained health care professional. Talk to your pediatrician regarding the use of this medicine in children.Special care may be needed. Overdosage: If you think you have taken too much of this medicine  contact apoison control center or emergency room at once. NOTE: This medicine is only for you. Do not share this medicine with others. What if I miss a dose? It is important not to miss a dose. Call your doctor or health careprofessional if you are unable to keep an appointment. What may interact with this medication? This medicine may interact with the following medications: foscarnet certain antibiotics like amikacin, gentamicin, neomycin, polymyxin B, streptomycin, tobramycin, vancomycin This list may not describe all possible interactions. Give your health care provider a list of all the medicines, herbs, non-prescription drugs, or dietary supplements you use. Also tell them if  you smoke, drink alcohol, or use illegaldrugs. Some items may interact with your medicine. What should I watch for while using this medication? Your condition will be monitored carefully while you are receiving this medicine. You will need important blood work done while you are taking thismedicine. This drug may make you feel generally unwell. This is not uncommon, as chemotherapy can affect healthy cells as well as cancer cells. Report any side effects. Continue your course of treatment even though you feel ill unless yourdoctor tells you to stop. This medicine may increase your risk of getting an infection. Call your healthcare professional for advice if you get a fever, chills, or sore throat, or other symptoms of a cold or flu. Do not treat yourself. Try to avoid beingaround people who are sick. Avoid taking medicines that contain aspirin, acetaminophen, ibuprofen, naproxen, or ketoprofen unless instructed by your healthcare professional.These medicines may hide a fever. This medicine may increase your risk to bruise or bleed. Call your doctor orhealth care professional if you notice any unusual bleeding. Be careful brushing and flossing your teeth or using a toothpick because you may get an infection or bleed more easily. If you have any dental work done,tell your dentist you are receiving this medicine. Do not become pregnant while taking this medicine or for 14 months after stopping it. Women should inform their healthcare professional if they wish to become pregnant or think they might be pregnant. Men should not father a child while taking this medicine and for 11 months after stopping it. There is potential for serious side effects to an unborn child. Talk to your healthcareprofessional for more information. Do not breast-feed an infant while taking this medicine. This medicine has caused ovarian failure in some women. This medicine may make it more difficult to get pregnant. Talk to your  healthcare professional if Kristina Orozco concerned about your fertility. This medicine has caused decreased sperm counts in some men. This may make it more difficult to father a child. Talk to your healthcare professional if Kristina Orozco concerned about your fertility. Drink fluids as directed while you are taking this medicine. This will helpprotect your kidneys. Call your doctor or health care professional if you get diarrhea. Do not treatyourself. What side effects may I notice from receiving this medication? Side effects that you should report to your doctor or health care professionalas soon as possible: allergic reactions like skin rash, itching or hives, swelling of the face, lips, or tongue blurred vision changes in vision decreased hearing or ringing of the ears nausea, vomiting pain, redness, or irritation at site where injected pain, tingling, numbness in the hands or feet signs and symptoms of bleeding such as bloody or black, tarry stools; red or dark brown urine; spitting up blood or brown material that looks like coffee grounds; red spots on the skin; unusual bruising or bleeding from  the eyes, gums, or nose signs and symptoms of infection like fever; chills; cough; sore throat; pain or trouble passing urine signs and symptoms of kidney injury like trouble passing urine or change in the amount of urine signs and symptoms of low red blood cells or anemia such as unusually weak or tired; feeling faint or lightheaded; falls; breathing problems Side effects that usually do not require medical attention (report to yourdoctor or health care professional if they continue or are bothersome): loss of appetite mouth sores muscle cramps This list may not describe all possible side effects. Call your doctor for medical advice about side effects. You may report side effects to FDA at1-800-FDA-1088. Where should I keep my medication? This drug is given in a hospital or clinic and will not be stored at  home. NOTE: This sheet is a summary. It may not cover all possible information. If you have questions about this medicine, talk to your doctor, pharmacist, orhealth care provider.  2022 Elsevier/Gold Standard (2018-07-05 15:59:17)

## 2021-01-19 NOTE — Assessment & Plan Note (Addendum)
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   # proceed with palliative chemotherapy-gemcitabine and cisplatin  + Durvalumab #1. Labs today reviewed;  acceptable for treatment today.   # Abdominal distention/abdominal pain- likely malignant ascites; US/paracentsis ASAP.   # Blood glucose- [HbA1c- May 5.5; on glipizide 5 mg/day; continue.   # HTN-  continue with cozaar-STABLE  # Hypokalemia-- sec to cisplatin- improved; continue Kdur 20 meQ once a day.  # DISPOSITION: # US abdomen/ paracentesis ASAP # as planned in 1 week labs- cbc/bmp; Gem-Cis/onpro-  # follow up in 3 weeks/wed- MD: labs- cbc/cmp/mag; ca19-9;TSH;  Gem-Cis-Durvalumab; #  planned in 4 week labs- cbc/bmp; Gem-Cis/onpro- -Dr.B

## 2021-01-19 NOTE — Progress Notes (Signed)
[facility] CONSULT NOTE  Patient Care Team: Revelo, Elyse Jarvis, MD as PCP - General (Family Medicine) Rico Junker, RN as Registered Nurse Christene Lye, MD (General Surgery) Clent Jacks, RN as Oncology Nurse Navigator  CHIEF COMPLAINTS/PURPOSE OF CONSULTATION: GALLBLADDER CANCER   #  Oncology History Overview Note  10/25- GALLBLADDER CANCER- [Dr.Cannon]/Dr.Byerly- laproscopic cholecystectomy- pT Category: pT3; pN1' POSITIVE for PNI; Histologic Type: Adenocarcinoma, biliary type  Histologic Grade: G3, poorly differentiated  Tumor Size: Greatest dimension: 2.2 cm  Tumor Extent: Tumor directly invades the liver  Lymphovascular Invasion: Present pre-treatement   Tumor Site: Fundus MARGINS  Margin Status for Invasive Carcinoma:      Margin Involved by Invasive Carcinoma: Liver parenchymal / hepatic  Bed; Number of Lymph Nodes with Tumor: 1       Number of Lymph Nodes Examined: 1    # October 25th 2021- Incidental gallbladder ADENOCA-s/p acute cholecystectomy [Dr.Cannon]. Stage III- T3N1; liver margin positive.  CT chest negative for any metastatic disease.  MRI abdomen with and without contrast-residual disease noted gallbladder bed; no evidence of distant metastases or lymphadenopathy. NOv 3rd 2021-CA 19-9 > 2000;     # NEOADJUVANT CHEMO-12/15-  Gem-cis [d1 & d-8 q 21 days] ; DEC 2021- [id number].   June 14th, 2022- Dr.Byerly- METASTATIC DISEASE [. Multiple firm nodules were identified adherent to the anterior abdominal wall in both the RUQ as well as periumbilical area. These were primarily in the omentum, though one was between the liver and the abdominal wall.  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  female stage IV adenocarcinoma gallbladder is here to proceed with palliative chemotherapy-gemcitabine cisplatin-Durvalumab.  Since the last visit approximate 2 weeks ago patient is noted to have worsening swelling in the abdomen.  Feels heavy/bloated and also pain.  However oxycodone makes her feel sleepy.  Denies any nausea vomiting.  Denies any swelling in the legs.  Complains of itching around the laparoscopic port incisions.  Review of Systems  Constitutional:  Negative for chills, diaphoresis, fever and malaise/fatigue.  HENT:  Negative for nosebleeds and sore throat.   Eyes:  Negative for double vision.  Respiratory:  Negative for cough, hemoptysis, sputum production, shortness of breath and wheezing.   Cardiovascular:  Negative for chest pain, palpitations, orthopnea and leg swelling.  Gastrointestinal:  Positive for abdominal pain and constipation. Negative for blood in stool, diarrhea, heartburn, melena, nausea and vomiting.  Genitourinary:  Negative for dysuria, frequency and urgency.  Musculoskeletal:  Negative for back pain and joint pain.  Skin: Negative.  Negative for itching and rash.  Neurological:  Negative  for dizziness, tingling, focal weakness, weakness and headaches.  Endo/Heme/Allergies:  Does not bruise/bleed easily.  Psychiatric/Behavioral:  Negative for depression. The patient is not  nervous/anxious and does not have insomnia.     MEDICAL HISTORY:  Past Medical History:  Diagnosis Date  . Cancer ([place]) 05/17/2020   gallbladder  . Diabetes mellitus without complication ([place])   . History of chemotherapy    started 06/2020  . Hypertension     SURGICAL HISTORY: Past Surgical History:  Procedure Laterality Date  . ABDOMINAL HYSTERECTOMY    . APPENDECTOMY  2004  . BREAST BIOPSY Left 2011  . BREAST BIOPSY Right 08/26/13  . 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/28/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  . CHOLECYSTECTOMY N/A 05/17/2020   Procedure: LAPAROSCOPIC CHOLECYSTECTOMY;  Surgeon: Fredirick Maudlin, MD;  Location: [facility] ORS;  Service: General;  Laterality: N/A;  . COLONOSCOPY WITH PROPOFOL N/A 01/31/2018   Procedure: COLONOSCOPY WITH PROPOFOL;  Surgeon: Jonathon Bellows, MD;  Location: [facility] ENDOSCOPY;  Service: Gastroenterology;  Laterality: N/A;  . LAPAROSCOPIC LYSIS OF ADHESIONS  01/04/2021   Procedure: LAPAROSCOPIC LYSIS OF ADHESIONS;  Surgeon: Stark Klein, MD;  Location: [place];  Service: General;;  1 HOUR  . LAPAROSCOPY N/A 01/04/2021   Procedure: LAPAROSCOPY DIAGNOSTIC;  Surgeon: Stark Klein, MD;  Location: [place];  Service: General;  Laterality: N/A;  EPIDURAL  . PORTACATH PLACEMENT Right 07/02/2020   Procedure: INSERTION PORT-A-CATH, chemotherapy;  Surgeon: Fredirick Maudlin, MD;  Location: [facility] ORS;  Service: General;  Laterality: Right;    SOCIAL HISTORY: Social History   Socioeconomic History  . Marital status: Single    Spouse name: Not on file  . Number of children: Not on file  . Years of education: Not on file  . Highest education level: Not on file  Occupational History  . Occupation: special needs worker  Tobacco Use  . Smoking status: Never  . Smokeless tobacco: Never  Vaping Use  . Vaping Use: Never used  Substance and Sexual Activity  . Alcohol use: Yes    Alcohol/week: 2.0 standard drinks    Types: 2 Cans of beer per week    Comment: beer on weekend  . Drug use: No   . Sexual activity: Not Currently  Other Topics Concern  . Not on file  Social History Narrative   Lives in [place]; with son. 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 Never smoked; beer/iqour over weekends.    Social Determinants of Health   Financial Resource Strain: Not on file  Food Insecurity: Not on file  Transportation Needs: Not on file  Physical Activity: Not on file  Stress: Not on file  Social Connections: Not on file  Intimate Partner Violence: Not on file    FAMILY HISTORY: Family History  Problem Relation Age of Onset  . Pancreatic cancer Mother        AT 54 years  . Breast cancer Sister        appx-55 years  . Pancreatic cancer Maternal Grandmother     ALLERGIES:  is allergic to seasonal ic [cholestatin].  MEDICATIONS:  Current Outpatient Medications  Medication Sig Dispense Refill  . Chlorpheniramine Maleate (ALLERGY RELIEF PO) Take 1 tablet by mouth daily as needed (allergies).    . CINNAMON PO Take 1 tablet by mouth daily.    Marland Kitchen GARLIC PO Take 1 tablet by mouth daily.    Marland Kitchen glipiZIDE (GLUCOTROL) 5 MG tablet TAKE 1 TABLET BY MOUTH ONCE DAILY BEFORE BREAKFAST (Patient taking differently: Take 5 mg by  mouth daily before breakfast.) 30 tablet 0  . lidocaine-prilocaine (EMLA) cream Apply 1 application topically as needed. Apply on the port 30-45 mins prior to port access 30 g 1  . losartan (COZAAR) 25 MG tablet Take 25 mg by mouth every morning.     . Multiple Vitamins-Minerals (MULTIVITAMIN WITH MINERALS) tablet Take 1 tablet by mouth daily.    . ondansetron (ZOFRAN) 8 MG tablet One pill every 8 hours as needed for nausea/vomitting. 40 tablet 1  . oxyCODONE (OXY IR/ROXICODONE) 5 MG immediate release tablet Take 1-2 tablets (5-10 mg total) by mouth every 6 (six) hours as needed for severe pain. 60 tablet 0  . pravastatin (PRAVACHOL) 10 MG tablet Take 10 mg by mouth 4 (four) times a week.    . prochlorperazine (COMPAZINE) 10 MG tablet Take 1  tablet (10 mg total) by mouth every 6 (six) hours as needed for nausea or vomiting. 40 tablet 1   No current facility-administered medications for this visit.   Facility-Administered Medications Ordered in Other Visits  Medication Dose Route Frequency Provider Last Rate Last Admin  . CISplatin (PLATINOL) 53 mg in sodium chloride 0.9 % 250 mL chemo infusion  25 mg/m2 (Treatment Plan Recorded) Intravenous Once Cammie Sickle, MD      . dexamethasone (DECADRON) 10 mg in sodium chloride 0.9 % 50 mL IVPB  10 mg Intravenous Once Cammie Sickle, MD      . durvalumab (IMFINZI) 1,500 mg in sodium chloride 0.9 % 100 mL chemo infusion  1,500 mg Intravenous Once Charlaine Dalton R, MD      . fosaprepitant (EMEND) 150 mg in sodium chloride 0.9 % 145 mL IVPB  150 mg Intravenous Once Charlaine Dalton R, MD      . gemcitabine (GEMZAR) 2,000 mg in sodium chloride 0.9 % 250 mL chemo infusion  2,000 mg Intravenous Once Charlaine Dalton R, MD      . heparin lock flush 100 unit/mL  500 Units Intravenous Once Charlaine Dalton R, MD      . heparin lock flush 100 unit/mL  500 Units Intracatheter Once PRN Charlaine Dalton R, MD      . magnesium sulfate IVPB 2 g 50 mL  2 g Intravenous Once Cammie Sickle, MD 50 mL/hr at 01/19/21 1015 2 g at 01/19/21 1015  . palonosetron (ALOXI) injection 0.25 mg  0.25 mg Intravenous Once Charlaine Dalton R, MD      . sodium chloride flush (NS) 0.9 % injection 10 mL  10 mL Intravenous Once Charlaine Dalton R, MD      . sodium chloride flush (NS) 0.9 % injection 10 mL  10 mL Intracatheter PRN Cammie Sickle, MD          .  PHYSICAL EXAMINATION: ECOG PERFORMANCE STATUS: 0 - Asymptomatic  Vitals:   01/19/21 0904  BP: (!) 136/54  Pulse: (!) 112  Resp: 18  Temp: 98.5 F (36.9 C)  SpO2: 97%   Filed Weights   01/19/21 0904  Weight: 226 lb (102.5 kg)    Physical Exam HENT:     Head: Normocephalic and atraumatic.      Mouth/Throat:     Pharynx: No oropharyngeal exudate.  Eyes:     Pupils: Pupils are equal, round, and reactive to light.  Cardiovascular:     Rate and Rhythm: Normal rate and regular rhythm.  Pulmonary:     Effort: Pulmonary effort is normal. No respiratory distress.     Breath sounds: Normal breath  sounds. No wheezing.  Abdominal:     General: Bowel sounds are normal. There is distension.     Palpations: Abdomen is soft. There is no mass.     Tenderness: There is no abdominal tenderness. There is no guarding or rebound.  Musculoskeletal:        General: No tenderness. Normal range of motion.     Cervical back: Normal range of motion and neck supple.  Skin:    General: Skin is warm.  Neurological:     Mental Status: She is alert and oriented to person, place, and time.  Psychiatric:        Mood and Affect: Affect normal.     LABORATORY DATA:  I have reviewed the data as listed Lab Results  Component Value Date   WBC 17.6 (H) 01/19/2021   HGB 10.6 (L) 01/19/2021   HCT 34.8 (L) 01/19/2021   MCV 90.2 01/19/2021   PLT 345 01/19/2021   Recent Labs    12/29/20 0945 01/06/21 1316 01/19/21 0822  NA 141 139 136  K 4.0 3.7 3.4*  CL 101 101 102  CO2 '29 30 26  ' GLUCOSE 95 124* 138*  BUN '11 8 20  ' CREATININE 0.76 0.68 0.85  CALCIUM 9.5 9.0 8.4*  GFRNONAA >60 >60 >60  PROT 7.2 7.3 6.4*  ALBUMIN 3.8 3.6 3.2*  AST '22 30 17  ' ALT '20 18 12  ' ALKPHOS 57 65 61  BILITOT 0.6 0.4 0.6    RADIOGRAPHIC STUDIES: I have personally reviewed the radiological images as listed and agreed with the findings in the report. No results found.   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   # proceed with palliative chemotherapy-gemcitabine and cisplatin  + Durvalumab #1. Labs today reviewed;  acceptable for  treatment today.   # Abdominal distention/abdominal pain- likely malignant ascites; US/paracentsis ASAP.   # Blood glucose- [HbA1c- May 5.5; on glipizide 5 mg/day; continue.   # HTN-  continue with cozaar-STABLE  # Hypokalemia-- sec to cisplatin- improved; continue Kdur 20 meQ once a day.  # DISPOSITION: # US abdomen/ paracentesis ASAP # as planned in 1 week labs- cbc/bmp; Gem-Cis/onpro-  # follow up in 3 weeks/wed- MD: labs- cbc/cmp/mag; ca19-9;TSH;  Gem-Cis-Durvalumab; #  planned in 4 week labs- cbc/bmp; Gem-Cis/onpro- -Dr.B   All questions were answered. The patient knows to call the clinic with any problems, questions or concerns.   Cammie Sickle, MD 01/19/2021 10:42 AM

## 2021-01-19 NOTE — Progress Notes (Signed)
Pre V.O.of Dr.B it is ok to treat with HR of 112

## 2021-01-20 ENCOUNTER — Ambulatory Visit: Payer: Medicare Other | Admitting: Internal Medicine

## 2021-01-20 ENCOUNTER — Other Ambulatory Visit: Payer: Self-pay

## 2021-01-20 ENCOUNTER — Other Ambulatory Visit: Payer: Medicare Other

## 2021-01-20 ENCOUNTER — Ambulatory Visit: Payer: Medicare Other

## 2021-01-20 ENCOUNTER — Ambulatory Visit
Admission: RE | Admit: 2021-01-20 | Discharge: 2021-01-20 | Disposition: A | Discharge status: Home / Self Care | Payer: Medicare Other | Source: Ambulatory Visit | Attending: Internal Medicine | Admitting: Internal Medicine

## 2021-01-20 DIAGNOSIS — R180 Malignant ascites: Secondary | ICD-10-CM

## 2021-01-20 DIAGNOSIS — C23 Malignant neoplasm of gallbladder: Secondary | ICD-10-CM

## 2021-01-20 LAB — BODY FLUID CELL COUNT WITH DIFFERENTIAL
Eos, Fluid: 1 %
Lymphs, Fluid: 23 %
Monocyte-Macrophage-Serous Fluid: 65 %
Neutrophil Count, Fluid: 10 %
Other Cells, Fluid: 1 %
Total Nucleated Cell Count, Fluid: 1730 cu mm

## 2021-01-20 LAB — LACTATE DEHYDROGENASE, PLEURAL OR PERITONEAL FLUID: LD, Fluid: 145 U/L — ABNORMAL HIGH (ref 3–23)

## 2021-01-20 LAB — CA 19-9 (SERIAL): CA 19-9: 2639 U/mL — ABNORMAL HIGH (ref 0–35)

## 2021-01-20 LAB — PATHOLOGIST SMEAR REVIEW

## 2021-01-20 IMAGING — US US PARACENTESIS
1 series · 9 of 9 positions shown · non-contrast
Comparison: none

INDICATION: History of gallbladder carcinoma and increased abdominal distension
concerning for ascites clinically.

[Series 1: us paracentesis · 0.25mm/px · 9 of 9 slices shown]
[im 1/9]
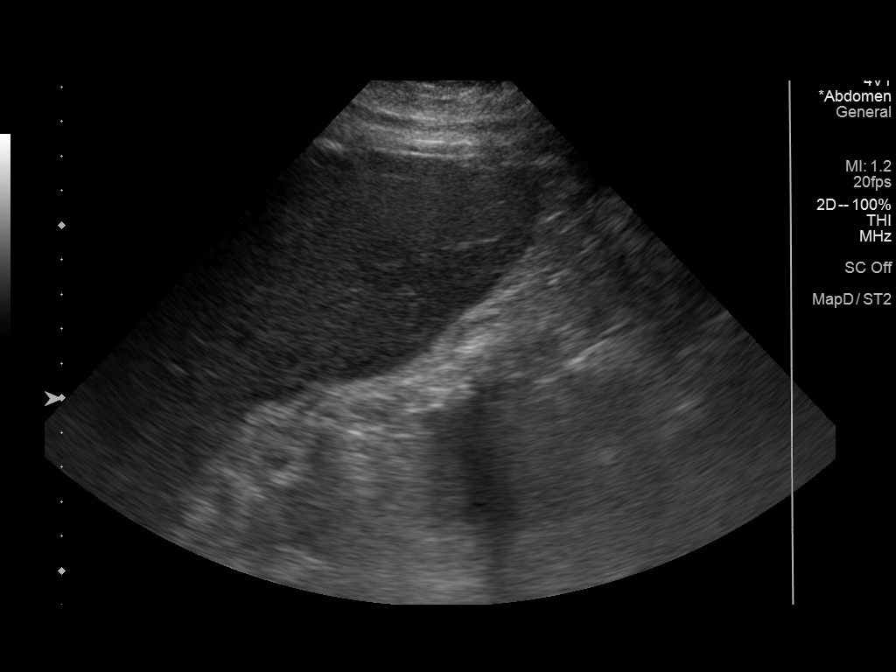
[im 2/9]
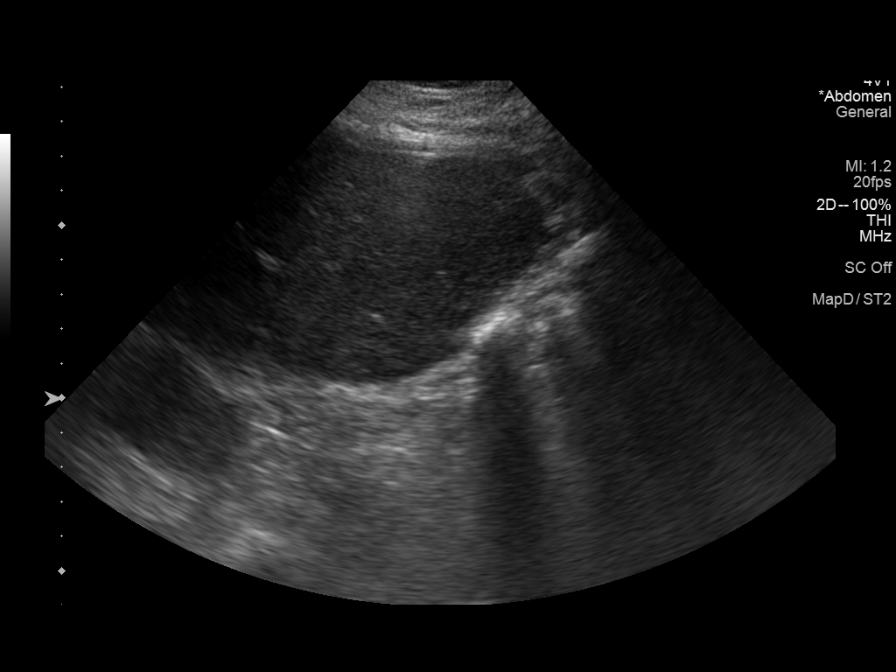
[im 3/9]
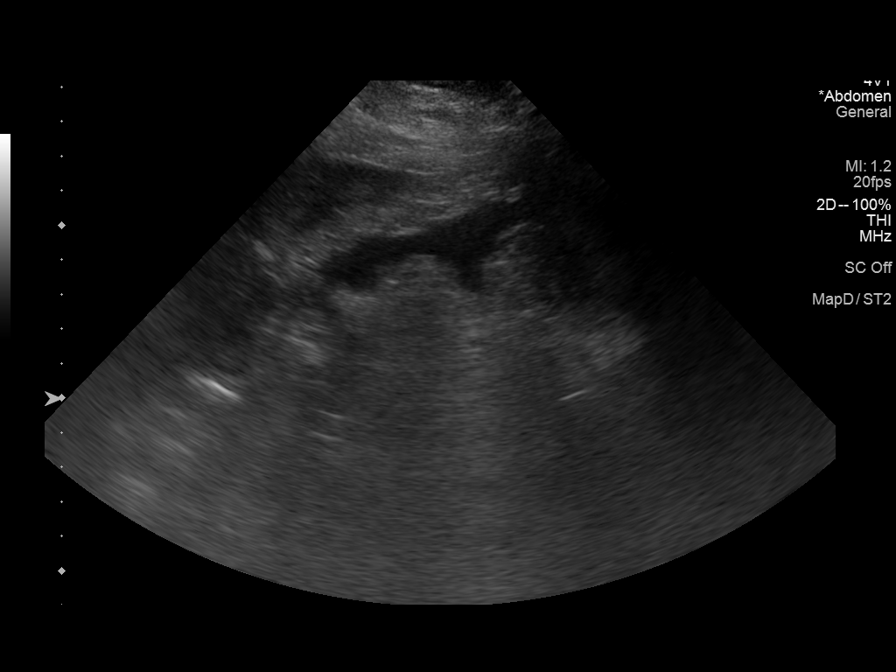
[im 4/9]
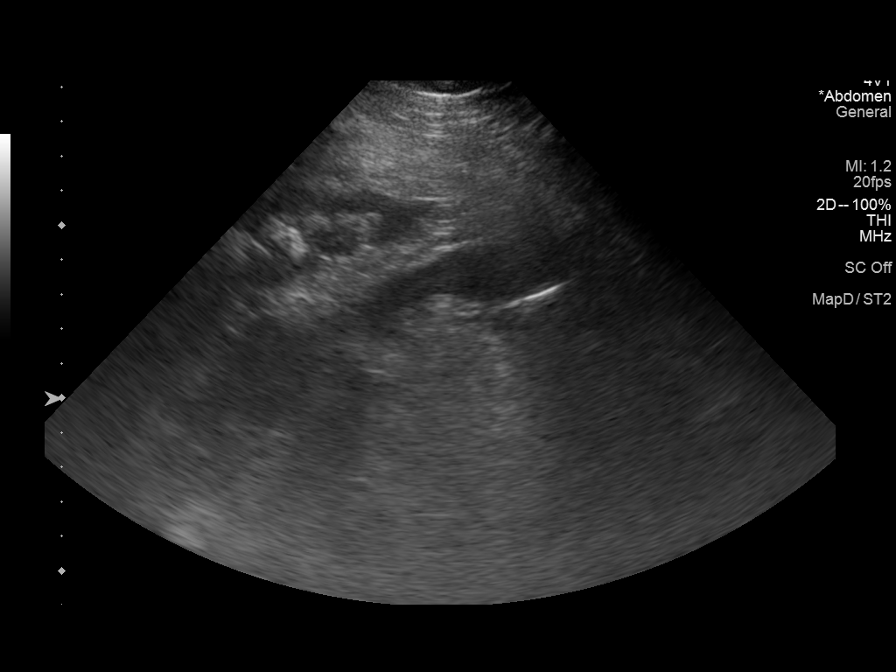
[im 5/9]
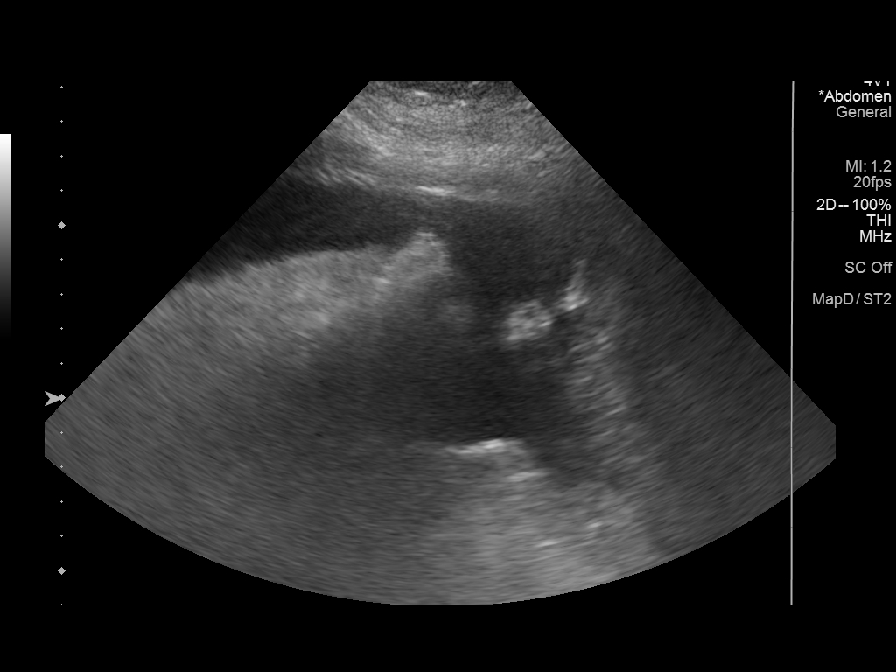
[im 6/9]
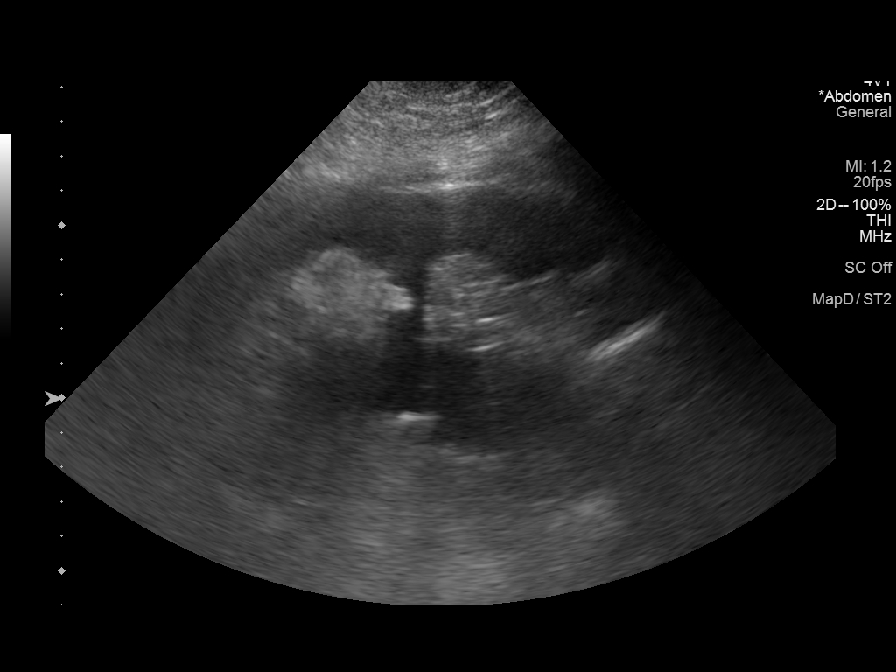
[im 7/9]
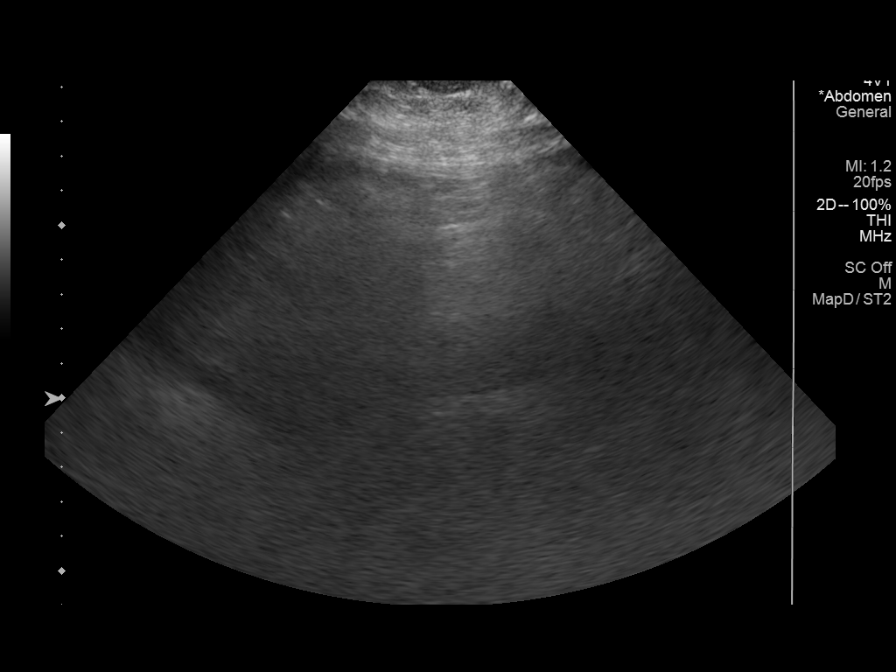
[im 8/9]
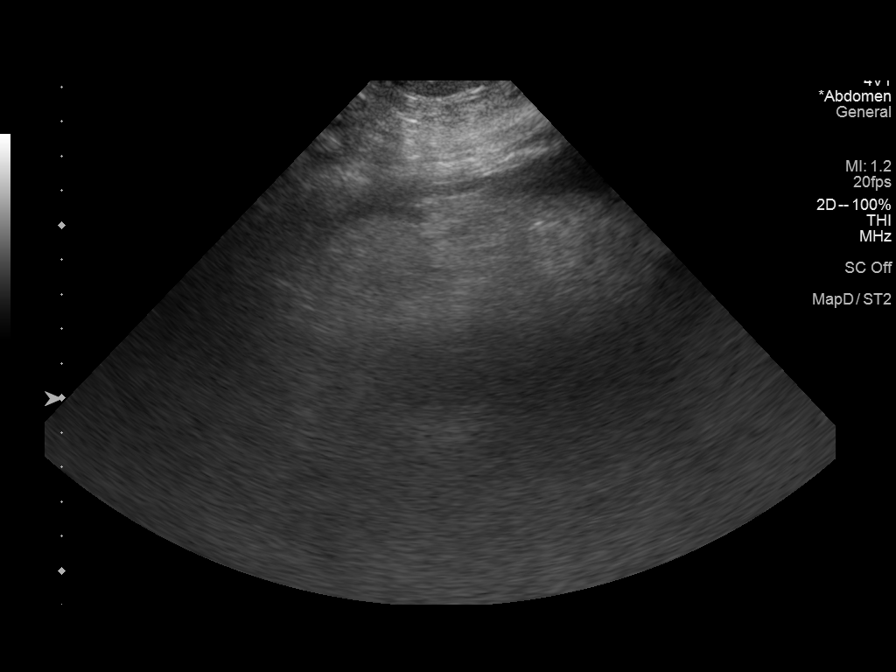
[im 9/9]
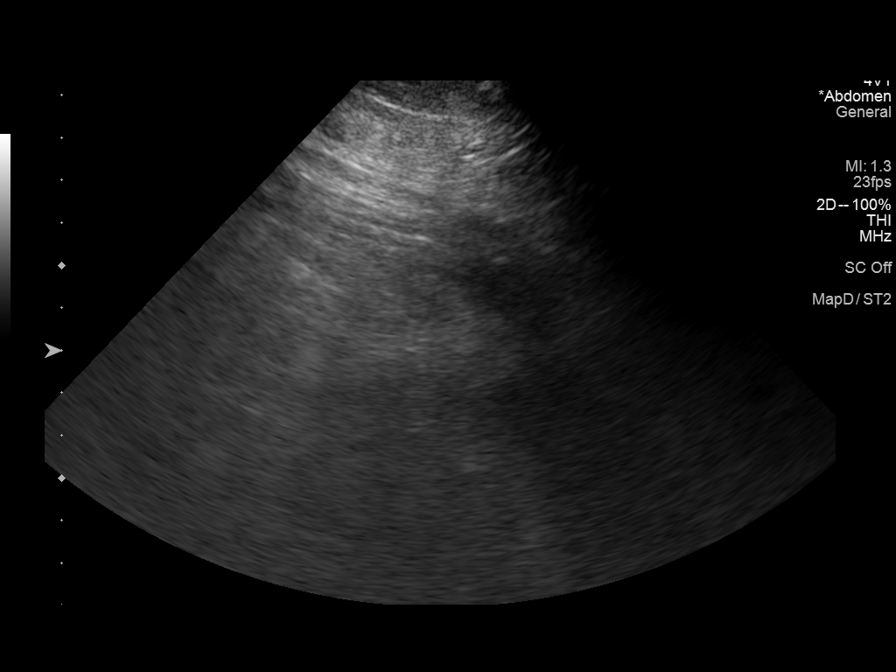

[9 of 9 positions shown; findings below may reference images not displayed]

EXAM:
ULTRASOUND GUIDED PARACENTESIS

MEDICATIONS:
None.

COMPLICATIONS:
None immediate.

PROCEDURE:
Informed written consent was obtained from the patient after a
discussion of the risks, benefits and alternatives to treatment. A
timeout was performed prior to the initiation of the procedure.

Initial ultrasound was performed to localize ascites. The left
lateral abdominal wall was prepped and draped in the usual sterile
fashion. 1% lidocaine was used for local anesthesia.

Following this, a 6 Fr Safe-T-Centesis catheter was introduced. An
ultrasound image was saved for documentation purposes. The
paracentesis was performed. The catheter was removed and a dressing
was applied. The patient tolerated the procedure well without
immediate post procedural complication.
FINDINGS: A total of approximately 2.7 L of yellowish fluid was removed. Fluid
was sent for requested laboratory tests.
IMPRESSION: Successful ultrasound-guided paracentesis yielding 2.7 liters of
peritoneal fluid.

## 2021-01-21 ENCOUNTER — Telehealth: Payer: Self-pay

## 2021-01-21 ENCOUNTER — Telehealth: Payer: Self-pay | Admitting: *Deleted

## 2021-01-21 NOTE — Telephone Encounter (Signed)
Patient has questions regarding legs swelling then going down overnight after having fluid drawn off yesterday.

## 2021-01-21 NOTE — Telephone Encounter (Signed)
Contacted pt and let her know there is not much to do about the leg swelling at this time since they have went down since overnight per Dr. Jacinto Reap. Keep an eye out for any worsening symptoms, as well as keep legs elevated. If anything changes, informed pt to contact cancer center. Pt agrees and understands.

## 2021-01-21 NOTE — Telephone Encounter (Signed)
fluid taken off yesterday morning, both legs were swollen a bit, went down overnight; not painful at all; did not try elevating her legs; when she got home from her appt pt took a nap with both feet flat on the floor during the whole nap.

## 2021-01-23 LAB — BODY FLUID CULTURE W GRAM STAIN: Culture: NO GROWTH

## 2021-01-25 ENCOUNTER — Telehealth: Payer: Self-pay | Admitting: Internal Medicine

## 2021-01-25 NOTE — Telephone Encounter (Signed)
On 07/05- I called to check on pt leg swelling better/ follow up as planned. GB

## 2021-01-26 ENCOUNTER — Inpatient Hospital Stay: Payer: Medicare Other | Attending: Internal Medicine

## 2021-01-26 ENCOUNTER — Inpatient Hospital Stay: Payer: Medicare Other

## 2021-01-26 VITALS — BP 137/56 | HR 93 | Temp 97.0°F | Resp 16 | Wt 217.2 lb

## 2021-01-26 DIAGNOSIS — C787 Secondary malignant neoplasm of liver and intrahepatic bile duct: Secondary | ICD-10-CM | POA: Insufficient documentation

## 2021-01-26 DIAGNOSIS — Z79899 Other long term (current) drug therapy: Secondary | ICD-10-CM | POA: Diagnosis not present

## 2021-01-26 DIAGNOSIS — Z5189 Encounter for other specified aftercare: Secondary | ICD-10-CM | POA: Insufficient documentation

## 2021-01-26 DIAGNOSIS — Z5111 Encounter for antineoplastic chemotherapy: Secondary | ICD-10-CM | POA: Insufficient documentation

## 2021-01-26 DIAGNOSIS — C23 Malignant neoplasm of gallbladder: Secondary | ICD-10-CM | POA: Diagnosis present

## 2021-01-26 DIAGNOSIS — E876 Hypokalemia: Secondary | ICD-10-CM | POA: Diagnosis not present

## 2021-01-26 DIAGNOSIS — Z95828 Presence of other vascular implants and grafts: Secondary | ICD-10-CM

## 2021-01-26 LAB — CBC WITH DIFFERENTIAL/PLATELET
Abs Immature Granulocytes: 0.03 10*3/uL (ref 0.00–0.07)
Basophils Absolute: 0 10*3/uL (ref 0.0–0.1)
Basophils Relative: 0 %
Eosinophils Absolute: 0.1 10*3/uL (ref 0.0–0.5)
Eosinophils Relative: 2 %
HCT: 30.5 % — ABNORMAL LOW (ref 36.0–46.0)
Hemoglobin: 9.5 g/dL — ABNORMAL LOW (ref 12.0–15.0)
Immature Granulocytes: 1 %
Lymphocytes Relative: 32 %
Lymphs Abs: 1.6 10*3/uL (ref 0.7–4.0)
MCH: 26.8 pg (ref 26.0–34.0)
MCHC: 31.1 g/dL (ref 30.0–36.0)
MCV: 86.2 fL (ref 80.0–100.0)
Monocytes Absolute: 0.3 10*3/uL (ref 0.1–1.0)
Monocytes Relative: 6 %
Neutro Abs: 3 10*3/uL (ref 1.7–7.7)
Neutrophils Relative %: 59 %
Platelets: 204 10*3/uL (ref 150–400)
RBC: 3.54 MIL/uL — ABNORMAL LOW (ref 3.87–5.11)
RDW: 14.6 % (ref 11.5–15.5)
WBC: 5 10*3/uL (ref 4.0–10.5)
nRBC: 0 % (ref 0.0–0.2)

## 2021-01-26 LAB — COMPREHENSIVE METABOLIC PANEL
ALT: 25 U/L (ref 0–44)
AST: 30 U/L (ref 15–41)
Albumin: 3.1 g/dL — ABNORMAL LOW (ref 3.5–5.0)
Alkaline Phosphatase: 102 U/L (ref 38–126)
Anion gap: 9 (ref 5–15)
BUN: 12 mg/dL (ref 8–23)
CO2: 28 mmol/L (ref 22–32)
Calcium: 8.4 mg/dL — ABNORMAL LOW (ref 8.9–10.3)
Chloride: 102 mmol/L (ref 98–111)
Creatinine, Ser: 0.79 mg/dL (ref 0.44–1.00)
GFR, Estimated: >60 mL/min (ref >60)
Glucose, Bld: 143 mg/dL — ABNORMAL HIGH (ref 70–99)
Potassium: 3.9 mmol/L (ref 3.5–5.1)
Sodium: 139 mmol/L (ref 135–145)
Total Bilirubin: 0.5 mg/dL (ref 0.3–1.2)
Total Protein: 6.6 g/dL (ref 6.5–8.1)

## 2021-01-26 LAB — MAGNESIUM: Magnesium: 1.8 mg/dL (ref 1.7–2.4)

## 2021-01-26 MED ORDER — MAGNESIUM SULFATE 2 GM/50ML IV SOLN
2.0000 g | Freq: Once | INTRAVENOUS | Status: AC
  Administered 2021-01-26: 2 g via INTRAVENOUS
  Filled 2021-01-26: qty 50

## 2021-01-26 MED ORDER — HEPARIN SOD (PORK) LOCK FLUSH 100 UNIT/ML IV SOLN
INTRAVENOUS | Status: AC
  Filled 2021-01-26: qty 5

## 2021-01-26 MED ORDER — SODIUM CHLORIDE 0.9 % IV SOLN
Freq: Once | INTRAVENOUS | Status: AC
  Filled 2021-01-26: qty 250

## 2021-01-26 MED ORDER — PALONOSETRON HCL INJECTION 0.25 MG/5ML
0.2500 mg | Freq: Once | INTRAVENOUS | Status: AC
  Administered 2021-01-26: 0.25 mg via INTRAVENOUS
  Filled 2021-01-26: qty 5

## 2021-01-26 MED ORDER — SODIUM CHLORIDE 0.9 % IV SOLN
150.0000 mg | Freq: Once | INTRAVENOUS | Status: AC
  Administered 2021-01-26: 150 mg via INTRAVENOUS
  Filled 2021-01-26: qty 150

## 2021-01-26 MED ORDER — PEGFILGRASTIM 6 MG/0.6ML ~~LOC~~ PSKT
6.0000 mg | PREFILLED_SYRINGE | Freq: Once | SUBCUTANEOUS | Status: AC
  Administered 2021-01-26: 6 mg via SUBCUTANEOUS
  Filled 2021-01-26: qty 0.6

## 2021-01-26 MED ORDER — POTASSIUM CHLORIDE IN NACL 20-0.9 MEQ/L-% IV SOLN
Freq: Once | INTRAVENOUS | Status: AC
  Filled 2021-01-26: qty 1000

## 2021-01-26 MED ORDER — HEPARIN SOD (PORK) LOCK FLUSH 100 UNIT/ML IV SOLN
500.0000 [IU] | Freq: Once | INTRAVENOUS | Status: AC
  Administered 2021-01-26: 500 [IU] via INTRAVENOUS
  Filled 2021-01-26: qty 5

## 2021-01-26 MED ORDER — SODIUM CHLORIDE 0.9 % IV SOLN
2000.0000 mg | Freq: Once | INTRAVENOUS | Status: AC
  Administered 2021-01-26: 2000 mg via INTRAVENOUS
  Filled 2021-01-26: qty 52.6

## 2021-01-26 MED ORDER — HEPARIN SOD (PORK) LOCK FLUSH 100 UNIT/ML IV SOLN
500.0000 [IU] | Freq: Once | INTRAVENOUS | Status: DC | PRN
  Filled 2021-01-26: qty 5

## 2021-01-26 MED ORDER — SODIUM CHLORIDE 0.9 % IV SOLN
25.0000 mg/m2 | Freq: Once | INTRAVENOUS | Status: AC
  Administered 2021-01-26: 53 mg via INTRAVENOUS
  Filled 2021-01-26: qty 53

## 2021-01-26 MED ORDER — SODIUM CHLORIDE 0.9% FLUSH
10.0000 mL | Freq: Once | INTRAVENOUS | Status: AC
Start: 2021-01-26 — End: 2021-01-26
  Administered 2021-01-26: 10 mL via INTRAVENOUS
  Filled 2021-01-26: qty 10

## 2021-01-26 MED ORDER — SODIUM CHLORIDE 0.9 % IV SOLN
10.0000 mg | Freq: Once | INTRAVENOUS | Status: AC
  Administered 2021-01-26: 10 mg via INTRAVENOUS
  Filled 2021-01-26: qty 10

## 2021-01-26 MED ORDER — SODIUM CHLORIDE 0.9% FLUSH
10.0000 mL | INTRAVENOUS | Status: DC | PRN
  Administered 2021-01-26: 10 mL
  Filled 2021-01-26: qty 10

## 2021-01-26 NOTE — Patient Instructions (Addendum)
[facility]  Discharge Instructions: Thank you for choosing [facility] to provide your oncology and hematology care.  If you have a lab appointment with the [facility], please go directly to the [facility] and check in at the registration area.  Wear comfortable clothing and clothing appropriate for easy access to any Portacath or PICC line.   We strive to give you quality time with your provider. You may need to reschedule your appointment if you arrive late (15 or more minutes).  Arriving late affects you and other patients whose appointments are after yours.  Also, if you miss three or more appointments without notifying the office, you may be dismissed from the clinic at the provider's discretion.      For prescription refill requests, have your pharmacy contact our office and allow 72 hours for refills to be completed.    Today you received the following chemotherapy and/or immunotherapy agents:Gemcitibine and CisplatinCisplatin injection, NeuslastaPegfilgrastim injection What is this medication? PEGFILGRASTIM (PEG fil gra stim) is a long-acting granulocyte colony-stimulating factor that stimulates the growth of neutrophils, a type of white blood cell important in the body's fight against infection. It is used to reduce the incidence of fever and infection in patients with certain types of cancer who are receiving chemotherapy that affects the bone marrow, and toincrease survival after being exposed to high doses of radiation. This medicine may be used for other purposes; ask your health care provider orpharmacist if you have questions. COMMON BRAND NAME(S): Rexene Edison, Ziextenzo What should I tell my care team before I take this medication? They need to know if you have any of these conditions: kidney disease latex allergy ongoing radiation therapy sickle cell disease skin reactions to acrylic adhesives  (On-Body Injector only) an unusual or allergic reaction to pegfilgrastim, filgrastim, other medicines, foods, dyes, or preservatives pregnant or trying to get pregnant breast-feeding How should I use this medication? This medicine is for injection under the skin. If you get this medicine at home, you will be taught how to prepare and give the pre-filled syringe or how to use the On-body Injector. Refer to the patient Instructions for Use for detailed instructions. Use exactly as directed. Tell your healthcare provider immediately if you suspect that the On-body Injector may not have performed as intended or if you suspect the use of the On-body Injector resulted in a missedor partial dose. It is important that you put your used needles and syringes in a special sharps container. Do not put them in a trash can. If you do not have a sharpscontainer, call your pharmacist or healthcare provider to get one. Talk to your pediatrician regarding the use of this medicine in children. Whilethis drug may be prescribed for selected conditions, precautions do apply. Overdosage: If you think you have taken too much of this medicine contact apoison control center or emergency room at once. NOTE: This medicine is only for you. Do not share this medicine with others. What if I miss a dose? It is important not to miss your dose. Call your doctor or health care professional if you miss your dose. If you miss a dose due to an On-body Injector failure or leakage, a new dose should be administered as soon aspossible using a single prefilled syringe for manual use. What may interact with this medication? Interactions have not been studied. This list may not describe all possible interactions. Give your health care provider a list of  all the medicines, herbs, non-prescription drugs, or dietary supplements you use. Also tell them if you smoke, drink alcohol, or use illegaldrugs. Some items may interact with your  medicine. What should I watch for while using this medication? Your condition will be monitored carefully while you are receiving thismedicine. You may need blood work done while you are taking this medicine. Talk to your health care provider about your risk of cancer. You may be more atrisk for certain types of cancer if you take this medicine. If you are going to need a MRI, CT scan, or other procedure, tell your doctorthat you are using this medicine (On-Body Injector only). What side effects may I notice from receiving this medication? Side effects that you should report to your doctor or health care professionalas soon as possible: allergic reactions (skin rash, itching or hives, swelling of the face, lips, or tongue) back pain dizziness fever pain, redness, or irritation at site where injected pinpoint red spots on the skin red or dark-brown urine shortness of breath or breathing problems stomach or side pain, or pain at the shoulder swelling tiredness trouble passing urine or change in the amount of urine unusual bruising or bleeding Side effects that usually do not require medical attention (report to yourdoctor or health care professional if they continue or are bothersome): bone pain muscle pain This list may not describe all possible side effects. Call your doctor for medical advice about side effects. You may report side effects to FDA at1-800-FDA-1088. Where should I keep my medication? Keep out of the reach of children. If you are using this medicine at home, you will be instructed on how to storeit. Throw away any unused medicine after the expiration date on the label. NOTE: This sheet is a summary. It may not cover all possible information. If you have questions about this medicine, talk to your doctor, pharmacist, orhealth care provider.  2022 Elsevier/Gold Standard (2020-08-06 11:54:14)  What is this medication? CISPLATIN (SIS pla tin) is a chemotherapy drug. It  targets fast dividing cells, like cancer cells, and causes these cells to die. This medicine is used totreat many types of cancer like bladder, ovarian, and testicular cancers. This medicine may be used for other purposes; ask your health care provider orpharmacist if you have questions. COMMON BRAND NAME(S): Platinol, Platinol -AQ What should I tell my care team before I take this medication? They need to know if you have any of these conditions: eye disease, vision problems hearing problems kidney disease low blood counts, like white cells, platelets, or red blood cells tingling of the fingers or toes, or other nerve disorder an unusual or allergic reaction to cisplatin, carboplatin, oxaliplatin, other medicines, foods, dyes, or preservatives pregnant or trying to get pregnant breast-feeding How should I use this medication? This drug is given as an infusion into a vein. It is administered in a hospitalor clinic by a specially trained health care professional. Talk to your pediatrician regarding the use of this medicine in children.Special care may be needed. Overdosage: If you think you have taken too much of this medicine contact apoison control center or emergency room at once. NOTE: This medicine is only for you. Do not share this medicine with others. What if I miss a dose? It is important not to miss a dose. Call your doctor or health careprofessional if you are unable to keep an appointment. What may interact with this medication? This medicine may interact with the following medications: foscarnet certain antibiotics like  amikacin, gentamicin, neomycin, polymyxin B, streptomycin, tobramycin, vancomycin This list may not describe all possible interactions. Give your health care provider a list of all the medicines, herbs, non-prescription drugs, or dietary supplements you use. Also tell them if you smoke, drink alcohol, or use illegaldrugs. Some items may interact with your  medicine. What should I watch for while using this medication? Your condition will be monitored carefully while you are receiving this medicine. You will need important blood work done while you are taking thismedicine. This drug may make you feel generally unwell. This is not uncommon, as chemotherapy can affect healthy cells as well as cancer cells. Report any side effects. Continue your course of treatment even though you feel ill unless yourdoctor tells you to stop. This medicine may increase your risk of getting an infection. Call your healthcare professional for advice if you get a fever, chills, or sore throat, or other symptoms of a cold or flu. Do not treat yourself. Try to avoid beingaround people who are sick. Avoid taking medicines that contain aspirin, acetaminophen, ibuprofen, naproxen, or ketoprofen unless instructed by your healthcare professional.These medicines may hide a fever. This medicine may increase your risk to bruise or bleed. Call your doctor orhealth care professional if you notice any unusual bleeding. Be careful brushing and flossing your teeth or using a toothpick because you may get an infection or bleed more easily. If you have any dental work done,tell your dentist you are receiving this medicine. Do not become pregnant while taking this medicine or for 14 months after stopping it. Women should inform their healthcare professional if they wish to become pregnant or think they might be pregnant. Men should not father a child while taking this medicine and for 11 months after stopping it. There is potential for serious side effects to an unborn child. Talk to your healthcareprofessional for more information. Do not breast-feed an infant while taking this medicine. This medicine has caused ovarian failure in some women. This medicine may make it more difficult to get pregnant. Talk to your healthcare professional if [place] concerned about your fertility. This medicine has  caused decreased sperm counts in some men. This may make it more difficult to father a child. Talk to your healthcare professional if [place] concerned about your fertility. Drink fluids as directed while you are taking this medicine. This will helpprotect your kidneys. Call your doctor or health care professional if you get diarrhea. Do not treatyourself. What side effects may I notice from receiving this medication? Side effects that you should report to your doctor or health care professionalas soon as possible: allergic reactions like skin rash, itching or hives, swelling of the face, lips, or tongue blurred vision changes in vision decreased hearing or ringing of the ears nausea, vomiting pain, redness, or irritation at site where injected pain, tingling, numbness in the hands or feet signs and symptoms of bleeding such as bloody or black, tarry stools; red or dark brown urine; spitting up blood or brown material that looks like coffee grounds; red spots on the skin; unusual bruising or bleeding from the eyes, gums, or nose signs and symptoms of infection like fever; chills; cough; sore throat; pain or trouble passing urine signs and symptoms of kidney injury like trouble passing urine or change in the amount of urine signs and symptoms of low red blood cells or anemia such as unusually weak or tired; feeling faint or lightheaded; falls; breathing problems Side effects that usually do not require medical  attention (report to yourdoctor or health care professional if they continue or are bothersome): loss of appetite mouth sores muscle cramps This list may not describe all possible side effects. Call your doctor for medical advice about side effects. You may report side effects to FDA at1-800-FDA-1088. Where should I keep my medication? This drug is given in a hospital or clinic and will not be stored at home. NOTE: This sheet is a summary. It may not cover all possible information. If you  have questions about this medicine, talk to your doctor, pharmacist, orhealth care provider.  2022 Elsevier/Gold Standard (2018-07-05 15:59:17) Gemcitabine injection What is this medication? GEMCITABINE (jem SYE ta been) is a chemotherapy drug. This medicine is used to treat many types of cancer like breast cancer, lung cancer, pancreatic cancer,and ovarian cancer. This medicine may be used for other purposes; ask your health care provider orpharmacist if you have questions. COMMON BRAND NAME(S): Gemzar, Infugem What should I tell my care team before I take this medication? They need to know if you have any of these conditions: blood disorders infection kidney disease liver disease lung or breathing disease, like asthma recent or ongoing radiation therapy an unusual or allergic reaction to gemcitabine, other chemotherapy, other medicines, foods, dyes, or preservatives pregnant or trying to get pregnant breast-feeding How should I use this medication? This drug is given as an infusion into a vein. It is administered in a hospitalor clinic by a specially trained health care professional. Talk to your pediatrician regarding the use of this medicine in children.Special care may be needed. Overdosage: If you think you have taken too much of this medicine contact apoison control center or emergency room at once. NOTE: This medicine is only for you. Do not share this medicine with others. What if I miss a dose? It is important not to miss your dose. Call your doctor or health careprofessional if you are unable to keep an appointment. What may interact with this medication? medicines to increase blood counts like filgrastim, pegfilgrastim, sargramostim some other chemotherapy drugs like cisplatin vaccines Talk to your doctor or health care professional before taking any of thesemedicines: acetaminophen aspirin ibuprofen ketoprofen naproxen This list may not describe all possible  interactions. Give your health care provider a list of all the medicines, herbs, non-prescription drugs, or dietary supplements you use. Also tell them if you smoke, drink alcohol, or use illegaldrugs. Some items may interact with your medicine. What should I watch for while using this medication? Visit your doctor for checks on your progress. This drug may make you feel generally unwell. This is not uncommon, as chemotherapy can affect healthy cells as well as cancer cells. Report any side effects. Continue your course oftreatment even though you feel ill unless your doctor tells you to stop. In some cases, you may be given additional medicines to help with side effects.Follow all directions for their use. Call your doctor or health care professional for advice if you get a fever, chills or sore throat, or other symptoms of a cold or flu. Do not treat yourself. This drug decreases your body's ability to fight infections. Try toavoid being around people who are sick. This medicine may increase your risk to bruise or bleed. Call your doctor orhealth care professional if you notice any unusual bleeding. Be careful brushing and flossing your teeth or using a toothpick because you may get an infection or bleed more easily. If you have any dental work done,tell your dentist you are receiving  this medicine. Avoid taking products that contain aspirin, acetaminophen, ibuprofen, naproxen, or ketoprofen unless instructed by your doctor. These medicines may hide afever. Do not become pregnant while taking this medicine or for 6 months after stopping it. Women should inform their doctor if they wish to become pregnant or think they might be pregnant. Men should not father a child while taking this medicine and for 3 months after stopping it. There is a potential for serious side effects to an unborn child. Talk to your health care professional or pharmacist for more information. Do not breast-feed an infant while  takingthis medicine or for at least 1 week after stopping it. Men should inform their doctors if they wish to father a child. This medicine may lower sperm counts. Talk with your doctor or health care professional ifyou are concerned about your fertility. What side effects may I notice from receiving this medication? Side effects that you should report to your doctor or health care professionalas soon as possible: allergic reactions like skin rash, itching or hives, swelling of the face, lips, or tongue breathing problems pain, redness, or irritation at site where injected signs and symptoms of a dangerous change in heartbeat or heart rhythm like chest pain; dizziness; fast or irregular heartbeat; palpitations; feeling faint or lightheaded, falls; breathing problems signs of decreased platelets or bleeding - bruising, pinpoint red spots on the skin, black, tarry stools, blood in the urine signs of decreased red blood cells - unusually weak or tired, feeling faint or lightheaded, falls signs of infection - fever or chills, cough, sore throat, pain or difficulty passing urine signs and symptoms of kidney injury like trouble passing urine or change in the amount of urine signs and symptoms of liver injury like dark yellow or brown urine; general ill feeling or flu-like symptoms; light-colored stools; loss of appetite; nausea; right upper belly pain; unusually weak or tired; yellowing of the eyes or skin swelling of ankles, feet, hands Side effects that usually do not require medical attention (report to yourdoctor or health care professional if they continue or are bothersome): constipation diarrhea hair loss loss of appetite nausea rash vomiting This list may not describe all possible side effects. Call your doctor for medical advice about side effects. You may report side effects to FDA at1-800-FDA-1088. Where should I keep my medication? This drug is given in a hospital or clinic and will not  be stored at home. NOTE: This sheet is a summary. It may not cover all possible information. If you have questions about this medicine, talk to your doctor, pharmacist, orhealth care provider.  2022 Elsevier/Gold Standard (2017-10-03 18:06:11)    To help prevent nausea and vomiting after your treatment, we encourage you to take your nausea medication as directed.  BELOW ARE SYMPTOMS THAT SHOULD BE REPORTED IMMEDIATELY: *FEVER GREATER THAN 100.4 F (38 C) OR HIGHER *CHILLS OR SWEATING *NAUSEA AND VOMITING THAT IS NOT CONTROLLED WITH YOUR NAUSEA MEDICATION *UNUSUAL SHORTNESS OF BREATH *UNUSUAL BRUISING OR BLEEDING *URINARY PROBLEMS (pain or burning when urinating, or frequent urination) *BOWEL PROBLEMS (unusual diarrhea, constipation, pain near the anus) TENDERNESS IN MOUTH AND THROAT WITH OR WITHOUT PRESENCE OF ULCERS (sore throat, sores in mouth, or a toothache) UNUSUAL RASH, SWELLING OR PAIN  UNUSUAL VAGINAL DISCHARGE OR ITCHING   Items with * indicate a potential emergency and should be followed up as soon as possible or go to the Emergency Department if any problems should occur.  Please show the CHEMOTHERAPY ALERT CARD or IMMUNOTHERAPY  ALERT CARD at check-in to the Emergency Department and triage nurse.  Should you have questions after your visit or need to cancel or reschedule your appointment, please contact [facility]  [phone] and follow the prompts.  Office hours are 8:00 a.m. to 4:30 p.m. Monday - Friday. Please note that voicemails left after 4:00 p.m. may not be returned until the following business day.  We are closed weekends and major holidays. You have access to a nurse at all times for urgent questions. Please call the main number to the clinic [phone] and follow the prompts.  For any non-urgent questions, you may also contact your provider using MyChart. We now offer e-Visits for anyone 68 and older to request care online for  non-urgent symptoms. For details visit [email or web address].   Also download the MyChart app! Go to the app store, search "MyChart", open the app, select [facility], and log in with your MyChart username and password.  Due to Covid, a mask is required upon entering the hospital/clinic. If you do not have a mask, one will be given to you upon arrival. For doctor visits, patients may have 1 support person aged 67 or older with them. For treatment visits, patients cannot have anyone with them due to current Covid guidelines and our immunocompromised population.

## 2021-01-27 ENCOUNTER — Other Ambulatory Visit: Payer: Medicare Other

## 2021-01-27 ENCOUNTER — Ambulatory Visit: Payer: Medicare Other

## 2021-01-27 ENCOUNTER — Encounter: Payer: Self-pay | Admitting: Internal Medicine

## 2021-02-09 ENCOUNTER — Inpatient Hospital Stay (HOSPITAL_BASED_OUTPATIENT_CLINIC_OR_DEPARTMENT_OTHER): Payer: Medicare Other | Admitting: Internal Medicine

## 2021-02-09 ENCOUNTER — Inpatient Hospital Stay: Payer: Medicare Other

## 2021-02-09 ENCOUNTER — Encounter: Payer: Self-pay | Admitting: Internal Medicine

## 2021-02-09 VITALS — BP 148/83 | HR 100 | Temp 97.8°F | Resp 18 | Wt 207.2 lb

## 2021-02-09 DIAGNOSIS — C23 Malignant neoplasm of gallbladder: Secondary | ICD-10-CM

## 2021-02-09 DIAGNOSIS — Z5111 Encounter for antineoplastic chemotherapy: Secondary | ICD-10-CM | POA: Diagnosis not present

## 2021-02-09 DIAGNOSIS — R5383 Other fatigue: Secondary | ICD-10-CM

## 2021-02-09 LAB — CBC WITH DIFFERENTIAL/PLATELET
Abs Immature Granulocytes: 0.58 10*3/uL — ABNORMAL HIGH (ref 0.00–0.07)
Basophils Absolute: 0.1 10*3/uL (ref 0.0–0.1)
Basophils Relative: 1 %
Eosinophils Absolute: 0.1 10*3/uL (ref 0.0–0.5)
Eosinophils Relative: 1 %
HCT: 31.6 % — ABNORMAL LOW (ref 36.0–46.0)
Hemoglobin: 9.8 g/dL — ABNORMAL LOW (ref 12.0–15.0)
Immature Granulocytes: 4 %
Lymphocytes Relative: 24 %
Lymphs Abs: 3.7 10*3/uL (ref 0.7–4.0)
MCH: 26.5 pg (ref 26.0–34.0)
MCHC: 31 g/dL (ref 30.0–36.0)
MCV: 85.4 fL (ref 80.0–100.0)
Monocytes Absolute: 2.5 10*3/uL — ABNORMAL HIGH (ref 0.1–1.0)
Monocytes Relative: 16 %
Neutro Abs: 8.3 10*3/uL — ABNORMAL HIGH (ref 1.7–7.7)
Neutrophils Relative %: 54 %
Platelets: 369 10*3/uL (ref 150–400)
RBC: 3.7 MIL/uL — ABNORMAL LOW (ref 3.87–5.11)
RDW: 16.5 % — ABNORMAL HIGH (ref 11.5–15.5)
WBC: 15.3 10*3/uL — ABNORMAL HIGH (ref 4.0–10.5)
nRBC: 0 % (ref 0.0–0.2)

## 2021-02-09 LAB — COMPREHENSIVE METABOLIC PANEL
ALT: 14 U/L (ref 0–44)
AST: 28 U/L (ref 15–41)
Albumin: 3.8 g/dL (ref 3.5–5.0)
Alkaline Phosphatase: 90 U/L (ref 38–126)
Anion gap: 11 (ref 5–15)
BUN: 12 mg/dL (ref 8–23)
CO2: 24 mmol/L (ref 22–32)
Calcium: 8.9 mg/dL (ref 8.9–10.3)
Chloride: 104 mmol/L (ref 98–111)
Creatinine, Ser: 0.93 mg/dL (ref 0.44–1.00)
GFR, Estimated: >60 mL/min (ref >60)
Glucose, Bld: 187 mg/dL — ABNORMAL HIGH (ref 70–99)
Potassium: 3.9 mmol/L (ref 3.5–5.1)
Sodium: 139 mmol/L (ref 135–145)
Total Bilirubin: 0.1 mg/dL — ABNORMAL LOW (ref 0.3–1.2)
Total Protein: 7.2 g/dL (ref 6.5–8.1)

## 2021-02-09 LAB — MAGNESIUM: Magnesium: 1.9 mg/dL (ref 1.7–2.4)

## 2021-02-09 MED ORDER — HEPARIN SOD (PORK) LOCK FLUSH 100 UNIT/ML IV SOLN
500.0000 [IU] | Freq: Once | INTRAVENOUS | Status: AC | PRN
  Administered 2021-02-09: 500 [IU]
  Filled 2021-02-09: qty 5

## 2021-02-09 MED ORDER — SODIUM CHLORIDE 0.9 % IV SOLN
2000.0000 mg | Freq: Once | INTRAVENOUS | Status: AC
  Administered 2021-02-09: 2000 mg via INTRAVENOUS
  Filled 2021-02-09: qty 52.6

## 2021-02-09 MED ORDER — SODIUM CHLORIDE 0.9 % IV SOLN
150.0000 mg | Freq: Once | INTRAVENOUS | Status: AC
  Administered 2021-02-09: 150 mg via INTRAVENOUS
  Filled 2021-02-09: qty 150

## 2021-02-09 MED ORDER — SODIUM CHLORIDE 0.9 % IV SOLN
1500.0000 mg | Freq: Once | INTRAVENOUS | Status: AC
  Administered 2021-02-09: 1500 mg via INTRAVENOUS
  Filled 2021-02-09: qty 30

## 2021-02-09 MED ORDER — PALONOSETRON HCL INJECTION 0.25 MG/5ML
0.2500 mg | Freq: Once | INTRAVENOUS | Status: AC
  Administered 2021-02-09: 0.25 mg via INTRAVENOUS
  Filled 2021-02-09: qty 5

## 2021-02-09 MED ORDER — SODIUM CHLORIDE 0.9 % IV SOLN
25.0000 mg/m2 | Freq: Once | INTRAVENOUS | Status: AC
  Administered 2021-02-09: 53 mg via INTRAVENOUS
  Filled 2021-02-09: qty 53

## 2021-02-09 MED ORDER — POTASSIUM CHLORIDE IN NACL 20-0.9 MEQ/L-% IV SOLN
Freq: Once | INTRAVENOUS | Status: AC
  Filled 2021-02-09: qty 1000

## 2021-02-09 MED ORDER — POTASSIUM CHLORIDE CRYS ER 20 MEQ PO TBCR: EXTENDED_RELEASE_TABLET | ORAL | 3 refills | Status: AC

## 2021-02-09 MED ORDER — MAGNESIUM SULFATE 2 GM/50ML IV SOLN
2.0000 g | Freq: Once | INTRAVENOUS | Status: AC
  Administered 2021-02-09: 2 g via INTRAVENOUS
  Filled 2021-02-09: qty 50

## 2021-02-09 MED ORDER — SODIUM CHLORIDE 0.9 % IV SOLN
10.0000 mg | Freq: Once | INTRAVENOUS | Status: AC
  Administered 2021-02-09: 10 mg via INTRAVENOUS
  Filled 2021-02-09: qty 10

## 2021-02-09 MED ORDER — SODIUM CHLORIDE 0.9 % IV SOLN
Freq: Once | INTRAVENOUS | Status: AC
  Filled 2021-02-09: qty 250

## 2021-02-09 MED ORDER — GLIPIZIDE 5 MG PO TABS: 5.0000 mg | ORAL_TABLET | Freq: Two times a day (BID) | ORAL | 3 refills | Status: DC

## 2021-02-09 MED ORDER — HEPARIN SOD (PORK) LOCK FLUSH 100 UNIT/ML IV SOLN
INTRAVENOUS | Status: AC
  Filled 2021-02-09: qty 5

## 2021-02-09 NOTE — Progress Notes (Signed)
Here for chemo today. Doing well. No complaints. Had paracentesis 3 weeks ago and still feeling good.

## 2021-02-09 NOTE — Progress Notes (Signed)
[facility] CONSULT NOTE  Patient Care Team: Revelo, Elyse Jarvis, MD as PCP - General (Family Medicine) Rico Junker, RN as Registered Nurse Christene Lye, MD (General Surgery) Clent Jacks, RN as Oncology Nurse Navigator  CHIEF COMPLAINTS/PURPOSE OF CONSULTATION: GALLBLADDER CANCER   #  Oncology History Overview Note  10/25- GALLBLADDER CANCER- [Dr.Cannon]/Dr.Byerly- laproscopic cholecystectomy- pT Category: pT3; pN1' POSITIVE for PNI; Histologic Type: Adenocarcinoma, biliary type  Histologic Grade: G3, poorly differentiated  Tumor Size: Greatest dimension: 2.2 cm  Tumor Extent: Tumor directly invades the liver  Lymphovascular Invasion: Present pre-treatement   Tumor Site: Fundus MARGINS  Margin Status for Invasive Carcinoma:      Margin Involved by Invasive Carcinoma: Liver parenchymal / hepatic  Bed; Number of Lymph Nodes with Tumor: 1       Number of Lymph Nodes Examined: 1    # October 25th 2021- Incidental gallbladder ADENOCA-s/p acute cholecystectomy [Dr.Cannon]. Stage III- T3N1; liver margin positive.  CT chest negative for any metastatic disease.  MRI abdomen with and without contrast-residual disease noted gallbladder bed; no evidence of distant metastases or lymphadenopathy. NOv 3rd 2021-CA 19-9 > 2000;     # NEOADJUVANT CHEMO-12/15-  Gem-cis [d1 & d-8 q 21 days] ; DEC 2021- Ca-19-03-4321.   June 14th, 2022- Dr.Byerly- METASTATIC DISEASE [. Multiple firm nodules were identified adherent to the anterior abdominal wall in both the RUQ as well as periumbilical area. These were primarily in the omentum, though one was between the liver and the abdominal wall.  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  female stage IV adenocarcinoma gallbladder is currently on palliative chemotherapy-gemcitabine cisplatin-Durvalumab.  Patient interim underwent paracentesis/had malignant ascites drained.  She feels improved abdominal distention is better. Leg swelling better.   No significant nausea vomiting.  No worsening constipation.  Review of Systems  Constitutional:  Negative for chills, diaphoresis, fever and malaise/fatigue.  HENT:  Negative for nosebleeds and sore throat.   Eyes:  Negative for double vision.  Respiratory:  Negative for cough, hemoptysis, sputum production, shortness of breath and wheezing.   Cardiovascular:  Negative for chest pain, palpitations, orthopnea and leg swelling.  Gastrointestinal:  Positive for abdominal pain and constipation. Negative for blood in stool, diarrhea, heartburn, melena, nausea and vomiting.  Genitourinary:  Negative for dysuria, frequency and urgency.  Musculoskeletal:  Negative for back pain and joint pain.  Skin: Negative.  Negative for itching and rash.  Neurological:  Negative for dizziness, tingling, focal weakness, weakness and headaches.  Endo/Heme/Allergies:  Does not bruise/bleed easily.  Psychiatric/Behavioral:  Negative for depression. The patient is not nervous/anxious  and does not have insomnia.     MEDICAL HISTORY:  Past Medical History:  Diagnosis Date   Cancer  ([place]) 05/17/2020   gallbladder   Diabetes mellitus without complication ([place])    History of chemotherapy    started 06/2020   Hypertension     SURGICAL HISTORY: Past Surgical History:  Procedure Laterality Date   ABDOMINAL HYSTERECTOMY     APPENDECTOMY  2004   BREAST BIOPSY Left 2011   BREAST BIOPSY Right 08/26/13   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/28/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   CHOLECYSTECTOMY N/A 05/17/2020   Procedure: LAPAROSCOPIC CHOLECYSTECTOMY;  Surgeon: Fredirick Maudlin, MD;  Location: [facility] ORS;  Service: General;  Laterality: N/A;   COLONOSCOPY WITH PROPOFOL N/A 01/31/2018   Procedure: COLONOSCOPY WITH PROPOFOL;  Surgeon: Jonathon Bellows, MD;  Location: [facility] ENDOSCOPY;  Service: Gastroenterology;  Laterality: N/A;   LAPAROSCOPIC LYSIS OF ADHESIONS  01/04/2021   Procedure: LAPAROSCOPIC LYSIS OF ADHESIONS;  Surgeon: Stark Klein, MD;  Location: [facility];  Service: General;;  1 HOUR   LAPAROSCOPY N/A 01/04/2021   Procedure: LAPAROSCOPY DIAGNOSTIC;  Surgeon: Stark Klein, MD;  Location: [facility];  Service: General;  Laterality: N/A;  EPIDURAL   PORTACATH PLACEMENT Right 07/02/2020   Procedure: INSERTION PORT-A-CATH, chemotherapy;  Surgeon: Fredirick Maudlin, MD;  Location: [facility] ORS;  Service: General;  Laterality: Right;    SOCIAL HISTORY: Social History   Socioeconomic History   Marital status: Single    Spouse name: Not on file   Number of children: Not on file   Years of education: Not on file   Highest education level: Not on file  Occupational History   Occupation: special needs worker  Tobacco Use   Smoking status: Never   Smokeless tobacco: Never  Vaping Use   Vaping Use: Never used  Substance and Sexual Activity   Alcohol use: Yes    Alcohol/week: 2.0 standard drinks    Types: 2 Cans of beer per week    Comment: beer on weekend   Drug use: No   Sexual activity: Not Currently  Other Topics Concern   Not on file  Social History Narrative   Lives in [place]; with son. 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 Never smoked; beer/iqour over weekends.    Social Determinants of Health   Financial Resource Strain: Not on file  Food Insecurity: Not on file  Transportation Needs: Not on file  Physical Activity: Not on file  Stress: Not on file  Social Connections: Not on file  Intimate Partner Violence: Not on file    FAMILY HISTORY: Family History  Problem Relation Age of Onset   Pancreatic cancer Mother        AT 58 years   Breast cancer Sister        appx-55 years   Pancreatic cancer Maternal Grandmother     ALLERGIES:  is allergic to seasonal ic [cholestatin].  MEDICATIONS:  Current Outpatient Medications  Medication Sig Dispense Refill   Chlorpheniramine Maleate (ALLERGY RELIEF PO) Take 1 tablet by mouth daily as needed (allergies).     CINNAMON PO Take 1 tablet by mouth daily.     GARLIC PO Take 1 tablet by mouth daily.     lidocaine-prilocaine (EMLA) cream Apply 1 application topically as needed. Apply on the port 30-45 mins prior to port access 30 g 1   losartan (COZAAR) 25 MG tablet Take 25 mg by mouth every morning.      Multiple Vitamins-Minerals (MULTIVITAMIN WITH MINERALS) tablet  Take 1 tablet by mouth daily.     ondansetron (ZOFRAN) 8 MG tablet One pill every 8 hours as needed for nausea/vomitting. 40 tablet 1   oxyCODONE (OXY IR/ROXICODONE) 5 MG immediate release tablet Take 1-2 tablets (5-10 mg total) by mouth every 6 (six) hours as needed for severe pain. 60 tablet 0   pravastatin (PRAVACHOL) 10 MG tablet Take 10 mg by mouth 4 (four) times a week.     prochlorperazine (COMPAZINE) 10 MG tablet Take 1 tablet (10 mg total) by mouth every 6 (six) hours as needed for nausea or vomiting. 40 tablet 1   glipiZIDE (GLUCOTROL) 5 MG tablet Take 1 tablet (5 mg total) by mouth 2 (two) times daily before a meal. 60 tablet 3   potassium chloride SA (KLOR-CON) 20 MEQ tablet 1 pill twice a day 60 tablet 3   No current  facility-administered medications for this visit.      Marland Kitchen  PHYSICAL EXAMINATION: ECOG PERFORMANCE STATUS: 0 - Asymptomatic  Vitals:   02/09/21 0839  BP: (!) 148/83  Pulse: 100  Resp: 18  Temp: 97.8 F (36.6 C)  SpO2: 100%   Filed Weights   02/09/21 0839  Weight: 207 lb 3.2 oz (94 kg)    Physical Exam HENT:     Head: Normocephalic and atraumatic.     Mouth/Throat:     Pharynx: No oropharyngeal exudate.  Eyes:     Pupils: Pupils are equal, round, and reactive to light.  Cardiovascular:     Rate and Rhythm: Normal rate and regular rhythm.  Pulmonary:     Effort: Pulmonary effort is normal. No respiratory distress.     Breath sounds: Normal breath sounds. No wheezing.  Abdominal:     General: Bowel sounds are normal. There is distension.     Palpations: Abdomen is soft. There is no mass.     Tenderness: no abdominal tenderness There is no guarding or rebound.  Musculoskeletal:        General: No tenderness. Normal range of motion.     Cervical back: Normal range of motion and neck supple.  Skin:    General: Skin is warm.  Neurological:     Mental Status: She is alert and oriented to person, place, and time.  Psychiatric:        Mood and Affect: Affect normal.     LABORATORY DATA:  I have reviewed the data as listed Lab Results  Component Value Date   WBC 15.3 (H) 02/09/2021   HGB 9.8 (L) 02/09/2021   HCT 31.6 (L) 02/09/2021   MCV 85.4 02/09/2021   PLT 369 02/09/2021   Recent Labs    01/19/21 0822 01/26/21 0827 02/09/21 0824  NA 136 139 139  K 3.4* 3.9 3.9  CL 102 102 104  CO2 '26 28 24  ' GLUCOSE 138* 143* 187*  BUN '20 12 12  ' CREATININE 0.85 0.79 0.93  CALCIUM 8.4* 8.4* 8.9  GFRNONAA >60 >60 >60  PROT 6.4* 6.6 7.2  ALBUMIN 3.2* 3.1* 3.8  AST '17 30 28  ' ALT '12 25 14  ' ALKPHOS 61 102 90  BILITOT 0.6 0.5 0.1*    RADIOGRAPHIC STUDIES: I have personally reviewed the radiological images as listed and agreed with the findings in the report. US  Paracentesis  Result Date: 01/20/2021 INDICATION: History of gallbladder carcinoma and increased abdominal distension concerning for ascites clinically. EXAM: ULTRASOUND GUIDED PARACENTESIS MEDICATIONS: None. COMPLICATIONS: None immediate. PROCEDURE: Informed written consent was obtained from the patient after  a discussion of the risks, benefits and alternatives to treatment. A timeout was performed prior to the initiation of the procedure. Initial ultrasound was performed to localize ascites. The left lateral abdominal wall was prepped and draped in the usual sterile fashion. 1% lidocaine was used for local anesthesia. Following this, a 6 Fr Safe-T-Centesis catheter was introduced. An ultrasound image was saved for documentation purposes. The paracentesis was performed. The catheter was removed and a dressing was applied. The patient tolerated the procedure well without immediate post procedural complication. FINDINGS: A total of approximately 2.7 L of yellowish fluid was removed. Fluid was sent for requested laboratory tests. IMPRESSION: Successful ultrasound-guided paracentesis yielding 2.7 liters of peritoneal fluid. Electronically Signed   By: Aletta Edouard M.D.   On: 01/20/2021 10:30     ASSESSMENT & PLAN:   Primary adenocarcinoma of gall bladder (HCC) #Metastatic gallbladder adenocarcinoma [ s/p neoadjuvant chemotherapy unfortunately noted to have metastatic disease on laparoscopy] .CA 19-9- June 2022- 1700. palliative chemotherapy-gemcitabine and cisplatin  + Durvalumab.  # Proceed with cycle #2- d-1 today. Labs today reviewed;  acceptable for treatment today.   # Abdominal distention/abdominal pain- likely malignant ascites; s/p US/paracentsis- STABLE.   # Blood glucose-s/p P-BF-BG-187 [HbA1c- May 5.5; increase glipizide 5 mg t 5 mg BID. Refilled..   # HTN-  continue with cozaar-STABLE  # Hypokalemia-- sec to cisplatin- improved; continue Kdur 20 meQ once a day; refilled .  #  DISPOSITION: # referral to [facility] [next week-appt] re: protein intake # as planned in 1 week labs- cbc/bmp; Gem-Cis/onpro-  # follow up in 3 weeks/wed- MD: labs- cbc/cmp/mag; ca19-9;TSH;  Gem-Cis-Durvalumab; #  planned in 4 week labs- cbc/bmp; Gem-Cis/onpro- -Dr.B   All questions were answered. The patient knows to call the clinic with any problems, questions or concerns.   Cammie Sickle, MD 02/09/2021 9:31 PM

## 2021-02-09 NOTE — Assessment & Plan Note (Addendum)
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 palliative chemotherapy-gemcitabine and cisplatin  + Durvalumab.  # Proceed with cycle #2- d-1 today. Labs today reviewed;  acceptable for treatment today.   # Abdominal distention/abdominal pain- likely malignant ascites; s/p US/paracentsis- STABLE.   # Blood glucose-s/p P-BF-BG-187 [HbA1c- May 5.5; increase glipizide 5 mg t 5 mg BID. Refilled..   # HTN-  continue with cozaar-STABLE  # Hypokalemia-- sec to cisplatin- improved; continue Kdur 20 meQ once a day; refilled .  # DISPOSITION: # referral to [facility] [next week-appt] re: protein intake # as planned in 1 week labs- cbc/bmp; Gem-Cis/onpro-  # follow up in 3 weeks/wed- MD: labs- cbc/cmp/mag; ca19-9;TSH;  Gem-Cis-Durvalumab; #  planned in 4 week labs- cbc/bmp; Gem-Cis/onpro- -Dr.B

## 2021-02-09 NOTE — Patient Instructions (Signed)
[facility] ONCOLOGY  Discharge Instructions: Thank you for choosing [facility] to provide your oncology and hematology care.  If you have a lab appointment with the [facility], please go directly to the [facility] and check in at the registration area.  Wear comfortable clothing and clothing appropriate for easy access to any Portacath or PICC line.   We strive to give you quality time with your provider. You may need to reschedule your appointment if you arrive late (15 or more minutes).  Arriving late affects you and other patients whose appointments are after yours.  Also, if you miss three or more appointments without notifying the office, you may be dismissed from the clinic at the provider's discretion.      For prescription refill requests, have your pharmacy contact our office and allow 72 hours for refills to be completed.    Today you received the following chemotherapy and/or immunotherapy agents  Imfinzi, Gemzar, Cisplatin    To help prevent nausea and vomiting after your treatment, we encourage you to take your nausea medication as directed.  BELOW ARE SYMPTOMS THAT SHOULD BE REPORTED IMMEDIATELY: *FEVER GREATER THAN 100.4 F (38 C) OR HIGHER *CHILLS OR SWEATING *NAUSEA AND VOMITING THAT IS NOT CONTROLLED WITH YOUR NAUSEA MEDICATION *UNUSUAL SHORTNESS OF BREATH *UNUSUAL BRUISING OR BLEEDING *URINARY PROBLEMS (pain or burning when urinating, or frequent urination) *BOWEL PROBLEMS (unusual diarrhea, constipation, pain near the anus) TENDERNESS IN MOUTH AND THROAT WITH OR WITHOUT PRESENCE OF ULCERS (sore throat, sores in mouth, or a toothache) UNUSUAL RASH, SWELLING OR PAIN  UNUSUAL VAGINAL DISCHARGE OR ITCHING   Items with * indicate a potential emergency and should be followed up as soon as possible or go to the Emergency Department if any problems should occur.  Please show the CHEMOTHERAPY ALERT CARD or IMMUNOTHERAPY ALERT  CARD at check-in to the Emergency Department and triage nurse.  Should you have questions after your visit or need to cancel or reschedule your appointment, please contact [facility]  [phone] and follow the prompts.  Office hours are 8:00 a.m. to 4:30 p.m. Monday - Friday. Please note that voicemails left after 4:00 p.m. may not be returned until the following business day.  We are closed weekends and major holidays. You have access to a nurse at all times for urgent questions. Please call the main number to the clinic [phone] and follow the prompts.  For any non-urgent questions, you may also contact your provider using MyChart. We now offer e-Visits for anyone 57 and older to request care online for non-urgent symptoms. For details visit [email or web address].   Also download the MyChart app! Go to the app store, search "MyChart", open the app, select [facility], and log in with your MyChart username and password.  Due to Covid, a mask is required upon entering the hospital/clinic. If you do not have a mask, one will be given to you upon arrival. For doctor visits, patients may have 1 support person aged 98 or older with them. For treatment visits, patients cannot have anyone with them due to current Covid guidelines and our immunocompromised population.

## 2021-02-16 ENCOUNTER — Inpatient Hospital Stay: Payer: Medicare Other

## 2021-02-16 ENCOUNTER — Other Ambulatory Visit: Payer: Self-pay

## 2021-02-16 ENCOUNTER — Other Ambulatory Visit: Payer: Self-pay | Admitting: Internal Medicine

## 2021-02-16 VITALS — BP 109/61 | HR 85 | Temp 96.3°F | Resp 20 | Wt 205.2 lb

## 2021-02-16 DIAGNOSIS — Z95828 Presence of other vascular implants and grafts: Secondary | ICD-10-CM

## 2021-02-16 DIAGNOSIS — Z5111 Encounter for antineoplastic chemotherapy: Secondary | ICD-10-CM | POA: Diagnosis not present

## 2021-02-16 DIAGNOSIS — C23 Malignant neoplasm of gallbladder: Secondary | ICD-10-CM

## 2021-02-16 LAB — CBC WITH DIFFERENTIAL/PLATELET
Abs Immature Granulocytes: 0.02 10*3/uL (ref 0.00–0.07)
Basophils Absolute: 0.1 10*3/uL (ref 0.0–0.1)
Basophils Relative: 1 %
Eosinophils Absolute: 0 10*3/uL (ref 0.0–0.5)
Eosinophils Relative: 1 %
HCT: 31 % — ABNORMAL LOW (ref 36.0–46.0)
Hemoglobin: 9.6 g/dL — ABNORMAL LOW (ref 12.0–15.0)
Immature Granulocytes: 1 %
Lymphocytes Relative: 54 %
Lymphs Abs: 2.1 10*3/uL (ref 0.7–4.0)
MCH: 26.2 pg (ref 26.0–34.0)
MCHC: 31 g/dL (ref 30.0–36.0)
MCV: 84.7 fL (ref 80.0–100.0)
Monocytes Absolute: 0.5 10*3/uL (ref 0.1–1.0)
Monocytes Relative: 13 %
Neutro Abs: 1.2 10*3/uL — ABNORMAL LOW (ref 1.7–7.7)
Neutrophils Relative %: 30 %
Platelets: 362 10*3/uL (ref 150–400)
RBC: 3.66 MIL/uL — ABNORMAL LOW (ref 3.87–5.11)
RDW: 16.2 % — ABNORMAL HIGH (ref 11.5–15.5)
WBC: 4 10*3/uL (ref 4.0–10.5)
nRBC: 0 % (ref 0.0–0.2)

## 2021-02-16 LAB — BASIC METABOLIC PANEL
Anion gap: 8 (ref 5–15)
BUN: 20 mg/dL (ref 8–23)
CO2: 23 mmol/L (ref 22–32)
Calcium: 8.8 mg/dL — ABNORMAL LOW (ref 8.9–10.3)
Chloride: 105 mmol/L (ref 98–111)
Creatinine, Ser: 0.84 mg/dL (ref 0.44–1.00)
GFR, Estimated: >60 mL/min (ref >60)
Glucose, Bld: 205 mg/dL — ABNORMAL HIGH (ref 70–99)
Potassium: 3.9 mmol/L (ref 3.5–5.1)
Sodium: 136 mmol/L (ref 135–145)

## 2021-02-16 LAB — MAGNESIUM: Magnesium: 1.9 mg/dL (ref 1.7–2.4)

## 2021-02-16 MED ORDER — SODIUM CHLORIDE 0.9 % IV SOLN
2000.0000 mg | Freq: Once | INTRAVENOUS | Status: AC
  Administered 2021-02-16: 2000 mg via INTRAVENOUS
  Filled 2021-02-16: qty 52.6

## 2021-02-16 MED ORDER — HEPARIN SOD (PORK) LOCK FLUSH 100 UNIT/ML IV SOLN
500.0000 [IU] | Freq: Once | INTRAVENOUS | Status: AC
  Administered 2021-02-16: 500 [IU] via INTRAVENOUS
  Filled 2021-02-16: qty 5

## 2021-02-16 MED ORDER — SODIUM CHLORIDE 0.9% FLUSH
10.0000 mL | INTRAVENOUS | Status: DC | PRN
  Administered 2021-02-16: 10 mL via INTRAVENOUS
  Filled 2021-02-16: qty 10

## 2021-02-16 MED ORDER — POTASSIUM CHLORIDE IN NACL 20-0.9 MEQ/L-% IV SOLN
Freq: Once | INTRAVENOUS | Status: AC
  Filled 2021-02-16: qty 1000

## 2021-02-16 MED ORDER — CISPLATIN CHEMO INJECTION 100MG/100ML
25.0000 mg/m2 | Freq: Once | INTRAVENOUS | Status: AC
  Administered 2021-02-16: 53 mg via INTRAVENOUS
  Filled 2021-02-16: qty 53

## 2021-02-16 MED ORDER — SODIUM CHLORIDE 0.9 % IV SOLN
Freq: Once | INTRAVENOUS | Status: AC
  Filled 2021-02-16: qty 250

## 2021-02-16 MED ORDER — SODIUM CHLORIDE 0.9 % IV SOLN
10.0000 mg | Freq: Once | INTRAVENOUS | Status: AC
  Administered 2021-02-16: 10 mg via INTRAVENOUS
  Filled 2021-02-16: qty 10

## 2021-02-16 MED ORDER — HEPARIN SOD (PORK) LOCK FLUSH 100 UNIT/ML IV SOLN
INTRAVENOUS | Status: AC
  Filled 2021-02-16: qty 5

## 2021-02-16 MED ORDER — PEGFILGRASTIM 6 MG/0.6ML ~~LOC~~ PSKT
6.0000 mg | PREFILLED_SYRINGE | Freq: Once | SUBCUTANEOUS | Status: AC
  Administered 2021-02-16: 6 mg via SUBCUTANEOUS
  Filled 2021-02-16: qty 0.6

## 2021-02-16 MED ORDER — SODIUM CHLORIDE 0.9 % IV SOLN
150.0000 mg | Freq: Once | INTRAVENOUS | Status: AC
  Administered 2021-02-16: 150 mg via INTRAVENOUS
  Filled 2021-02-16: qty 150

## 2021-02-16 MED ORDER — MAGNESIUM SULFATE 2 GM/50ML IV SOLN
2.0000 g | Freq: Once | INTRAVENOUS | Status: AC
  Administered 2021-02-16: 2 g via INTRAVENOUS
  Filled 2021-02-16: qty 50

## 2021-02-16 MED ORDER — PALONOSETRON HCL INJECTION 0.25 MG/5ML
0.2500 mg | Freq: Once | INTRAVENOUS | Status: AC
  Administered 2021-02-16: 0.25 mg via INTRAVENOUS
  Filled 2021-02-16: qty 5

## 2021-02-16 NOTE — Progress Notes (Signed)
-  ANC: 1200. MD, Dr. Rogue Bussing, notified and aware. Per MD order: proceed with scheduled Cisplatin and Gemzar treatment today; patient is scheduled to have Neulasta onpro kit applied post treatment today.  -Basic Metabolic Panel done today. Per MD, Dr. Rogue Bussing, order: reference Basic Metabolic Panel results and proceed with scheduled Cisplatin and Gemzar treatment today; Comprehensive Metabolic Panel does not need to be done today.

## 2021-02-16 NOTE — Progress Notes (Signed)
Nutrition Assessment:    65 year old female with metastatic gall bladder cancer.  Past medical history of DM, HTN.  Patient receiving gemcitabine, cisplatin, durvalumab.    Met with patient during infusion.  Patient reports good appetite. Denies nausea.  Reports constipation with treatment and takes laxative.  Patient reports usually eats eggs, sausage/bacon for breakfast, sometimes cereal.  Lunch is usually meat sandwich.  Dinner is meat and vegetables.      Medications: glipizide, MVI, zofran, KCL, compazine  Labs: not up for today Albumin 3.8 on 7/20  Anthropometrics:   Height: 66 inches Weight: 205 today 213-217 lb  Recent removal of fluid BMI: 33  5% weight loss in the last 3 months   Estimated Energy Needs  Kcals: 2000-2300 Protein: 112-141 g Fluid: 2 L  NUTRITION DIAGNOSIS: Unintentional weight loss related to cancer, fluid loss as evidenced by 5% weight loss in the last month   INTERVENTION:  Discussed well balanced diet including good sources of protein. Encouraged protein food at each meal.  Written foods containing protein provided Contact information provided    MONITORING, EVALUATION, GOAL: weight loss, intake   NEXT VISIT: Wednesday, August 17 during infusion  Jaimere Feutz B. Zenia Resides, [employer], [place] Registered Dietitian [phone] (mobile)

## 2021-02-16 NOTE — Patient Instructions (Signed)
[facility] ONCOLOGY   Discharge Instructions: Thank you for choosing [facility] to provide your oncology and hematology care.  If you have a lab appointment with the [facility], please go directly to the [facility] and check in at the registration area.  Wear comfortable clothing and clothing appropriate for easy access to any Portacath or PICC line.   We strive to give you quality time with your provider. You may need to reschedule your appointment if you arrive late (15 or more minutes).  Arriving late affects you and other patients whose appointments are after yours.  Also, if you miss three or more appointments without notifying the office, you may be dismissed from the clinic at the provider's discretion.      For prescription refill requests, have your pharmacy contact our office and allow 72 hours for refills to be completed.    Today you received the following chemotherapy and/or immunotherapy agents: Cisplatin, Gemzar.      To help prevent nausea and vomiting after your treatment, we encourage you to take your nausea medication as directed.  BELOW ARE SYMPTOMS THAT SHOULD BE REPORTED IMMEDIATELY: *FEVER GREATER THAN 100.4 F (38 C) OR HIGHER *CHILLS OR SWEATING *NAUSEA AND VOMITING THAT IS NOT CONTROLLED WITH YOUR NAUSEA MEDICATION *UNUSUAL SHORTNESS OF BREATH *UNUSUAL BRUISING OR BLEEDING *URINARY PROBLEMS (pain or burning when urinating, or frequent urination) *BOWEL PROBLEMS (unusual diarrhea, constipation, pain near the anus) TENDERNESS IN MOUTH AND THROAT WITH OR WITHOUT PRESENCE OF ULCERS (sore throat, sores in mouth, or a toothache) UNUSUAL RASH, SWELLING OR PAIN  UNUSUAL VAGINAL DISCHARGE OR ITCHING   Items with * indicate a potential emergency and should be followed up as soon as possible or go to the Emergency Department if any problems should occur.  Please show the CHEMOTHERAPY ALERT CARD or IMMUNOTHERAPY ALERT CARD  at check-in to the Emergency Department and triage nurse.  Should you have questions after your visit or need to cancel or reschedule your appointment, please contact [facility]  [phone] and follow the prompts.  Office hours are 8:00 a.m. to 4:30 p.m. Monday - Friday. Please note that voicemails left after 4:00 p.m. may not be returned until the following business day.  We are closed weekends and major holidays. You have access to a nurse at all times for urgent questions. Please call the main number to the clinic [phone] and follow the prompts.  For any non-urgent questions, you may also contact your provider using MyChart. We now offer e-Visits for anyone 70 and older to request care online for non-urgent symptoms. For details visit [email or web address].   Also download the MyChart app! Go to the app store, search "MyChart", open the app, select [facility], and log in with your MyChart username and password.  Due to Covid, a mask is required upon entering the hospital/clinic. If you do not have a mask, one will be given to you upon arrival. For doctor visits, patients may have 1 support person aged 61 or older with them. For treatment visits, patients cannot have anyone with them due to current Covid guidelines and our immunocompromised population.

## 2021-03-02 ENCOUNTER — Inpatient Hospital Stay: Payer: Medicare Other | Attending: Oncology

## 2021-03-02 ENCOUNTER — Other Ambulatory Visit: Payer: Self-pay

## 2021-03-02 ENCOUNTER — Inpatient Hospital Stay: Payer: Medicare Other

## 2021-03-02 ENCOUNTER — Encounter: Payer: Self-pay | Admitting: Internal Medicine

## 2021-03-02 ENCOUNTER — Other Ambulatory Visit: Payer: Self-pay | Admitting: *Deleted

## 2021-03-02 ENCOUNTER — Inpatient Hospital Stay (HOSPITAL_BASED_OUTPATIENT_CLINIC_OR_DEPARTMENT_OTHER): Payer: Medicare Other | Admitting: Internal Medicine

## 2021-03-02 DIAGNOSIS — Z5112 Encounter for antineoplastic immunotherapy: Secondary | ICD-10-CM | POA: Diagnosis not present

## 2021-03-02 DIAGNOSIS — Z5189 Encounter for other specified aftercare: Secondary | ICD-10-CM | POA: Insufficient documentation

## 2021-03-02 DIAGNOSIS — Z79899 Other long term (current) drug therapy: Secondary | ICD-10-CM | POA: Diagnosis not present

## 2021-03-02 DIAGNOSIS — C7989 Secondary malignant neoplasm of other specified sites: Secondary | ICD-10-CM | POA: Diagnosis not present

## 2021-03-02 DIAGNOSIS — C23 Malignant neoplasm of gallbladder: Secondary | ICD-10-CM

## 2021-03-02 DIAGNOSIS — Z5111 Encounter for antineoplastic chemotherapy: Secondary | ICD-10-CM | POA: Insufficient documentation

## 2021-03-02 DIAGNOSIS — R5383 Other fatigue: Secondary | ICD-10-CM

## 2021-03-02 DIAGNOSIS — E876 Hypokalemia: Secondary | ICD-10-CM | POA: Insufficient documentation

## 2021-03-02 DIAGNOSIS — Z95828 Presence of other vascular implants and grafts: Secondary | ICD-10-CM

## 2021-03-02 LAB — COMPREHENSIVE METABOLIC PANEL
ALT: 12 U/L (ref 0–44)
AST: 22 U/L (ref 15–41)
Albumin: 3.9 g/dL (ref 3.5–5.0)
Alkaline Phosphatase: 87 U/L (ref 38–126)
Anion gap: 7 (ref 5–15)
BUN: 17 mg/dL (ref 8–23)
CO2: 26 mmol/L (ref 22–32)
Calcium: 8.8 mg/dL — ABNORMAL LOW (ref 8.9–10.3)
Chloride: 104 mmol/L (ref 98–111)
Creatinine, Ser: 0.81 mg/dL (ref 0.44–1.00)
GFR, Estimated: >60 mL/min (ref >60)
Glucose, Bld: 173 mg/dL — ABNORMAL HIGH (ref 70–99)
Potassium: 3.8 mmol/L (ref 3.5–5.1)
Sodium: 137 mmol/L (ref 135–145)
Total Bilirubin: 0.3 mg/dL (ref 0.3–1.2)
Total Protein: 7 g/dL (ref 6.5–8.1)

## 2021-03-02 LAB — CBC WITH DIFFERENTIAL/PLATELET
Abs Immature Granulocytes: 0.7 10*3/uL — ABNORMAL HIGH (ref 0.00–0.07)
Basophils Absolute: 0.1 10*3/uL (ref 0.0–0.1)
Basophils Relative: 1 %
Eosinophils Absolute: 0.2 10*3/uL (ref 0.0–0.5)
Eosinophils Relative: 1 %
HCT: 29 % — ABNORMAL LOW (ref 36.0–46.0)
Hemoglobin: 9.1 g/dL — ABNORMAL LOW (ref 12.0–15.0)
Immature Granulocytes: 5 %
Lymphocytes Relative: 22 %
Lymphs Abs: 3.4 10*3/uL (ref 0.7–4.0)
MCH: 26.6 pg (ref 26.0–34.0)
MCHC: 31.4 g/dL (ref 30.0–36.0)
MCV: 84.8 fL (ref 80.0–100.0)
Monocytes Absolute: 1.8 10*3/uL — ABNORMAL HIGH (ref 0.1–1.0)
Monocytes Relative: 12 %
Neutro Abs: 9 10*3/uL — ABNORMAL HIGH (ref 1.7–7.7)
Neutrophils Relative %: 59 %
Platelets: 179 10*3/uL (ref 150–400)
RBC: 3.42 MIL/uL — ABNORMAL LOW (ref 3.87–5.11)
RDW: 18.6 % — ABNORMAL HIGH (ref 11.5–15.5)
WBC: 15.1 10*3/uL — ABNORMAL HIGH (ref 4.0–10.5)
nRBC: 0 % (ref 0.0–0.2)

## 2021-03-02 LAB — TSH: TSH: 4.638 u[IU]/mL — ABNORMAL HIGH (ref 0.350–4.500)

## 2021-03-02 LAB — MAGNESIUM: Magnesium: 1.9 mg/dL (ref 1.7–2.4)

## 2021-03-02 MED ORDER — SODIUM CHLORIDE 0.9% FLUSH
10.0000 mL | Freq: Once | INTRAVENOUS | Status: AC
  Administered 2021-03-02: 10 mL via INTRAVENOUS
  Filled 2021-03-02: qty 10

## 2021-03-02 MED ORDER — SODIUM CHLORIDE 0.9 % IV SOLN
25.0000 mg/m2 | Freq: Once | INTRAVENOUS | Status: AC
  Administered 2021-03-02: 53 mg via INTRAVENOUS
  Filled 2021-03-02: qty 53

## 2021-03-02 MED ORDER — POTASSIUM CHLORIDE IN NACL 20-0.9 MEQ/L-% IV SOLN
Freq: Once | INTRAVENOUS | Status: AC
  Filled 2021-03-02: qty 1000

## 2021-03-02 MED ORDER — SODIUM CHLORIDE 0.9 % IV SOLN
10.0000 mg | Freq: Once | INTRAVENOUS | Status: AC
  Administered 2021-03-02: 10 mg via INTRAVENOUS
  Filled 2021-03-02: qty 10

## 2021-03-02 MED ORDER — SODIUM CHLORIDE 0.9 % IV SOLN
2000.0000 mg | Freq: Once | INTRAVENOUS | Status: AC
  Administered 2021-03-02: 2000 mg via INTRAVENOUS
  Filled 2021-03-02: qty 52.6

## 2021-03-02 MED ORDER — HEPARIN SOD (PORK) LOCK FLUSH 100 UNIT/ML IV SOLN
500.0000 [IU] | Freq: Once | INTRAVENOUS | Status: DC
  Filled 2021-03-02: qty 5

## 2021-03-02 MED ORDER — MAGNESIUM SULFATE 2 GM/50ML IV SOLN
2.0000 g | Freq: Once | INTRAVENOUS | Status: AC
  Administered 2021-03-02: 2 g via INTRAVENOUS
  Filled 2021-03-02: qty 50

## 2021-03-02 MED ORDER — SODIUM CHLORIDE 0.9 % IV SOLN
150.0000 mg | Freq: Once | INTRAVENOUS | Status: AC
  Administered 2021-03-02: 150 mg via INTRAVENOUS
  Filled 2021-03-02: qty 150

## 2021-03-02 MED ORDER — PALONOSETRON HCL INJECTION 0.25 MG/5ML
0.2500 mg | Freq: Once | INTRAVENOUS | Status: AC
  Administered 2021-03-02: 0.25 mg via INTRAVENOUS
  Filled 2021-03-02: qty 5

## 2021-03-02 MED ORDER — SODIUM CHLORIDE 0.9 % IV SOLN
1500.0000 mg | Freq: Once | INTRAVENOUS | Status: AC
  Administered 2021-03-02: 1500 mg via INTRAVENOUS
  Filled 2021-03-02: qty 30

## 2021-03-02 MED ORDER — SODIUM CHLORIDE 0.9 % IV SOLN
Freq: Once | INTRAVENOUS | Status: AC
  Filled 2021-03-02: qty 250

## 2021-03-02 MED ORDER — HEPARIN SOD (PORK) LOCK FLUSH 100 UNIT/ML IV SOLN
500.0000 [IU] | Freq: Once | INTRAVENOUS | Status: AC | PRN
  Administered 2021-03-02: 500 [IU]
  Filled 2021-03-02: qty 5

## 2021-03-02 NOTE — Assessment & Plan Note (Signed)
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 palliative chemotherapy-gemcitabine and cisplatin  + Durvalumab.  # Proceed with cycle #3- d-1 today. Labs today reviewed;  acceptable for treatment today.   # Abdominal distention/abdominal pain- likely malignant ascites; s/p US/paracentsis- STABLE.   # Blood glucose-s/p P-BF-BG-187 [HbA1c- May 5.5; increase glipizide 5 mg BID.STABLE.    # HTN-  continue with cozaar-STABLE  # Hypokalemia-- sec to cisplatin- improved; continue Kdur 20 meQ once a day; refilled .  # DISPOSITION: # as planned in 1 week labs- cbc/bmp; Gem-Cis/onpro-  # follow up in 3 weeks/wed- MD: labs- cbc/cmp/mag; ca19-9;TSH;  Gem-Cis-Durvalumab; #  planned in 4 week labs- cbc/bmp; Gem-Cis/onpro- -Dr.B

## 2021-03-02 NOTE — Patient Instructions (Signed)
[facility] ONCOLOGY  Discharge Instructions: Thank you for choosing [facility] to provide your oncology and hematology care.  If you have a lab appointment with the [facility], please go directly to the [facility] and check in at the registration area.  Wear comfortable clothing and clothing appropriate for easy access to any Portacath or PICC line.   We strive to give you quality time with your provider. You may need to reschedule your appointment if you arrive late (15 or more minutes).  Arriving late affects you and other patients whose appointments are after yours.  Also, if you miss three or more appointments without notifying the office, you may be dismissed from the clinic at the provider's discretion.      For prescription refill requests, have your pharmacy contact our office and allow 72 hours for refills to be completed.    Today you received the following chemotherapy and/or immunotherapy agents : Imfinzi / Gemzar / Cisplatin    To help prevent nausea and vomiting after your treatment, we encourage you to take your nausea medication as directed.  BELOW ARE SYMPTOMS THAT SHOULD BE REPORTED IMMEDIATELY: *FEVER GREATER THAN 100.4 F (38 C) OR HIGHER *CHILLS OR SWEATING *NAUSEA AND VOMITING THAT IS NOT CONTROLLED WITH YOUR NAUSEA MEDICATION *UNUSUAL SHORTNESS OF BREATH *UNUSUAL BRUISING OR BLEEDING *URINARY PROBLEMS (pain or burning when urinating, or frequent urination) *BOWEL PROBLEMS (unusual diarrhea, constipation, pain near the anus) TENDERNESS IN MOUTH AND THROAT WITH OR WITHOUT PRESENCE OF ULCERS (sore throat, sores in mouth, or a toothache) UNUSUAL RASH, SWELLING OR PAIN  UNUSUAL VAGINAL DISCHARGE OR ITCHING   Items with * indicate a potential emergency and should be followed up as soon as possible or go to the Emergency Department if any problems should occur.  Please show the CHEMOTHERAPY ALERT CARD or IMMUNOTHERAPY ALERT  CARD at check-in to the Emergency Department and triage nurse.  Should you have questions after your visit or need to cancel or reschedule your appointment, please contact [facility]  [phone] and follow the prompts.  Office hours are 8:00 a.m. to 4:30 p.m. Monday - Friday. Please note that voicemails left after 4:00 p.m. may not be returned until the following business day.  We are closed weekends and major holidays. You have access to a nurse at all times for urgent questions. Please call the main number to the clinic [phone] and follow the prompts.  For any non-urgent questions, you may also contact your provider using MyChart. We now offer e-Visits for anyone 25 and older to request care online for non-urgent symptoms. For details visit [email or web address].   Also download the MyChart app! Go to the app store, search "MyChart", open the app, select [facility], and log in with your MyChart username and password.  Due to Covid, a mask is required upon entering the hospital/clinic. If you do not have a mask, one will be given to you upon arrival. For doctor visits, patients may have 1 support person aged 65 or older with them. For treatment visits, patients cannot have anyone with them due to current Covid guidelines and our immunocompromised population.

## 2021-03-02 NOTE — Progress Notes (Signed)
[facility] CONSULT NOTE  Patient Care Team: Revelo, Elyse Jarvis, MD as PCP - General (Family Medicine) Rico Junker, RN as Registered Nurse Christene Lye, MD (General Surgery) Clent Jacks, RN as Oncology Nurse Navigator  CHIEF COMPLAINTS/PURPOSE OF CONSULTATION: GALLBLADDER CANCER   #  Oncology History Overview Note  10/25- GALLBLADDER CANCER- [Dr.Cannon]/Dr.Byerly- laproscopic cholecystectomy- pT Category: pT3; pN1' POSITIVE for PNI; Histologic Type: Adenocarcinoma, biliary type  Histologic Grade: G3, poorly differentiated  Tumor Size: Greatest dimension: 2.2 cm  Tumor Extent: Tumor directly invades the liver  Lymphovascular Invasion: Present pre-treatement   Tumor Site: Fundus MARGINS  Margin Status for Invasive Carcinoma:      Margin Involved by Invasive Carcinoma: Liver parenchymal / hepatic  Bed; Number of Lymph Nodes with Tumor: 1       Number of Lymph Nodes Examined: 1    # October 25th 2021- Incidental gallbladder ADENOCA-s/p acute cholecystectomy [Dr.Cannon]. Stage III- T3N1; liver margin positive.  CT chest negative for any metastatic disease.  MRI abdomen with and without contrast-residual disease noted gallbladder bed; no evidence of distant metastases or lymphadenopathy. NOv 3rd 2021-CA 19-9 > 2000;     # NEOADJUVANT CHEMO-12/15-  Gem-cis [d1 & d-8 q 21 days] ; DEC 2021- Ca-19-03-4321.   June 14th, 2022- Dr.Byerly- METASTATIC DISEASE [. Multiple firm nodules were identified adherent to the anterior abdominal wall in both the RUQ as well as periumbilical area. These were primarily in the omentum, though one was between the liver and the abdominal wall.  Several of these nodules were excised and sent to pathology for frozen section, with preliminary pathology results consistent with adenocarcinoma]  # July 6th- Gem-Cis- Durva q21 days  # SURVIVORSHIP:   # GENETICS: [facility]-NGS- ?MSI*; FGF-Amplification;   DIAGNOSIS: gall  bladder ca   STAGE:  Stage Iv      ;  GOALS: PALlIATIVE  CURRENT/MOST RECENT THERAPY : chemo- Gem-Cis    Primary adenocarcinoma of gall bladder ([facility])  05/26/2020 Initial Diagnosis   Primary adenocarcinoma of gall bladder ([facility])   05/27/2020 Cancer Staging   Staging form: Gallbladder, AJCC 8th Edition - Clinical: Stage IIIB (cT3, cN1, cM0) - Signed by Cammie Sickle, MD on 08/05/2020    07/07/2020 -  Chemotherapy    Patient is on Treatment Plan: BILIARY TRACT CISPLATIN + GEMCITABINE D1,8 Q21D          HISTORY OF PRESENTING ILLNESS:  Kristina Orozco 65 y.o.  female stage IV adenocarcinoma gallbladder is currently on palliative chemotherapy-gemcitabine cisplatin-Durvalumab.  Patient denies any worsening abdominal distention.  Denies any nausea vomiting.  Leg swelling improved.  No worsening constipation.  Working part-time.  No tingling or numbness.  Review of Systems  Constitutional:  Negative for chills, diaphoresis, fever and malaise/fatigue.  HENT:  Negative for nosebleeds and sore throat.   Eyes:  Negative for double vision.  Respiratory:  Negative for cough, hemoptysis, sputum production, shortness of breath and wheezing.   Cardiovascular:  Negative for chest pain, palpitations, orthopnea and leg swelling.  Gastrointestinal:  Positive for abdominal pain and constipation. Negative for blood in stool, diarrhea, heartburn, melena, nausea and vomiting.  Genitourinary:  Negative for dysuria, frequency and urgency.  Musculoskeletal:  Negative for back pain and joint pain.  Skin: Negative.  Negative for itching and rash.  Neurological:  Negative for dizziness, tingling, focal weakness, weakness and headaches.  Endo/Heme/Allergies:  Does not bruise/bleed easily.  Psychiatric/Behavioral:  Negative for depression. The patient is not nervous/anxious and  does not have insomnia.     MEDICAL HISTORY:  Past Medical History:  Diagnosis Date   Cancer ([place]) 05/17/2020    gallbladder   Diabetes mellitus without complication ([place])    History of chemotherapy    started 06/2020   Hypertension     SURGICAL HISTORY: Past Surgical History:  Procedure Laterality Date   ABDOMINAL HYSTERECTOMY     APPENDECTOMY  2004   BREAST BIOPSY Left 2011   BREAST BIOPSY Right 08/26/13   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/28/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   CHOLECYSTECTOMY N/A 05/17/2020   Procedure: LAPAROSCOPIC CHOLECYSTECTOMY;  Surgeon: Fredirick Maudlin, MD;  Location: [facility] ORS;  Service: General;  Laterality: N/A;   COLONOSCOPY WITH PROPOFOL N/A 01/31/2018   Procedure: COLONOSCOPY WITH PROPOFOL;  Surgeon: Jonathon Bellows, MD;  Location: [facility] ENDOSCOPY;  Service: Gastroenterology;  Laterality: N/A;   LAPAROSCOPIC LYSIS OF ADHESIONS  01/04/2021   Procedure: LAPAROSCOPIC LYSIS OF ADHESIONS;  Surgeon: Stark Klein, MD;  Location: [facility];  Service: General;;  1 HOUR   LAPAROSCOPY N/A 01/04/2021   Procedure: LAPAROSCOPY DIAGNOSTIC;  Surgeon: Stark Klein, MD;  Location: [facility];  Service: General;  Laterality: N/A;  EPIDURAL   PORTACATH PLACEMENT Right 07/02/2020   Procedure: INSERTION PORT-A-CATH, chemotherapy;  Surgeon: Fredirick Maudlin, MD;  Location: [facility] ORS;  Service: General;  Laterality: Right;    SOCIAL HISTORY: Social History   Socioeconomic History   Marital status: Single    Spouse name: Not on file   Number of children: Not on file   Years of education: Not on file   Highest education level: Not on file  Occupational History   Occupation: special needs worker  Tobacco Use   Smoking status: Never   Smokeless tobacco: Never  Vaping Use   Vaping Use: Never used  Substance and Sexual Activity   Alcohol use: Yes    Alcohol/week: 2.0 standard drinks    Types: 2 Cans of beer per week    Comment: beer on weekend   Drug use: No   Sexual activity: Not Currently  Other Topics Concern   Not on file  Social History Narrative   Lives in [place]; with son. 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 Never smoked; beer/iqour over weekends.    Social Determinants of Health   Financial Resource Strain: Not on file  Food Insecurity: Not on file  Transportation Needs: Not on file  Physical Activity: Not on file  Stress: Not on file  Social Connections: Not on file  Intimate Partner Violence: Not on file    FAMILY HISTORY: Family History  Problem Relation Age of Onset   Pancreatic cancer Mother        AT 76 years   Breast cancer Sister        appx-55 years   Pancreatic cancer Maternal Grandmother     ALLERGIES:  is allergic to seasonal ic [cholestatin].  MEDICATIONS:  Current Outpatient Medications  Medication Sig Dispense Refill   Chlorpheniramine Maleate (ALLERGY RELIEF PO) Take 1 tablet by mouth daily as needed (allergies).     CINNAMON PO Take 1 tablet by mouth daily.     GARLIC PO Take 1 tablet by mouth daily.     glipiZIDE (GLUCOTROL) 5 MG tablet Take 1 tablet (5 mg total) by mouth 2 (two) times daily before a meal. 60 tablet 3   lidocaine-prilocaine (EMLA) cream Apply 1 application topically as needed. Apply on the port 30-45 mins prior to port access 30 g 1  losartan (COZAAR) 25 MG tablet Take 25 mg by mouth every morning.      Multiple Vitamins-Minerals (MULTIVITAMIN WITH MINERALS) tablet Take 1 tablet by mouth daily.     ondansetron (ZOFRAN) 8 MG tablet One pill every 8 hours as needed for nausea/vomitting. 40 tablet 1   oxyCODONE (OXY IR/ROXICODONE) 5 MG immediate release tablet Take 1-2 tablets (5-10 mg total) by mouth every 6 (six) hours as needed for severe pain. 60 tablet 0   potassium chloride SA (KLOR-CON) 20 MEQ tablet 1 pill twice a day 60 tablet 3   pravastatin (PRAVACHOL) 10 MG tablet Take 10 mg by mouth 4 (four) times a week.     prochlorperazine (COMPAZINE) 10 MG tablet Take 1 tablet (10 mg total) by mouth every 6 (six) hours as needed for nausea or vomiting. 40 tablet 1   No current facility-administered medications for  this visit.   Facility-Administered Medications Ordered in Other Visits  Medication Dose Route Frequency Provider Last Rate Last Admin   0.9 %  sodium chloride infusion   Intravenous Once Celia Gibbons, Lenetta Quaker R, MD       0.9 % NaCl with KCl 20 mEq/ L  infusion   Intravenous Once Cammie Sickle, MD       CISplatin (PLATINOL) 53 mg in sodium chloride 0.9 % 250 mL chemo infusion  25 mg/m2 (Treatment Plan Recorded) Intravenous Once Cammie Sickle, MD       dexamethasone (DECADRON) 10 mg in sodium chloride 0.9 % 50 mL IVPB  10 mg Intravenous Once Burnard Enis, Elisha [place]land, MD       durvalumab (IMFINZI) 1,500 mg in sodium chloride 0.9 % 100 mL chemo infusion  1,500 mg Intravenous Once Cammie Sickle, MD       fosaprepitant (EMEND) 150 mg in sodium chloride 0.9 % 145 mL IVPB  150 mg Intravenous Once Charlaine Dalton R, MD       gemcitabine (GEMZAR) 2,000 mg in sodium chloride 0.9 % 250 mL chemo infusion  2,000 mg Intravenous Once Charlaine Dalton R, MD       magnesium sulfate IVPB 2 g 50 mL  2 g Intravenous Once Charlaine Dalton R, MD       palonosetron (ALOXI) injection 0.25 mg  0.25 mg Intravenous Once Cammie Sickle, MD          .  PHYSICAL EXAMINATION: ECOG PERFORMANCE STATUS: 0 - Asymptomatic  Vitals:   03/02/21 0823  BP: (!) 143/78  Pulse: 95  Resp: 16  Temp: (!) 97.1 F (36.2 C)  SpO2: 100%   Filed Weights   03/02/21 0823  Weight: 208 lb 12.8 oz (94.7 kg)    Physical Exam HENT:     Head: Normocephalic and atraumatic.     Mouth/Throat:     Pharynx: No oropharyngeal exudate.  Eyes:     Pupils: Pupils are equal, round, and reactive to light.  Cardiovascular:     Rate and Rhythm: Normal rate and regular rhythm.  Pulmonary:     Effort: Pulmonary effort is normal. No respiratory distress.     Breath sounds: Normal breath sounds. No wheezing.  Abdominal:     General: Bowel sounds are normal. There is distension.     Palpations: Abdomen is  soft. There is no mass.     Tenderness: There is no abdominal tenderness. There is no guarding or rebound.  Musculoskeletal:        General: No tenderness. Normal range of motion.  Cervical back: Normal range of motion and neck supple.  Skin:    General: Skin is warm.  Neurological:     Mental Status: She is alert and oriented to person, place, and time.  Psychiatric:        Mood and Affect: Affect normal.     LABORATORY DATA:  I have reviewed the data as listed Lab Results  Component Value Date   WBC 15.1 (H) 03/02/2021   HGB 9.1 (L) 03/02/2021   HCT 29.0 (L) 03/02/2021   MCV 84.8 03/02/2021   PLT 179 03/02/2021   Recent Labs    01/26/21 0827 02/09/21 0824 02/16/21 0846 03/02/21 0803  NA 139 139 136 137  K 3.9 3.9 3.9 3.8  CL 102 104 105 104  CO2 _0 GLUCOSE 143* 187* 205* 173*  BUN _1 CREATININE 0.79 0.93 0.84 0.81  CALCIUM 8.4* 8.9 8.8* 8.8*  GFRNONAA >60 >60 >60 >60  PROT 6.6 7.2  --  7.0  ALBUMIN 3.1* 3.8  --  3.9  AST 30 28  --  22  ALT 25 14  --  12  ALKPHOS 102 90  --  87  BILITOT 0.5 0.1*  --  0.3    RADIOGRAPHIC STUDIES: I have personally reviewed the radiological images as listed and agreed with the findings in the report. No results found.   ASSESSMENT & PLAN:   Primary adenocarcinoma of gall bladder ([place]) #Metastatic gallbladder adenocarcinoma [ s/p neoadjuvant chemotherapy unfortunately noted to have metastatic disease on laparoscopy] .CA 19-9- June 2022- 1700. palliative chemotherapy-gemcitabine and cisplatin  + Durvalumab.  # Proceed with cycle #3- d-1 today. Labs today reviewed;  acceptable for treatment today.   # Abdominal distention/abdominal pain- likely malignant ascites; s/p US/paracentsis- STABLE.   # Blood glucose-s/p P-BF-BG-187 [HbA1c- May 5.5; increase glipizide 5 mg BID.STABLE.    # HTN-  continue with cozaar-STABLE  # Hypokalemia-- sec to cisplatin- improved; continue Kdur 20 meQ once a day; refilled  .  # DISPOSITION: # as planned in 1 week labs- cbc/bmp; Gem-Cis/onpro-  # follow up in 3 weeks/wed- MD: labs- cbc/cmp/mag; ca19-9;TSH;  Gem-Cis-Durvalumab; #  planned in 4 week labs- cbc/bmp; Gem-Cis/onpro- -Dr.B   All questions were answered. The patient knows to call the clinic with any problems, questions or concerns.   Cammie Sickle, MD 03/02/2021 9:09 AM

## 2021-03-02 NOTE — Progress Notes (Signed)
Nutrition  Complimentary case of ensure plus given to patient today.   Henri Guedes B. Jerriann Schrom, RD, LDN Registered Dietitian [phone] (mobile)   

## 2021-03-03 ENCOUNTER — Encounter: Payer: Self-pay | Admitting: Internal Medicine

## 2021-03-03 LAB — CANCER ANTIGEN 19-9: CA 19-9: 401 U/mL — ABNORMAL HIGH (ref 0–35)

## 2021-03-09 ENCOUNTER — Inpatient Hospital Stay: Payer: Medicare Other

## 2021-03-09 VITALS — BP 121/76 | HR 89 | Temp 96.9°F | Resp 18 | Ht 66.0 in | Wt 208.6 lb

## 2021-03-09 DIAGNOSIS — Z5112 Encounter for antineoplastic immunotherapy: Secondary | ICD-10-CM | POA: Diagnosis not present

## 2021-03-09 DIAGNOSIS — C23 Malignant neoplasm of gallbladder: Secondary | ICD-10-CM

## 2021-03-09 LAB — COMPREHENSIVE METABOLIC PANEL
ALT: 13 U/L (ref 0–44)
AST: 18 U/L (ref 15–41)
Albumin: 3.9 g/dL (ref 3.5–5.0)
Alkaline Phosphatase: 61 U/L (ref 38–126)
Anion gap: 7 (ref 5–15)
BUN: 24 mg/dL — ABNORMAL HIGH (ref 8–23)
CO2: 26 mmol/L (ref 22–32)
Calcium: 8.8 mg/dL — ABNORMAL LOW (ref 8.9–10.3)
Chloride: 104 mmol/L (ref 98–111)
Creatinine, Ser: 0.72 mg/dL (ref 0.44–1.00)
GFR, Estimated: >60 mL/min (ref >60)
Glucose, Bld: 142 mg/dL — ABNORMAL HIGH (ref 70–99)
Potassium: 4.1 mmol/L (ref 3.5–5.1)
Sodium: 137 mmol/L (ref 135–145)
Total Bilirubin: 0.5 mg/dL (ref 0.3–1.2)
Total Protein: 7.1 g/dL (ref 6.5–8.1)

## 2021-03-09 LAB — CBC WITH DIFFERENTIAL/PLATELET
Abs Immature Granulocytes: 0.01 10*3/uL (ref 0.00–0.07)
Basophils Absolute: 0.1 10*3/uL (ref 0.0–0.1)
Basophils Relative: 2 %
Eosinophils Absolute: 0 10*3/uL (ref 0.0–0.5)
Eosinophils Relative: 1 %
HCT: 27.1 % — ABNORMAL LOW (ref 36.0–46.0)
Hemoglobin: 8.5 g/dL — ABNORMAL LOW (ref 12.0–15.0)
Immature Granulocytes: 0 %
Lymphocytes Relative: 56 %
Lymphs Abs: 1.8 10*3/uL (ref 0.7–4.0)
MCH: 26.2 pg (ref 26.0–34.0)
MCHC: 31.4 g/dL (ref 30.0–36.0)
MCV: 83.4 fL (ref 80.0–100.0)
Monocytes Absolute: 0.5 10*3/uL (ref 0.1–1.0)
Monocytes Relative: 15 %
Neutro Abs: 0.8 10*3/uL — ABNORMAL LOW (ref 1.7–7.7)
Neutrophils Relative %: 26 %
Platelets: 218 10*3/uL (ref 150–400)
RBC: 3.25 MIL/uL — ABNORMAL LOW (ref 3.87–5.11)
RDW: 18.7 % — ABNORMAL HIGH (ref 11.5–15.5)
WBC: 3.2 10*3/uL — ABNORMAL LOW (ref 4.0–10.5)
nRBC: 0 % (ref 0.0–0.2)

## 2021-03-09 LAB — MAGNESIUM: Magnesium: 1.9 mg/dL (ref 1.7–2.4)

## 2021-03-09 MED ORDER — PEGFILGRASTIM 6 MG/0.6ML ~~LOC~~ PSKT
6.0000 mg | PREFILLED_SYRINGE | Freq: Once | SUBCUTANEOUS | Status: AC
  Administered 2021-03-09: 6 mg via SUBCUTANEOUS
  Filled 2021-03-09: qty 0.6

## 2021-03-09 MED ORDER — POTASSIUM CHLORIDE IN NACL 20-0.9 MEQ/L-% IV SOLN
Freq: Once | INTRAVENOUS | Status: AC
  Filled 2021-03-09: qty 1000

## 2021-03-09 MED ORDER — HEPARIN SOD (PORK) LOCK FLUSH 100 UNIT/ML IV SOLN
500.0000 [IU] | Freq: Once | INTRAVENOUS | Status: AC
  Administered 2021-03-09: 500 [IU] via INTRAVENOUS
  Filled 2021-03-09: qty 5

## 2021-03-09 MED ORDER — SODIUM CHLORIDE 0.9 % IV SOLN
150.0000 mg | Freq: Once | INTRAVENOUS | Status: AC
  Administered 2021-03-09: 150 mg via INTRAVENOUS
  Filled 2021-03-09: qty 150

## 2021-03-09 MED ORDER — PALONOSETRON HCL INJECTION 0.25 MG/5ML
0.2500 mg | Freq: Once | INTRAVENOUS | Status: AC
  Administered 2021-03-09: 0.25 mg via INTRAVENOUS
  Filled 2021-03-09: qty 5

## 2021-03-09 MED ORDER — SODIUM CHLORIDE 0.9 % IV SOLN
25.0000 mg/m2 | Freq: Once | INTRAVENOUS | Status: AC
  Administered 2021-03-09: 53 mg via INTRAVENOUS
  Filled 2021-03-09: qty 53

## 2021-03-09 MED ORDER — SODIUM CHLORIDE 0.9 % IV SOLN
Freq: Once | INTRAVENOUS | Status: DC
  Filled 2021-03-09: qty 250

## 2021-03-09 MED ORDER — SODIUM CHLORIDE 0.9 % IV SOLN
Freq: Once | INTRAVENOUS | Status: AC
  Filled 2021-03-09: qty 250

## 2021-03-09 MED ORDER — SODIUM CHLORIDE 0.9 % IV SOLN
10.0000 mg | Freq: Once | INTRAVENOUS | Status: AC
  Administered 2021-03-09: 10 mg via INTRAVENOUS
  Filled 2021-03-09: qty 10

## 2021-03-09 MED ORDER — SODIUM CHLORIDE 0.9% FLUSH
10.0000 mL | Freq: Once | INTRAVENOUS | Status: AC
  Administered 2021-03-09: 10 mL via INTRAVENOUS
  Filled 2021-03-09: qty 10

## 2021-03-09 MED ORDER — MAGNESIUM SULFATE 2 GM/50ML IV SOLN
2.0000 g | Freq: Once | INTRAVENOUS | Status: AC
  Administered 2021-03-09: 2 g via INTRAVENOUS
  Filled 2021-03-09: qty 50

## 2021-03-09 MED ORDER — HEPARIN SOD (PORK) LOCK FLUSH 100 UNIT/ML IV SOLN
INTRAVENOUS | Status: AC
  Filled 2021-03-09: qty 5

## 2021-03-09 MED ORDER — SODIUM CHLORIDE 0.9 % IV SOLN
2000.0000 mg | Freq: Once | INTRAVENOUS | Status: AC
  Administered 2021-03-09: 2000 mg via INTRAVENOUS
  Filled 2021-03-09: qty 52.6

## 2021-03-09 NOTE — Progress Notes (Signed)
ANC 0.8. MD aware and ok to continue treatment.

## 2021-03-09 NOTE — Progress Notes (Signed)
Nutrition Follow-up:   Patient with metastatic gall bladder cancer.  Patient receiving gemcitabine, cisplatin, durvalumab.    Met with patient during infusion.  Patient reports good appetite.  Denies nausea. Bowels moving well.  Eating at least 2-3 meals per day. Drinking ensure plus shakes about 1 time per day.     Medications: reviewed  Labs: reviewed  Anthropometrics:   Weight 208 lb 9.6 oz  205 lb on 7/27  213-217 lb    NUTRITION DIAGNOSIS: Unintentional weight loss stable    INTERVENTION:  Patient to continue well balanced diet including fruits, vegetables and lean protein.   Patient to contact RD if needed    MONITORING, EVALUATION, GOAL: weight trends, intake   NEXT VISIT: as needed  Sherry Blackard B. Zenia Resides, [place], [place] Registered Dietitian [phone] (mobile)

## 2021-03-09 NOTE — Patient Instructions (Signed)
[facility]  Discharge Instructions: Thank you for choosing [facility] to provide your oncology and hematology care.  If you have a lab appointment with the [facility], please go directly to the [facility] and check in at the registration area.  Wear comfortable clothing and clothing appropriate for easy access to any Portacath or PICC line.   We strive to give you quality time with your provider. You may need to reschedule your appointment if you arrive late (15 or more minutes).  Arriving late affects you and other patients whose appointments are after yours.  Also, if you miss three or more appointments without notifying the office, you may be dismissed from the clinic at the provider's discretion.      For prescription refill requests, have your pharmacy contact our office and allow 72 hours for refills to be completed.    Today you received the following chemotherapy and/or immunotherapy agents GEMZAR, CISPLATIN, NEULASTA      To help prevent nausea and vomiting after your treatment, we encourage you to take your nausea medication as directed.  BELOW ARE SYMPTOMS THAT SHOULD BE REPORTED IMMEDIATELY: *FEVER GREATER THAN 100.4 F (38 C) OR HIGHER *CHILLS OR SWEATING *NAUSEA AND VOMITING THAT IS NOT CONTROLLED WITH YOUR NAUSEA MEDICATION *UNUSUAL SHORTNESS OF BREATH *UNUSUAL BRUISING OR BLEEDING *URINARY PROBLEMS (pain or burning when urinating, or frequent urination) *BOWEL PROBLEMS (unusual diarrhea, constipation, pain near the anus) TENDERNESS IN MOUTH AND THROAT WITH OR WITHOUT PRESENCE OF ULCERS (sore throat, sores in mouth, or a toothache) UNUSUAL RASH, SWELLING OR PAIN  UNUSUAL VAGINAL DISCHARGE OR ITCHING   Items with * indicate a potential emergency and should be followed up as soon as possible or go to the Emergency Department if any problems should occur.  Please show the CHEMOTHERAPY ALERT CARD or IMMUNOTHERAPY ALERT  CARD at check-in to the Emergency Department and triage nurse.  Should you have questions after your visit or need to cancel or reschedule your appointment, please contact [facility]  [phone] and follow the prompts.  Office hours are 8:00 a.m. to 4:30 p.m. Monday - Friday. Please note that voicemails left after 4:00 p.m. may not be returned until the following business day.  We are closed weekends and major holidays. You have access to a nurse at all times for urgent questions. Please call the main number to the clinic [phone] and follow the prompts.  For any non-urgent questions, you may also contact your provider using MyChart. We now offer e-Visits for anyone 63 and older to request care online for non-urgent symptoms. For details visit [email or web address].   Also download the MyChart app! Go to the app store, search "MyChart", open the app, select [facility], and log in with your MyChart username and password.  Due to Covid, a mask is required upon entering the hospital/clinic. If you do not have a mask, one will be given to you upon arrival. For doctor visits, patients may have 1 support person aged 55 or older with them. For treatment visits, patients cannot have anyone with them due to current Covid guidelines and our immunocompromised population.   Gemcitabine injection What is this medication? GEMCITABINE (jem SYE ta been) is a chemotherapy drug. This medicine is used to treat many types of cancer like breast cancer, lung cancer, pancreatic cancer,and ovarian cancer. This medicine may be used for other purposes; ask your health care provider orpharmacist if you have questions. COMMON  BRAND NAME(S): Gemzar, Infugem What should I tell my care team before I take this medication? They need to know if you have any of these conditions: blood disorders infection kidney disease liver disease lung or breathing disease, like asthma recent or  ongoing radiation therapy an unusual or allergic reaction to gemcitabine, other chemotherapy, other medicines, foods, dyes, or preservatives pregnant or trying to get pregnant breast-feeding How should I use this medication? This drug is given as an infusion into a vein. It is administered in a hospitalor clinic by a specially trained health care professional. Talk to your pediatrician regarding the use of this medicine in children.Special care may be needed. Overdosage: If you think you have taken too much of this medicine contact apoison control center or emergency room at once. NOTE: This medicine is only for you. Do not share this medicine with others. What if I miss a dose? It is important not to miss your dose. Call your doctor or health careprofessional if you are unable to keep an appointment. What may interact with this medication? medicines to increase blood counts like filgrastim, pegfilgrastim, sargramostim some other chemotherapy drugs like cisplatin vaccines Talk to your doctor or health care professional before taking any of thesemedicines: acetaminophen aspirin ibuprofen ketoprofen naproxen This list may not describe all possible interactions. Give your health care provider a list of all the medicines, herbs, non-prescription drugs, or dietary supplements you use. Also tell them if you smoke, drink alcohol, or use illegaldrugs. Some items may interact with your medicine. What should I watch for while using this medication? Visit your doctor for checks on your progress. This drug may make you feel generally unwell. This is not uncommon, as chemotherapy can affect healthy cells as well as cancer cells. Report any side effects. Continue your course oftreatment even though you feel ill unless your doctor tells you to stop. In some cases, you may be given additional medicines to help with side effects.Follow all directions for their use. Call your doctor or health care  professional for advice if you get a fever, chills or sore throat, or other symptoms of a cold or flu. Do not treat yourself. This drug decreases your body's ability to fight infections. Try toavoid being around people who are sick. This medicine may increase your risk to bruise or bleed. Call your doctor orhealth care professional if you notice any unusual bleeding. Be careful brushing and flossing your teeth or using a toothpick because you may get an infection or bleed more easily. If you have any dental work done,tell your dentist you are receiving this medicine. Avoid taking products that contain aspirin, acetaminophen, ibuprofen, naproxen, or ketoprofen unless instructed by your doctor. These medicines may hide afever. Do not become pregnant while taking this medicine or for 6 months after stopping it. Women should inform their doctor if they wish to become pregnant or think they might be pregnant. Men should not father a child while taking this medicine and for 3 months after stopping it. There is a potential for serious side effects to an unborn child. Talk to your health care professional or pharmacist for more information. Do not breast-feed an infant while takingthis medicine or for at least 1 week after stopping it. Men should inform their doctors if they wish to father a child. This medicine may lower sperm counts. Talk with your doctor or health care professional ifyou are concerned about your fertility. What side effects may I notice from receiving this medication? Side effects  that you should report to your doctor or health care professionalas soon as possible: allergic reactions like skin rash, itching or hives, swelling of the face, lips, or tongue breathing problems pain, redness, or irritation at site where injected signs and symptoms of a dangerous change in heartbeat or heart rhythm like chest pain; dizziness; fast or irregular heartbeat; palpitations; feeling faint or lightheaded,  falls; breathing problems signs of decreased platelets or bleeding - bruising, pinpoint red spots on the skin, black, tarry stools, blood in the urine signs of decreased red blood cells - unusually weak or tired, feeling faint or lightheaded, falls signs of infection - fever or chills, cough, sore throat, pain or difficulty passing urine signs and symptoms of kidney injury like trouble passing urine or change in the amount of urine signs and symptoms of liver injury like dark yellow or brown urine; general ill feeling or flu-like symptoms; light-colored stools; loss of appetite; nausea; right upper belly pain; unusually weak or tired; yellowing of the eyes or skin swelling of ankles, feet, hands Side effects that usually do not require medical attention (report to yourdoctor or health care professional if they continue or are bothersome): constipation diarrhea hair loss loss of appetite nausea rash vomiting This list may not describe all possible side effects. Call your doctor for medical advice about side effects. You may report side effects to FDA at1-800-FDA-1088. Where should I keep my medication? This drug is given in a hospital or clinic and will not be stored at home. NOTE: This sheet is a summary. It may not cover all possible information. If you have questions about this medicine, talk to your doctor, pharmacist, orhealth care provider.  2022 Elsevier/Gold Standard (2017-10-03 18:06:11)  Cisplatin injection What is this medication? CISPLATIN (SIS pla tin) is a chemotherapy drug. It targets fast dividing cells, like cancer cells, and causes these cells to die. This medicine is used totreat many types of cancer like bladder, ovarian, and testicular cancers. This medicine may be used for other purposes; ask your health care provider orpharmacist if you have questions. COMMON BRAND NAME(S): Platinol, Platinol -AQ What should I tell my care team before I take this medication? They  need to know if you have any of these conditions: eye disease, vision problems hearing problems kidney disease low blood counts, like white cells, platelets, or red blood cells tingling of the fingers or toes, or other nerve disorder an unusual or allergic reaction to cisplatin, carboplatin, oxaliplatin, other medicines, foods, dyes, or preservatives pregnant or trying to get pregnant breast-feeding How should I use this medication? This drug is given as an infusion into a vein. It is administered in a hospitalor clinic by a specially trained health care professional. Talk to your pediatrician regarding the use of this medicine in children.Special care may be needed. Overdosage: If you think you have taken too much of this medicine contact apoison control center or emergency room at once. NOTE: This medicine is only for you. Do not share this medicine with others. What if I miss a dose? It is important not to miss a dose. Call your doctor or health careprofessional if you are unable to keep an appointment. What may interact with this medication? This medicine may interact with the following medications: foscarnet certain antibiotics like amikacin, gentamicin, neomycin, polymyxin B, streptomycin, tobramycin, vancomycin This list may not describe all possible interactions. Give your health care provider a list of all the medicines, herbs, non-prescription drugs, or dietary supplements you use. Also  tell them if you smoke, drink alcohol, or use illegaldrugs. Some items may interact with your medicine. What should I watch for while using this medication? Your condition will be monitored carefully while you are receiving this medicine. You will need important blood work done while you are taking thismedicine. This drug may make you feel generally unwell. This is not uncommon, as chemotherapy can affect healthy cells as well as cancer cells. Report any side effects. Continue your course of treatment  even though you feel ill unless yourdoctor tells you to stop. This medicine may increase your risk of getting an infection. Call your healthcare professional for advice if you get a fever, chills, or sore throat, or other symptoms of a cold or flu. Do not treat yourself. Try to avoid beingaround people who are sick. Avoid taking medicines that contain aspirin, acetaminophen, ibuprofen, naproxen, or ketoprofen unless instructed by your healthcare professional.These medicines may hide a fever. This medicine may increase your risk to bruise or bleed. Call your doctor orhealth care professional if you notice any unusual bleeding. Be careful brushing and flossing your teeth or using a toothpick because you may get an infection or bleed more easily. If you have any dental work done,tell your dentist you are receiving this medicine. Do not become pregnant while taking this medicine or for 14 months after stopping it. Women should inform their healthcare professional if they wish to become pregnant or think they might be pregnant. Men should not father a child while taking this medicine and for 11 months after stopping it. There is potential for serious side effects to an unborn child. Talk to your healthcareprofessional for more information. Do not breast-feed an infant while taking this medicine. This medicine has caused ovarian failure in some women. This medicine may make it more difficult to get pregnant. Talk to your healthcare professional if [place] concerned about your fertility. This medicine has caused decreased sperm counts in some men. This may make it more difficult to father a child. Talk to your healthcare professional if [place] concerned about your fertility. Drink fluids as directed while you are taking this medicine. This will helpprotect your kidneys. Call your doctor or health care professional if you get diarrhea. Do not treatyourself. What side effects may I notice from receiving this  medication? Side effects that you should report to your doctor or health care professionalas soon as possible: allergic reactions like skin rash, itching or hives, swelling of the face, lips, or tongue blurred vision changes in vision decreased hearing or ringing of the ears nausea, vomiting pain, redness, or irritation at site where injected pain, tingling, numbness in the hands or feet signs and symptoms of bleeding such as bloody or black, tarry stools; red or dark brown urine; spitting up blood or brown material that looks like coffee grounds; red spots on the skin; unusual bruising or bleeding from the eyes, gums, or nose signs and symptoms of infection like fever; chills; cough; sore throat; pain or trouble passing urine signs and symptoms of kidney injury like trouble passing urine or change in the amount of urine signs and symptoms of low red blood cells or anemia such as unusually weak or tired; feeling faint or lightheaded; falls; breathing problems Side effects that usually do not require medical attention (report to yourdoctor or health care professional if they continue or are bothersome): loss of appetite mouth sores muscle cramps This list may not describe all possible side effects. Call your doctor for medical advice  about side effects. You may report side effects to FDA at1-800-FDA-1088. Where should I keep my medication? This drug is given in a hospital or clinic and will not be stored at home. NOTE: This sheet is a summary. It may not cover all possible information. If you have questions about this medicine, talk to your doctor, pharmacist, orhealth care provider.  2022 Elsevier/Gold Standard (2018-07-05 15:59:17)  Pegfilgrastim injection What is this medication? PEGFILGRASTIM (PEG fil gra stim) is a long-acting granulocyte colony-stimulating factor that stimulates the growth of neutrophils, a type of white blood cell important in the body's fight against infection. It is  used to reduce the incidence of fever and infection in patients with certain types of cancer who are receiving chemotherapy that affects the bone marrow, and toincrease survival after being exposed to high doses of radiation. This medicine may be used for other purposes; ask your health care provider orpharmacist if you have questions. COMMON BRAND NAME(S): Rexene Edison, Ziextenzo What should I tell my care team before I take this medication? They need to know if you have any of these conditions: kidney disease latex allergy ongoing radiation therapy sickle cell disease skin reactions to acrylic adhesives (On-Body Injector only) an unusual or allergic reaction to pegfilgrastim, filgrastim, other medicines, foods, dyes, or preservatives pregnant or trying to get pregnant breast-feeding How should I use this medication? This medicine is for injection under the skin. If you get this medicine at home, you will be taught how to prepare and give the pre-filled syringe or how to use the On-body Injector. Refer to the patient Instructions for Use for detailed instructions. Use exactly as directed. Tell your healthcare provider immediately if you suspect that the On-body Injector may not have performed as intended or if you suspect the use of the On-body Injector resulted in a missedor partial dose. It is important that you put your used needles and syringes in a special sharps container. Do not put them in a trash can. If you do not have a sharpscontainer, call your pharmacist or healthcare provider to get one. Talk to your pediatrician regarding the use of this medicine in children. Whilethis drug may be prescribed for selected conditions, precautions do apply. Overdosage: If you think you have taken too much of this medicine contact apoison control center or emergency room at once. NOTE: This medicine is only for you. Do not share this medicine with others. What if I miss a  dose? It is important not to miss your dose. Call your doctor or health care professional if you miss your dose. If you miss a dose due to an On-body Injector failure or leakage, a new dose should be administered as soon aspossible using a single prefilled syringe for manual use. What may interact with this medication? Interactions have not been studied. This list may not describe all possible interactions. Give your health care provider a list of all the medicines, herbs, non-prescription drugs, or dietary supplements you use. Also tell them if you smoke, drink alcohol, or use illegaldrugs. Some items may interact with your medicine. What should I watch for while using this medication? Your condition will be monitored carefully while you are receiving thismedicine. You may need blood work done while you are taking this medicine. Talk to your health care provider about your risk of cancer. You may be more atrisk for certain types of cancer if you take this medicine. If you are going to need a MRI, CT scan, or other  procedure, tell your doctorthat you are using this medicine (On-Body Injector only). What side effects may I notice from receiving this medication? Side effects that you should report to your doctor or health care professionalas soon as possible: allergic reactions (skin rash, itching or hives, swelling of the face, lips, or tongue) back pain dizziness fever pain, redness, or irritation at site where injected pinpoint red spots on the skin red or dark-brown urine shortness of breath or breathing problems stomach or side pain, or pain at the shoulder swelling tiredness trouble passing urine or change in the amount of urine unusual bruising or bleeding Side effects that usually do not require medical attention (report to yourdoctor or health care professional if they continue or are bothersome): bone pain muscle pain This list may not describe all possible side effects. Call your  doctor for medical advice about side effects. You may report side effects to FDA at1-800-FDA-1088. Where should I keep my medication? Keep out of the reach of children. If you are using this medicine at home, you will be instructed on how to storeit. Throw away any unused medicine after the expiration date on the label. NOTE: This sheet is a summary. It may not cover all possible information. If you have questions about this medicine, talk to your doctor, pharmacist, orhealth care provider.  2022 Elsevier/Gold Standard (2020-08-06 11:54:14)

## 2021-03-23 ENCOUNTER — Encounter: Payer: Self-pay | Admitting: Internal Medicine

## 2021-03-23 ENCOUNTER — Inpatient Hospital Stay: Payer: Medicare Other

## 2021-03-23 ENCOUNTER — Inpatient Hospital Stay (HOSPITAL_BASED_OUTPATIENT_CLINIC_OR_DEPARTMENT_OTHER): Payer: Medicare Other | Admitting: Internal Medicine

## 2021-03-23 ENCOUNTER — Other Ambulatory Visit: Payer: Self-pay

## 2021-03-23 DIAGNOSIS — C23 Malignant neoplasm of gallbladder: Secondary | ICD-10-CM

## 2021-03-23 DIAGNOSIS — Z5112 Encounter for antineoplastic immunotherapy: Secondary | ICD-10-CM | POA: Diagnosis not present

## 2021-03-23 LAB — IRON AND TIBC
Iron: 60 ug/dL (ref 28–170)
Saturation Ratios: 20 % (ref 10.4–31.8)
TIBC: 300 ug/dL (ref 250–450)
UIBC: 240 ug/dL

## 2021-03-23 LAB — COMPREHENSIVE METABOLIC PANEL
ALT: 11 U/L (ref 0–44)
AST: 21 U/L (ref 15–41)
Albumin: 3.9 g/dL (ref 3.5–5.0)
Alkaline Phosphatase: 69 U/L (ref 38–126)
Anion gap: 9 (ref 5–15)
BUN: 16 mg/dL (ref 8–23)
CO2: 25 mmol/L (ref 22–32)
Calcium: 8.6 mg/dL — ABNORMAL LOW (ref 8.9–10.3)
Chloride: 103 mmol/L (ref 98–111)
Creatinine, Ser: 0.9 mg/dL (ref 0.44–1.00)
GFR, Estimated: >60 mL/min (ref >60)
Glucose, Bld: 172 mg/dL — ABNORMAL HIGH (ref 70–99)
Potassium: 3.7 mmol/L (ref 3.5–5.1)
Sodium: 137 mmol/L (ref 135–145)
Total Bilirubin: 0.3 mg/dL (ref 0.3–1.2)
Total Protein: 7.1 g/dL (ref 6.5–8.1)

## 2021-03-23 LAB — CBC WITH DIFFERENTIAL/PLATELET
Abs Immature Granulocytes: 0.16 10*3/uL — ABNORMAL HIGH (ref 0.00–0.07)
Basophils Absolute: 0 10*3/uL (ref 0.0–0.1)
Basophils Relative: 0 %
Eosinophils Absolute: 0.1 10*3/uL (ref 0.0–0.5)
Eosinophils Relative: 1 %
HCT: 24.9 % — ABNORMAL LOW (ref 36.0–46.0)
Hemoglobin: 7.7 g/dL — ABNORMAL LOW (ref 12.0–15.0)
Immature Granulocytes: 2 %
Lymphocytes Relative: 36 %
Lymphs Abs: 2.8 10*3/uL (ref 0.7–4.0)
MCH: 26.6 pg (ref 26.0–34.0)
MCHC: 30.9 g/dL (ref 30.0–36.0)
MCV: 86.2 fL (ref 80.0–100.0)
Monocytes Absolute: 1.4 10*3/uL — ABNORMAL HIGH (ref 0.1–1.0)
Monocytes Relative: 18 %
Neutro Abs: 3.4 10*3/uL (ref 1.7–7.7)
Neutrophils Relative %: 43 %
Platelets: 97 10*3/uL — ABNORMAL LOW (ref 150–400)
RBC: 2.89 MIL/uL — ABNORMAL LOW (ref 3.87–5.11)
RDW: 22.9 % — ABNORMAL HIGH (ref 11.5–15.5)
WBC: 7.8 10*3/uL (ref 4.0–10.5)
nRBC: 0 % (ref 0.0–0.2)

## 2021-03-23 LAB — MAGNESIUM: Magnesium: 1.9 mg/dL (ref 1.7–2.4)

## 2021-03-23 LAB — FERRITIN: Ferritin: 274 ng/mL (ref 11–307)

## 2021-03-23 LAB — TSH: TSH: 5.378 u[IU]/mL — ABNORMAL HIGH (ref 0.350–4.500)

## 2021-03-23 MED ORDER — MAGNESIUM SULFATE 2 GM/50ML IV SOLN
2.0000 g | Freq: Once | INTRAVENOUS | Status: AC
  Administered 2021-03-23: 2 g via INTRAVENOUS
  Filled 2021-03-23: qty 50

## 2021-03-23 MED ORDER — SODIUM CHLORIDE 0.9 % IV SOLN
25.0000 mg/m2 | Freq: Once | INTRAVENOUS | Status: AC
  Administered 2021-03-23: 53 mg via INTRAVENOUS
  Filled 2021-03-23: qty 53

## 2021-03-23 MED ORDER — SODIUM CHLORIDE 0.9 % IV SOLN
Freq: Once | INTRAVENOUS | Status: AC
  Filled 2021-03-23: qty 250

## 2021-03-23 MED ORDER — SODIUM CHLORIDE 0.9% FLUSH
10.0000 mL | INTRAVENOUS | Status: DC | PRN
  Administered 2021-03-23: 10 mL via INTRAVENOUS
  Filled 2021-03-23: qty 10

## 2021-03-23 MED ORDER — SODIUM CHLORIDE 0.9 % IV SOLN
2000.0000 mg | Freq: Once | INTRAVENOUS | Status: AC
  Administered 2021-03-23: 2000 mg via INTRAVENOUS
  Filled 2021-03-23: qty 52.6

## 2021-03-23 MED ORDER — HEPARIN SOD (PORK) LOCK FLUSH 100 UNIT/ML IV SOLN
INTRAVENOUS | Status: AC
  Administered 2021-03-23: 500 [IU]
  Filled 2021-03-23: qty 5

## 2021-03-23 MED ORDER — HEPARIN SOD (PORK) LOCK FLUSH 100 UNIT/ML IV SOLN
500.0000 [IU] | Freq: Once | INTRAVENOUS | Status: DC
  Filled 2021-03-23: qty 5

## 2021-03-23 MED ORDER — SODIUM CHLORIDE 0.9 % IV SOLN
150.0000 mg | Freq: Once | INTRAVENOUS | Status: AC
  Administered 2021-03-23: 150 mg via INTRAVENOUS
  Filled 2021-03-23: qty 150

## 2021-03-23 MED ORDER — SODIUM CHLORIDE 0.9 % IV SOLN
10.0000 mg | Freq: Once | INTRAVENOUS | Status: AC
  Administered 2021-03-23: 10 mg via INTRAVENOUS
  Filled 2021-03-23: qty 10

## 2021-03-23 MED ORDER — PALONOSETRON HCL INJECTION 0.25 MG/5ML
0.2500 mg | Freq: Once | INTRAVENOUS | Status: AC
  Administered 2021-03-23: 0.25 mg via INTRAVENOUS
  Filled 2021-03-23: qty 5

## 2021-03-23 MED ORDER — SODIUM CHLORIDE 0.9 % IV SOLN
1500.0000 mg | Freq: Once | INTRAVENOUS | Status: AC
  Administered 2021-03-23: 1500 mg via INTRAVENOUS
  Filled 2021-03-23: qty 30

## 2021-03-23 MED ORDER — HEPARIN SOD (PORK) LOCK FLUSH 100 UNIT/ML IV SOLN
500.0000 [IU] | Freq: Once | INTRAVENOUS | Status: AC | PRN
  Filled 2021-03-23: qty 5

## 2021-03-23 MED ORDER — POTASSIUM CHLORIDE IN NACL 20-0.9 MEQ/L-% IV SOLN
Freq: Once | INTRAVENOUS | Status: AC
  Filled 2021-03-23: qty 1000

## 2021-03-23 NOTE — Assessment & Plan Note (Addendum)
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 palliative chemotherapy-gemcitabine and cisplatin  + Durvalumab.  # Proceed with cycle #4- d-1 today. Labs today reviewed;  acceptable for treatment today- except Hb-7.7.   # Anemia sec to chemo; check irons tudies/ferritin. Possible 1 unit PRBC.next visit   # Abdominal distention/abdominal pain- likely malignant ascites; s/p US/paracentsis- STABLE.   # Blood glucose-s/p P-BF-BG-187 [HbA1c- May 5.5; increase glipizide 5 mg BID.STABLE.   # HTN-  continue with cozaar-STABLE.  # Reflux- recommend PPI-once a day; in AM.   # Left knee pain- Arthritis- NSDIAds prn.   # Hypokalemia- 3.7- sec to cisplatin- improved; continue Kdur 20 meQ once a day; refilled .  # DISPOSITION: iron studies/ferritin # chemo today  # as planned in 1 week [ labs- cbc/bmp; ABo-Rh-HOLD tube; possible 1 unit of PRBC; Gem-Cis/onpro]-   # follow up in 3 weeks/wed- MD: labs- cbc/cmp/mag; ca19-9;TSH;  Gem-Cis-Durvalumab;  #  planned in 4 week labs- cbc/bmp; Gem-Cis/onpro- -Dr.B

## 2021-03-23 NOTE — Patient Instructions (Signed)
[facility] ONCOLOGY  Discharge Instructions: Thank you for choosing [facility] to provide your oncology and hematology care.  If you have a lab appointment with the [facility], please go directly to the [facility] and check in at the registration area.  Wear comfortable clothing and clothing appropriate for easy access to any Portacath or PICC line.   We strive to give you quality time with your provider. You may need to reschedule your appointment if you arrive late (15 or more minutes).  Arriving late affects you and other patients whose appointments are after yours.  Also, if you miss three or more appointments without notifying the office, you may be dismissed from the clinic at the provider's discretion.      For prescription refill requests, have your pharmacy contact our office and allow 72 hours for refills to be completed.    Today you received the following chemotherapy and/or immunotherapy agents Gemzar, Cisplatin, Durvalumab      To help prevent nausea and vomiting after your treatment, we encourage you to take your nausea medication as directed.  BELOW ARE SYMPTOMS THAT SHOULD BE REPORTED IMMEDIATELY: *FEVER GREATER THAN 100.4 F (38 C) OR HIGHER *CHILLS OR SWEATING *NAUSEA AND VOMITING THAT IS NOT CONTROLLED WITH YOUR NAUSEA MEDICATION *UNUSUAL SHORTNESS OF BREATH *UNUSUAL BRUISING OR BLEEDING *URINARY PROBLEMS (pain or burning when urinating, or frequent urination) *BOWEL PROBLEMS (unusual diarrhea, constipation, pain near the anus) TENDERNESS IN MOUTH AND THROAT WITH OR WITHOUT PRESENCE OF ULCERS (sore throat, sores in mouth, or a toothache) UNUSUAL RASH, SWELLING OR PAIN  UNUSUAL VAGINAL DISCHARGE OR ITCHING   Items with * indicate a potential emergency and should be followed up as soon as possible or go to the Emergency Department if any problems should occur.  Please show the CHEMOTHERAPY ALERT CARD or IMMUNOTHERAPY  ALERT CARD at check-in to the Emergency Department and triage nurse.  Should you have questions after your visit or need to cancel or reschedule your appointment, please contact [facility]  [phone] and follow the prompts.  Office hours are 8:00 a.m. to 4:30 p.m. Monday - Friday. Please note that voicemails left after 4:00 p.m. may not be returned until the following business day.  We are closed weekends and major holidays. You have access to a nurse at all times for urgent questions. Please call the main number to the clinic [phone] and follow the prompts.  For any non-urgent questions, you may also contact your provider using MyChart. We now offer e-Visits for anyone 50 and older to request care online for non-urgent symptoms. For details visit [email or web address].   Also download the MyChart app! Go to the app store, search "MyChart", open the app, select [facility], and log in with your MyChart username and password.  Due to Covid, a mask is required upon entering the hospital/clinic. If you do not have a mask, one will be given to you upon arrival. For doctor visits, patients may have 1 support person aged 15 or older with them. For treatment visits, patients cannot have anyone with them due to current Covid guidelines and our immunocompromised population.

## 2021-03-23 NOTE — Progress Notes (Signed)
[facility] CONSULT NOTE  Patient Care Team: Revelo, Elyse Jarvis, MD as PCP - General (Family Medicine) Rico Junker, RN as Registered Nurse Christene Lye, MD (General Surgery) Clent Jacks, RN as Oncology Nurse Navigator  CHIEF COMPLAINTS/PURPOSE OF CONSULTATION: GALLBLADDER CANCER   #  Oncology History Overview Note  10/25- GALLBLADDER CANCER- [Dr.Cannon]/Dr.Byerly- laproscopic cholecystectomy- pT Category: pT3; pN1' POSITIVE for PNI; Histologic Type: Adenocarcinoma, biliary type  Histologic Grade: G3, poorly differentiated  Tumor Size: Greatest dimension: 2.2 cm  Tumor Extent: Tumor directly invades the liver  Lymphovascular Invasion: Present pre-treatement   Tumor Site: Fundus MARGINS  Margin Status for Invasive Carcinoma:      Margin Involved by Invasive Carcinoma: Liver parenchymal / hepatic  Bed; Number of Lymph Nodes with Tumor: 1       Number of Lymph Nodes Examined: 1    # October 25th 2021- Incidental gallbladder ADENOCA-s/p acute cholecystectomy [Dr.Cannon]. Stage III- T3N1; liver margin positive.  CT chest negative for any metastatic disease.  MRI abdomen with and without contrast-residual disease noted gallbladder bed; no evidence of distant metastases or lymphadenopathy. NOv 3rd 2021-CA 19-9 > 2000;     # NEOADJUVANT CHEMO-12/15-  Gem-cis [d1 & d-8 q 21 days] ; DEC 2021- Ca-19-03-4321.   June 14th, 2022- Dr.Byerly- METASTATIC DISEASE [. Multiple firm nodules were identified adherent to the anterior abdominal wall in both the RUQ as well as periumbilical area. These were primarily in the omentum, though one was between the liver and the abdominal wall.  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 Kristina Orozco 65 y.o.  female stage IV adenocarcinoma gallbladder is currently on palliative chemotherapy-gemcitabine cisplatin-Durvalumab.  No abdominal distention.  No nausea or vomiting.  However was to have reflux-like symptoms/burping.  No swelling in the legs.  Complains of fatigue.  Review of Systems  Constitutional:  Negative for chills, diaphoresis, fever and malaise/fatigue.  HENT:  Negative for nosebleeds and sore throat.   Eyes:  Negative for double vision.  Respiratory:  Negative for cough, hemoptysis, sputum production, shortness of breath and wheezing.   Cardiovascular:  Negative for chest pain, palpitations, orthopnea and leg swelling.  Gastrointestinal:  Positive for abdominal pain and constipation. Negative for blood in stool, diarrhea, heartburn, melena, nausea and vomiting.  Genitourinary:  Negative for dysuria, frequency and urgency.  Musculoskeletal:  Negative for back pain and joint pain.  Skin: Negative.  Negative for itching and rash.  Neurological:  Negative for dizziness, tingling, focal weakness, weakness and headaches.  Endo/Heme/Allergies:  Does not bruise/bleed easily.  Psychiatric/Behavioral:  Negative for depression. The patient is not nervous/anxious and does not  have insomnia.     MEDICAL HISTORY:  Past Medical History:  Diagnosis Date   Cancer ([facility]) 05/17/2020    gallbladder   Diabetes mellitus without complication ([facility])    History of chemotherapy    started 06/2020   Hypertension     SURGICAL HISTORY: Past Surgical History:  Procedure Laterality Date   ABDOMINAL HYSTERECTOMY     APPENDECTOMY  2004   BREAST BIOPSY Left 2011   BREAST BIOPSY Right 08/26/13   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/28/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   CHOLECYSTECTOMY N/A 05/17/2020   Procedure: LAPAROSCOPIC CHOLECYSTECTOMY;  Surgeon: Fredirick Maudlin, MD;  Location: [facility] ORS;  Service: General;  Laterality: N/A;   COLONOSCOPY WITH PROPOFOL N/A 01/31/2018   Procedure: COLONOSCOPY WITH PROPOFOL;  Surgeon: Jonathon Bellows, MD;  Location[facility];  Service: Gastroenterology;  Laterality: N/A;   LAPAROSCOPIC LYSIS OF ADHESIONS  01/04/2021   Procedure: LAPAROSCOPIC LYSIS OF ADHESIONS;  Surgeon: Stark Klein, MD;  Location: [facility];  Service: General;;  1 HOUR   LAPAROSCOPY N/A 01/04/2021   Procedure: LAPAROSCOPY DIAGNOSTIC;  Surgeon: Stark Klein, MD;  Location: [facility];  Service: General;  Laterality: N/A;  EPIDURAL   PORTACATH PLACEMENT Right 07/02/2020   Procedure: INSERTION PORT-A-CATH, chemotherapy;  Surgeon: Fredirick Maudlin, MD;  Location: [facility] ORS;  Service: General;  Laterality: Right;    SOCIAL HISTORY: Social History   Socioeconomic History   Marital status: Single    Spouse name: Not on file   Number of children: Not on file   Years of education: Not on file   Highest education level: Not on file  Occupational History   Occupation: special needs worker  Tobacco Use   Smoking status: Never   Smokeless tobacco: Never  Vaping Use   Vaping Use: Never used  Substance and Sexual Activity   Alcohol use: Yes    Alcohol/week: 2.0 standard drinks    Types: 2 Cans of beer per week    Comment: beer on weekend   Drug use: No   Sexual activity: Not Currently  Other Topics Concern   Not on file  Social History Narrative   Lives in [place]; with son. 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 Never smoked; beer/iqour over weekends.    Social Determinants of Health   Financial Resource Strain: Not on file  Food Insecurity: Not on file  Transportation Needs: Not on file  Physical Activity: Not on file  Stress: Not on file  Social Connections: Not on file  Intimate Partner Violence: Not on file    FAMILY HISTORY: Family History  Problem Relation Age of Onset   Pancreatic cancer Mother        AT 88 years   Breast cancer Sister        appx-55 years   Pancreatic cancer Maternal Grandmother     ALLERGIES:  is allergic to seasonal ic [cholestatin].  MEDICATIONS:  Current Outpatient Medications  Medication Sig Dispense Refill   Chlorpheniramine Maleate (ALLERGY RELIEF PO) Take 1 tablet by mouth daily as needed (allergies).     CINNAMON PO Take 1 tablet by mouth daily.     GARLIC PO Take 1 tablet by mouth daily.     glipiZIDE (GLUCOTROL) 5 MG tablet Take 1 tablet (5 mg total) by mouth 2 (two) times daily before a meal. 60 tablet 3   lidocaine-prilocaine (EMLA) cream Apply 1 application topically as needed. Apply on the port 30-45 mins prior to port access 30 g 1   losartan (  COZAAR) 25 MG tablet Take 25 mg by mouth every morning.      Multiple Vitamins-Minerals (MULTIVITAMIN WITH MINERALS) tablet Take 1 tablet by mouth daily.     ondansetron (ZOFRAN) 8 MG tablet One pill every 8 hours as needed for nausea/vomitting. 40 tablet 1   oxyCODONE (OXY IR/ROXICODONE) 5 MG immediate release tablet Take 1-2 tablets (5-10 mg total) by mouth every 6 (six) hours as needed for severe pain. 60 tablet 0   potassium chloride SA (KLOR-CON) 20 MEQ tablet 1 pill twice a day 60 tablet 3   pravastatin (PRAVACHOL) 10 MG tablet Take 10 mg by mouth 4 (four) times a week.     prochlorperazine (COMPAZINE) 10 MG tablet Take 1 tablet (10 mg total) by mouth every 6 (six) hours as needed for nausea or vomiting. 40 tablet 1   No current facility-administered medications for  this visit.   Facility-Administered Medications Ordered in Other Visits  Medication Dose Route Frequency Provider Last Rate Last Admin   CISplatin (PLATINOL) 53 mg in sodium chloride 0.9 % 250 mL chemo infusion  25 mg/m2 (Treatment Plan Recorded) Intravenous Once Cammie Sickle, MD       dexamethasone (DECADRON) 10 mg in sodium chloride 0.9 % 50 mL IVPB  10 mg Intravenous Once Arlyss Weathersby, Elisha Headland, MD       durvalumab (IMFINZI) 1,500 mg in sodium chloride 0.9 % 100 mL chemo infusion  1,500 mg Intravenous Once Cammie Sickle, MD       fosaprepitant (EMEND) 150 mg in sodium chloride 0.9 % 145 mL IVPB  150 mg Intravenous Once Cammie Sickle, MD       gemcitabine (GEMZAR) 2,000 mg in sodium chloride 0.9 % 250 mL chemo infusion  2,000 mg Intravenous Once Charlaine Dalton R, MD       heparin lock flush 100 UNIT/ML injection            heparin lock flush 100 unit/mL  500 Units Intravenous Once Charlaine Dalton R, MD       heparin lock flush 100 unit/mL  500 Units Intracatheter Once PRN Cammie Sickle, MD       magnesium sulfate IVPB 2 g 50 mL  2 g Intravenous Once Cammie Sickle, MD 50 mL/hr at 03/23/21 0918 2 g at 03/23/21 0918   palonosetron (ALOXI) injection 0.25 mg  0.25 mg Intravenous Once Charlaine Dalton R, MD       sodium chloride flush (NS) 0.9 % injection 10 mL  10 mL Intravenous PRN Cammie Sickle, MD   10 mL at 03/23/21 0823      .  PHYSICAL EXAMINATION: ECOG PERFORMANCE STATUS: 0 - Asymptomatic  Vitals:   03/23/21 0830  BP: 138/74  Pulse: 91  Resp: 16  Temp: 98.4 F (36.9 C)  SpO2: 100%   Filed Weights   03/23/21 0830  Weight: 210 lb (95.3 kg)    Physical Exam HENT:     Head: Normocephalic and atraumatic.     Mouth/Throat:     Pharynx: No oropharyngeal exudate.  Eyes:     Pupils: Pupils are equal, round, and reactive to light.  Cardiovascular:     Rate and Rhythm: Normal rate and regular rhythm.  Pulmonary:      Effort: Pulmonary effort is normal. No respiratory distress.     Breath sounds: Normal breath sounds. No wheezing.  Abdominal:     General: Bowel sounds are normal. There is distension.     Palpations: Abdomen  is soft. There is no mass.     Tenderness: There is no abdominal tenderness. There is no guarding or rebound.  Musculoskeletal:        General: No tenderness. Normal range of motion.     Cervical back: Normal range of motion and neck supple.  Skin:    General: Skin is warm.  Neurological:     Mental Status: She is alert and oriented to person, place, and time.  Psychiatric:        Mood and Affect: Affect normal.     LABORATORY DATA:  I have reviewed the data as listed Lab Results  Component Value Date   WBC 7.8 03/23/2021   HGB 7.7 (L) 03/23/2021   HCT 24.9 (L) 03/23/2021   MCV 86.2 03/23/2021   PLT 97 (L) 03/23/2021   Recent Labs    03/02/21 0803 03/09/21 0817 03/23/21 0823  NA 137 137 137  K 3.8 4.1 3.7  CL 104 104 103  CO2 _0 GLUCOSE 173* 142* 172*  BUN 17 24* 16  CREATININE 0.81 0.72 0.90  CALCIUM 8.8* 8.8* 8.6*  GFRNONAA >60 >60 >60  PROT 7.0 7.1 7.1  ALBUMIN 3.9 3.9 3.9  AST _1 ALT _2 ALKPHOS 87 61 69  BILITOT 0.3 0.5 0.3    RADIOGRAPHIC STUDIES: I have personally reviewed the radiological images as listed and agreed with the findings in the report. No results found.   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 palliative chemotherapy-gemcitabine and cisplatin  + Durvalumab.  # Proceed with cycle #4- d-1 today. Labs today reviewed;  acceptable for treatment today- except Hb-7.7.   # Anemia sec to chemo; check irons tudies/ferritin. Possible 1 unit PRBC.next visit   # Abdominal distention/abdominal pain- likely malignant ascites; s/p  US/paracentsis- STABLE.   # Blood glucose-s/p P-BF-BG-187 [HbA1c- May 5.5; increase glipizide 5 mg BID.STABLE.   # HTN-  continue with cozaar-STABLE.  # Reflux- recommend PPI-once a day; in AM.   # Left knee pain- Arthritis- NSDIAds prn.   # Hypokalemia- 3.7- sec to cisplatin- improved; continue Kdur 20 meQ once a day; refilled .  # DISPOSITION: iron studies/ferritin # chemo today  # as planned in 1 week [ labs- cbc/bmp; ABo-Rh-HOLD tube; possible 1 unit of PRBC; Gem-Cis/onpro]-   # follow up in 3 weeks/wed- MD: labs- cbc/cmp/mag; ca19-9;TSH;  Gem-Cis-Durvalumab;  #  planned in 4 week labs- cbc/bmp; Gem-Cis/onpro- -Dr.B   All questions were answered. The patient knows to call the clinic with any problems, questions or concerns.   Cammie Sickle, MD 03/23/2021 10:11 AM

## 2021-03-23 NOTE — Patient Instructions (Signed)
#   prilosec OTC- 1 pill in AM; 1 hour to Breakfast.

## 2021-03-24 LAB — CANCER ANTIGEN 19-9: CA 19-9: 260 U/mL — ABNORMAL HIGH (ref 0–35)

## 2021-03-30 ENCOUNTER — Inpatient Hospital Stay: Payer: Medicare Other

## 2021-03-30 ENCOUNTER — Inpatient Hospital Stay: Payer: Medicare Other | Attending: Oncology

## 2021-03-30 DIAGNOSIS — Z5112 Encounter for antineoplastic immunotherapy: Secondary | ICD-10-CM | POA: Diagnosis not present

## 2021-03-30 DIAGNOSIS — Z5111 Encounter for antineoplastic chemotherapy: Secondary | ICD-10-CM | POA: Insufficient documentation

## 2021-03-30 DIAGNOSIS — C23 Malignant neoplasm of gallbladder: Secondary | ICD-10-CM | POA: Insufficient documentation

## 2021-03-30 DIAGNOSIS — Z95828 Presence of other vascular implants and grafts: Secondary | ICD-10-CM

## 2021-03-30 DIAGNOSIS — D6481 Anemia due to antineoplastic chemotherapy: Secondary | ICD-10-CM | POA: Insufficient documentation

## 2021-03-30 DIAGNOSIS — C787 Secondary malignant neoplasm of liver and intrahepatic bile duct: Secondary | ICD-10-CM | POA: Insufficient documentation

## 2021-03-30 DIAGNOSIS — Z79899 Other long term (current) drug therapy: Secondary | ICD-10-CM | POA: Insufficient documentation

## 2021-03-30 LAB — CBC WITH DIFFERENTIAL/PLATELET
Abs Immature Granulocytes: 0.01 10*3/uL (ref 0.00–0.07)
Basophils Absolute: 0 10*3/uL (ref 0.0–0.1)
Basophils Relative: 1 %
Eosinophils Absolute: 0 10*3/uL (ref 0.0–0.5)
Eosinophils Relative: 1 %
HCT: 23 % — ABNORMAL LOW (ref 36.0–46.0)
Hemoglobin: 7.3 g/dL — ABNORMAL LOW (ref 12.0–15.0)
Immature Granulocytes: 0 %
Lymphocytes Relative: 70 %
Lymphs Abs: 1.8 10*3/uL (ref 0.7–4.0)
MCH: 27 pg (ref 26.0–34.0)
MCHC: 31.7 g/dL (ref 30.0–36.0)
MCV: 85.2 fL (ref 80.0–100.0)
Monocytes Absolute: 0.3 10*3/uL (ref 0.1–1.0)
Monocytes Relative: 10 %
Neutro Abs: 0.5 10*3/uL — ABNORMAL LOW (ref 1.7–7.7)
Neutrophils Relative %: 18 %
Platelets: 162 10*3/uL (ref 150–400)
RBC: 2.7 MIL/uL — ABNORMAL LOW (ref 3.87–5.11)
RDW: 23.3 % — ABNORMAL HIGH (ref 11.5–15.5)
WBC: 2.6 10*3/uL — ABNORMAL LOW (ref 4.0–10.5)
nRBC: 0 % (ref 0.0–0.2)

## 2021-03-30 LAB — COMPREHENSIVE METABOLIC PANEL
ALT: 12 U/L (ref 0–44)
AST: 20 U/L (ref 15–41)
Albumin: 4 g/dL (ref 3.5–5.0)
Alkaline Phosphatase: 59 U/L (ref 38–126)
Anion gap: 6 (ref 5–15)
BUN: 24 mg/dL — ABNORMAL HIGH (ref 8–23)
CO2: 24 mmol/L (ref 22–32)
Calcium: 8.9 mg/dL (ref 8.9–10.3)
Chloride: 104 mmol/L (ref 98–111)
Creatinine, Ser: 0.82 mg/dL (ref 0.44–1.00)
GFR, Estimated: >60 mL/min (ref >60)
Glucose, Bld: 142 mg/dL — ABNORMAL HIGH (ref 70–99)
Potassium: 3.7 mmol/L (ref 3.5–5.1)
Sodium: 134 mmol/L — ABNORMAL LOW (ref 135–145)
Total Bilirubin: <0.1 mg/dL — ABNORMAL LOW (ref 0.3–1.2)
Total Protein: 7.3 g/dL (ref 6.5–8.1)

## 2021-03-30 LAB — MAGNESIUM: Magnesium: 1.9 mg/dL (ref 1.7–2.4)

## 2021-03-30 MED ORDER — SODIUM CHLORIDE 0.9% FLUSH
10.0000 mL | Freq: Once | INTRAVENOUS | Status: AC
  Administered 2021-03-30: 10 mL via INTRAVENOUS
  Filled 2021-03-30: qty 10

## 2021-03-30 MED ORDER — HEPARIN SOD (PORK) LOCK FLUSH 100 UNIT/ML IV SOLN
500.0000 [IU] | Freq: Once | INTRAVENOUS | Status: AC
  Filled 2021-03-30: qty 5

## 2021-03-30 MED ORDER — HEPARIN SOD (PORK) LOCK FLUSH 100 UNIT/ML IV SOLN
INTRAVENOUS | Status: AC
  Administered 2021-03-30: 500 [IU] via INTRAVENOUS
  Filled 2021-03-30: qty 5

## 2021-03-30 NOTE — Progress Notes (Signed)
ANC: 500, Hemoglobin: 7.3 today. MD, Dr. Rogue Bussing, notified and aware. Per MD order: hold Gemzar and Cisplatin treatment today; patient to keep follow-up appointment as already scheduled on, 04/13/2021. Patient informed and verbalized understanding.

## 2021-03-31 ENCOUNTER — Inpatient Hospital Stay: Payer: Medicare Other

## 2021-03-31 LAB — ABO/RH: ABO/RH(D): A POS

## 2021-03-31 LAB — SAMPLE TO BLOOD BANK

## 2021-04-07 ENCOUNTER — Telehealth: Payer: Self-pay | Admitting: *Deleted

## 2021-04-07 NOTE — Telephone Encounter (Signed)
Patient called reporting that she has allergies and the drainage at night is causing her to cough and not sleep as well. She is asking if Dr B could prescribe something for her allergies. Please advise

## 2021-04-07 NOTE — Telephone Encounter (Signed)
Patient tested covid-19 was negative. She denies any fever. She has tried Engineer, production plus. She states that she wants to try mucinex or otc delysm. Pt instructed to continue to hydrate. Ok to take otc medications as recommended She will msg our team via mychart by Monday to let our office know how she is feeling.

## 2021-04-13 ENCOUNTER — Inpatient Hospital Stay: Payer: Medicare Other

## 2021-04-13 ENCOUNTER — Inpatient Hospital Stay (HOSPITAL_BASED_OUTPATIENT_CLINIC_OR_DEPARTMENT_OTHER): Payer: Medicare Other | Admitting: Internal Medicine

## 2021-04-13 ENCOUNTER — Encounter: Payer: Self-pay | Admitting: Internal Medicine

## 2021-04-13 DIAGNOSIS — Z5112 Encounter for antineoplastic immunotherapy: Secondary | ICD-10-CM | POA: Diagnosis not present

## 2021-04-13 DIAGNOSIS — C23 Malignant neoplasm of gallbladder: Secondary | ICD-10-CM

## 2021-04-13 LAB — TSH: TSH: 5.857 u[IU]/mL — ABNORMAL HIGH (ref 0.350–4.500)

## 2021-04-13 LAB — COMPREHENSIVE METABOLIC PANEL
ALT: 11 U/L (ref 0–44)
AST: 19 U/L (ref 15–41)
Albumin: 4 g/dL (ref 3.5–5.0)
Alkaline Phosphatase: 52 U/L (ref 38–126)
Anion gap: 9 (ref 5–15)
BUN: 19 mg/dL (ref 8–23)
CO2: 26 mmol/L (ref 22–32)
Calcium: 9.1 mg/dL (ref 8.9–10.3)
Chloride: 103 mmol/L (ref 98–111)
Creatinine, Ser: 0.92 mg/dL (ref 0.44–1.00)
GFR, Estimated: >60 mL/min (ref >60)
Glucose, Bld: 152 mg/dL — ABNORMAL HIGH (ref 70–99)
Potassium: 3.7 mmol/L (ref 3.5–5.1)
Sodium: 138 mmol/L (ref 135–145)
Total Bilirubin: 0.5 mg/dL (ref 0.3–1.2)
Total Protein: 7.2 g/dL (ref 6.5–8.1)

## 2021-04-13 LAB — CBC WITH DIFFERENTIAL/PLATELET
Abs Immature Granulocytes: 0.03 10*3/uL (ref 0.00–0.07)
Basophils Absolute: 0 10*3/uL (ref 0.0–0.1)
Basophils Relative: 1 %
Eosinophils Absolute: 0.2 10*3/uL (ref 0.0–0.5)
Eosinophils Relative: 3 %
HCT: 27.8 % — ABNORMAL LOW (ref 36.0–46.0)
Hemoglobin: 8.5 g/dL — ABNORMAL LOW (ref 12.0–15.0)
Immature Granulocytes: 1 %
Lymphocytes Relative: 39 %
Lymphs Abs: 2.5 10*3/uL (ref 0.7–4.0)
MCH: 27.2 pg (ref 26.0–34.0)
MCHC: 30.6 g/dL (ref 30.0–36.0)
MCV: 88.8 fL (ref 80.0–100.0)
Monocytes Absolute: 1.1 10*3/uL — ABNORMAL HIGH (ref 0.1–1.0)
Monocytes Relative: 17 %
Neutro Abs: 2.6 10*3/uL (ref 1.7–7.7)
Neutrophils Relative %: 39 %
Platelets: 373 10*3/uL (ref 150–400)
RBC: 3.13 MIL/uL — ABNORMAL LOW (ref 3.87–5.11)
RDW: 25.5 % — ABNORMAL HIGH (ref 11.5–15.5)
WBC: 6.5 10*3/uL (ref 4.0–10.5)
nRBC: 0 % (ref 0.0–0.2)

## 2021-04-13 LAB — MAGNESIUM: Magnesium: 1.9 mg/dL (ref 1.7–2.4)

## 2021-04-13 LAB — SAMPLE TO BLOOD BANK

## 2021-04-13 MED ORDER — MAGNESIUM SULFATE 2 GM/50ML IV SOLN
2.0000 g | Freq: Once | INTRAVENOUS | Status: AC
  Administered 2021-04-13: 2 g via INTRAVENOUS
  Filled 2021-04-13: qty 50

## 2021-04-13 MED ORDER — HEPARIN SOD (PORK) LOCK FLUSH 100 UNIT/ML IV SOLN
500.0000 [IU] | Freq: Once | INTRAVENOUS | Status: AC | PRN
  Filled 2021-04-13: qty 5

## 2021-04-13 MED ORDER — PALONOSETRON HCL INJECTION 0.25 MG/5ML
0.2500 mg | Freq: Once | INTRAVENOUS | Status: AC
  Administered 2021-04-13: 0.25 mg via INTRAVENOUS
  Filled 2021-04-13: qty 5

## 2021-04-13 MED ORDER — HEPARIN SOD (PORK) LOCK FLUSH 100 UNIT/ML IV SOLN
INTRAVENOUS | Status: AC
  Filled 2021-04-13: qty 5

## 2021-04-13 MED ORDER — SODIUM CHLORIDE 0.9 % IV SOLN
150.0000 mg | Freq: Once | INTRAVENOUS | Status: AC
  Administered 2021-04-13: 150 mg via INTRAVENOUS
  Filled 2021-04-13: qty 150

## 2021-04-13 MED ORDER — SODIUM CHLORIDE 0.9 % IV SOLN
20.0000 mg/m2 | Freq: Once | INTRAVENOUS | Status: AC
  Administered 2021-04-13: 42 mg via INTRAVENOUS
  Filled 2021-04-13: qty 42

## 2021-04-13 MED ORDER — SODIUM CHLORIDE 0.9 % IV SOLN
Freq: Once | INTRAVENOUS | Status: AC
  Filled 2021-04-13: qty 250

## 2021-04-13 MED ORDER — SODIUM CHLORIDE 0.9 % IV SOLN
1500.0000 mg | Freq: Once | INTRAVENOUS | Status: AC
  Administered 2021-04-13: 1500 mg via INTRAVENOUS
  Filled 2021-04-13: qty 30

## 2021-04-13 MED ORDER — HEPARIN SOD (PORK) LOCK FLUSH 100 UNIT/ML IV SOLN
INTRAVENOUS | Status: AC
  Administered 2021-04-13: 500 [IU]
  Filled 2021-04-13: qty 5

## 2021-04-13 MED ORDER — POTASSIUM CHLORIDE IN NACL 20-0.9 MEQ/L-% IV SOLN
Freq: Once | INTRAVENOUS | Status: AC
  Filled 2021-04-13: qty 1000

## 2021-04-13 MED ORDER — SODIUM CHLORIDE 0.9% FLUSH
10.0000 mL | Freq: Once | INTRAVENOUS | Status: AC
  Administered 2021-04-13: 10 mL via INTRAVENOUS
  Filled 2021-04-13: qty 10

## 2021-04-13 MED ORDER — HEPARIN SOD (PORK) LOCK FLUSH 100 UNIT/ML IV SOLN
500.0000 [IU] | Freq: Once | INTRAVENOUS | Status: DC
  Filled 2021-04-13: qty 5

## 2021-04-13 MED ORDER — SODIUM CHLORIDE 0.9 % IV SOLN
800.0000 mg/m2 | Freq: Once | INTRAVENOUS | Status: AC
  Administered 2021-04-13: 1710 mg via INTRAVENOUS
  Filled 2021-04-13: qty 26.3

## 2021-04-13 MED ORDER — SODIUM CHLORIDE 0.9 % IV SOLN
10.0000 mg | Freq: Once | INTRAVENOUS | Status: AC
  Administered 2021-04-13: 10 mg via INTRAVENOUS
  Filled 2021-04-13: qty 10

## 2021-04-13 MED ORDER — SODIUM CHLORIDE 0.9 % IV SOLN
Freq: Once | INTRAVENOUS | Status: DC
  Filled 2021-04-13: qty 250

## 2021-04-13 NOTE — Progress Notes (Signed)
Pt in for follow up and treatment, states cold symptoms and cough have greatly improved.  Pt also states appetite is better. Pt had a 2 lb weight gain since last visit.

## 2021-04-13 NOTE — Patient Instructions (Signed)
[facility] ONCOLOGY  Discharge Instructions: Thank you for choosing [facility] to provide your oncology and hematology care.  If you have a lab appointment with the [facility], please go directly to the [facility] and check in at the registration area.  Wear comfortable clothing and clothing appropriate for easy access to any Portacath or PICC line.   We strive to give you quality time with your provider. You may need to reschedule your appointment if you arrive late (15 or more minutes).  Arriving late affects you and other patients whose appointments are after yours.  Also, if you miss three or more appointments without notifying the office, you may be dismissed from the clinic at the provider's discretion.      For prescription refill requests, have your pharmacy contact our office and allow 72 hours for refills to be completed.    Today you received the following chemotherapy and/or immunotherapy agents Durvalumab, Gemzar and Cisplatin       To help prevent nausea and vomiting after your treatment, we encourage you to take your nausea medication as directed.  BELOW ARE SYMPTOMS THAT SHOULD BE REPORTED IMMEDIATELY: *FEVER GREATER THAN 100.4 F (38 C) OR HIGHER *CHILLS OR SWEATING *NAUSEA AND VOMITING THAT IS NOT CONTROLLED WITH YOUR NAUSEA MEDICATION *UNUSUAL SHORTNESS OF BREATH *UNUSUAL BRUISING OR BLEEDING *URINARY PROBLEMS (pain or burning when urinating, or frequent urination) *BOWEL PROBLEMS (unusual diarrhea, constipation, pain near the anus) TENDERNESS IN MOUTH AND THROAT WITH OR WITHOUT PRESENCE OF ULCERS (sore throat, sores in mouth, or a toothache) UNUSUAL RASH, SWELLING OR PAIN  UNUSUAL VAGINAL DISCHARGE OR ITCHING   Items with * indicate a potential emergency and should be followed up as soon as possible or go to the Emergency Department if any problems should occur.  Please show the CHEMOTHERAPY ALERT CARD or IMMUNOTHERAPY  ALERT CARD at check-in to the Emergency Department and triage nurse.  Should you have questions after your visit or need to cancel or reschedule your appointment, please contact [facility]  [phone] and follow the prompts.  Office hours are 8:00 a.m. to 4:30 p.m. Monday - Friday. Please note that voicemails left after 4:00 p.m. may not be returned until the following business day.  We are closed weekends and major holidays. You have access to a nurse at all times for urgent questions. Please call the main number to the clinic [phone] and follow the prompts.  For any non-urgent questions, you may also contact your provider using MyChart. We now offer e-Visits for anyone 74 and older to request care online for non-urgent symptoms. For details visit [email or web address].   Also download the MyChart app! Go to the app store, search "MyChart", open the app, select [facility], and log in with your MyChart username and password.  Due to Covid, a mask is required upon entering the hospital/clinic. If you do not have a mask, one will be given to you upon arrival. For doctor visits, patients may have 1 support person aged 79 or older with them. For treatment visits, patients cannot have anyone with them due to current Covid guidelines and our immunocompromised population.

## 2021-04-13 NOTE — Progress Notes (Signed)
[facility] CONSULT NOTE  Patient Care Team: Revelo, Elyse Jarvis, MD as PCP - General (Family Medicine) Rico Junker, RN as Registered Nurse Christene Lye, MD (General Surgery) Clent Jacks, RN as Oncology Nurse Navigator  CHIEF COMPLAINTS/PURPOSE OF CONSULTATION: GALLBLADDER CANCER   #  Oncology History Overview Note  10/25- GALLBLADDER CANCER- [Dr.Cannon]/Dr.Byerly- laproscopic cholecystectomy- pT Category: pT3; pN1' POSITIVE for PNI; Histologic Type: Adenocarcinoma, biliary type  Histologic Grade: G3, poorly differentiated  Tumor Size: Greatest dimension: 2.2 cm  Tumor Extent: Tumor directly invades the liver  Lymphovascular Invasion: Present pre-treatement   Tumor Site: Fundus MARGINS  Margin Status for Invasive Carcinoma:      Margin Involved by Invasive Carcinoma: Liver parenchymal / hepatic  Bed; Number of Lymph Nodes with Tumor: 1       Number of Lymph Nodes Examined: 1    # October 25th 2021- Incidental gallbladder ADENOCA-s/p acute cholecystectomy [Dr.Cannon]. Stage III- T3N1; liver margin positive.  CT chest negative for any metastatic disease.  MRI abdomen with and without contrast-residual disease noted gallbladder bed; no evidence of distant metastases or lymphadenopathy. NOv 3rd 2021-CA 19-9 > 2000;     # NEOADJUVANT CHEMO-12/15-  Gem-cis [d1 & d-8 q 21 days] ; DEC 2021- Ca-[id number].   June 14th, 2022- Dr.Byerly- METASTATIC DISEASE [. Multiple firm nodules were identified adherent to the anterior abdominal wall in both the RUQ as well as periumbilical area. These were primarily in the omentum, though one was between the liver and the abdominal wall.  Several of these nodules were excised and sent to pathology for frozen section, with preliminary pathology results consistent with adenocarcinoma]  # July 6th- Gem-Cis- Durva q21 days  9/21- cycle6day#1-decrease dose of gemcitabine to 80 mg meter square; cisplatin to 20 mg of  metered square; same dose of Durva; day 8-no changes; on pro.   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 Kristina Orozco 65 y.o.  female stage IV adenocarcinoma gallbladder is currently on palliative chemotherapy-gemcitabine cisplatin-Durvalumab.  Patient's chemotherapy cycle number 5-day 15-had to be held because of hemoglobin 7.3 A1c 0.5.   Patient's appetite is good.  No nausea no vomiting.  No swelling in the legs.  No infections.  Review of Systems  Constitutional:  Negative for chills, diaphoresis, fever and malaise/fatigue.  HENT:  Negative for nosebleeds and sore throat.   Eyes:  Negative for double vision.  Respiratory:  Negative for cough, hemoptysis, sputum production, shortness of breath and wheezing.   Cardiovascular:  Negative for chest pain, palpitations, orthopnea and leg swelling.  Gastrointestinal:  Positive for constipation. Negative for blood in stool, diarrhea, heartburn, melena, nausea and vomiting.  Genitourinary:  Negative for dysuria, frequency and urgency.  Musculoskeletal:  Negative for back pain and joint pain.  Skin: Negative.  Negative for itching  and rash.  Neurological:  Negative for dizziness, tingling, focal weakness, weakness and headaches.  Endo/Heme/Allergies:  Does not bruise/bleed easily.  Psychiatric/Behavioral:   Negative for depression. The patient is not nervous/anxious and does not have insomnia.     MEDICAL HISTORY:  Past Medical History:  Diagnosis Date  . Cancer ([place]) 05/17/2020   gallbladder  . Diabetes mellitus without complication ([place])   . History of chemotherapy    started 06/2020  . Hypertension     SURGICAL HISTORY: Past Surgical History:  Procedure Laterality Date  . ABDOMINAL HYSTERECTOMY    . APPENDECTOMY  2004  . BREAST BIOPSY Left 2011  . BREAST BIOPSY Right 08/26/13  . 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/28/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  . CHOLECYSTECTOMY N/A 05/17/2020   Procedure: LAPAROSCOPIC CHOLECYSTECTOMY;  Surgeon: Fredirick Maudlin, MD;  Location: [facility] ORS;  Service: General;  Laterality: N/A;  . COLONOSCOPY WITH PROPOFOL N/A 01/31/2018   Procedure: COLONOSCOPY WITH PROPOFOL;  Surgeon: Jonathon Bellows, MD;  Location: [facility] ENDOSCOPY;  Service: Gastroenterology;  Laterality: N/A;  . LAPAROSCOPIC LYSIS OF ADHESIONS  01/04/2021   Procedure: LAPAROSCOPIC LYSIS OF ADHESIONS;  Surgeon: Stark Klein, MD;  Location: [facility];  Service: General;;  1 HOUR  . LAPAROSCOPY N/A 01/04/2021   Procedure: LAPAROSCOPY DIAGNOSTIC;  Surgeon: Stark Klein, MD;  Location: [facility];  Service: General;  Laterality: N/A;  EPIDURAL  . PORTACATH PLACEMENT Right 07/02/2020   Procedure: INSERTION PORT-A-CATH, chemotherapy;  Surgeon: Fredirick Maudlin, MD;  Location: [facility] ORS;  Service: General;  Laterality: Right;    SOCIAL HISTORY: Social History   Socioeconomic History  . Marital status: Single    Spouse name: Not on file  . Number of children: Not on file  . Years of education: Not on file  . Highest education level: Not on file  Occupational History  . Occupation: special needs worker  Tobacco Use  . Smoking status: Never  . Smokeless tobacco: Never  Vaping Use  . Vaping Use: Never used  Substance and Sexual Activity  . Alcohol use: Yes    Alcohol/week: 2.0 standard drinks    Types: 2 Cans of beer per week     Comment: beer on weekend  . Drug use: No  . Sexual activity: Not Currently  Other Topics Concern  . Not on file  Social History Narrative   Lives in [place]; with son. 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 Never smoked; beer/iqour over weekends.    Social Determinants of Health   Financial Resource Strain: Not on file  Food Insecurity: Not on file  Transportation Needs: Not on file  Physical Activity: Not on file  Stress: Not on file  Social Connections: Not on file  Intimate Partner Violence: Not on file    FAMILY HISTORY: Family History  Problem Relation Age of Onset  . Pancreatic cancer Mother        AT 72 years  . Breast cancer Sister        appx-55 years  . Pancreatic cancer Maternal Grandmother     ALLERGIES:  is allergic to seasonal ic [cholestatin].  MEDICATIONS:  Current Outpatient Medications  Medication Sig Dispense Refill  . CINNAMON PO Take 1 tablet by mouth daily.    Marland Kitchen GARLIC PO Take 1 tablet by mouth daily.    Marland Kitchen glipiZIDE (GLUCOTROL) 5 MG tablet Take 1 tablet (5 mg total) by mouth 2 (two) times daily before a meal. 60 tablet 3  . lidocaine-prilocaine (EMLA) cream Apply 1 application topically as  needed. Apply on the port 30-45 mins prior to port access 30 g 1  . losartan (COZAAR) 25 MG tablet Take 25 mg by mouth every morning.     . Multiple Vitamins-Minerals (MULTIVITAMIN WITH MINERALS) tablet Take 1 tablet by mouth daily.    . pravastatin (PRAVACHOL) 10 MG tablet Take 10 mg by mouth 4 (four) times a week.    . Chlorpheniramine Maleate (ALLERGY RELIEF PO) Take 1 tablet by mouth daily as needed (allergies). (Patient not taking: Reported on 04/13/2021)    . ondansetron (ZOFRAN) 8 MG tablet One pill every 8 hours as needed for nausea/vomitting. (Patient not taking: Reported on 04/13/2021) 40 tablet 1  . oxyCODONE (OXY IR/ROXICODONE) 5 MG immediate release tablet Take 1-2 tablets (5-10 mg total) by mouth every 6 (six) hours as needed for  severe pain. (Patient not taking: Reported on 04/13/2021) 60 tablet 0  . potassium chloride SA (KLOR-CON) 20 MEQ tablet 1 pill twice a day (Patient not taking: Reported on 04/13/2021) 60 tablet 3  . prochlorperazine (COMPAZINE) 10 MG tablet Take 1 tablet (10 mg total) by mouth every 6 (six) hours as needed for nausea or vomiting. (Patient not taking: Reported on 04/13/2021) 40 tablet 1   No current facility-administered medications for this visit.   Facility-Administered Medications Ordered in Other Visits  Medication Dose Route Frequency Provider Last Rate Last Admin  . 0.9 %  sodium chloride infusion   Intravenous Once Charlaine Dalton R, MD      . CISplatin (PLATINOL) 42 mg in sodium chloride 0.9 % 250 mL chemo infusion  20 mg/m2 (Treatment Plan Recorded) Intravenous Once Cammie Sickle, MD      . durvalumab (IMFINZI) 1,500 mg in sodium chloride 0.9 % 100 mL chemo infusion  1,500 mg Intravenous Once Charlaine Dalton R, MD      . gemcitabine (GEMZAR) 1,710 mg in sodium chloride 0.9 % 250 mL chemo infusion  800 mg/m2 (Treatment Plan Recorded) Intravenous Once Charlaine Dalton R, MD      . heparin lock flush 100 UNIT/ML injection           . heparin lock flush 100 UNIT/ML injection           . heparin lock flush 100 unit/mL  500 Units Intravenous Once Charlaine Dalton R, MD      . heparin lock flush 100 unit/mL  500 Units Intracatheter Once PRN Cammie Sickle, MD          .  PHYSICAL EXAMINATION: ECOG PERFORMANCE STATUS: 0 - Asymptomatic  Vitals:   04/13/21 0840  BP: (!) 147/83  Pulse: 90  Resp: 16  Temp: 97.7 F (36.5 C)  SpO2: 100%   Filed Weights   04/13/21 0840  Weight: 212 lb (96.2 kg)    Physical Exam HENT:     Head: Normocephalic and atraumatic.     Mouth/Throat:     Pharynx: No oropharyngeal exudate.  Eyes:     Pupils: Pupils are equal, round, and reactive to light.  Cardiovascular:     Rate and Rhythm: Normal rate and regular rhythm.   Pulmonary:     Effort: Pulmonary effort is normal. No respiratory distress.     Breath sounds: Normal breath sounds. No wheezing.  Abdominal:     General: Bowel sounds are normal. There is distension.     Palpations: Abdomen is soft. There is no mass.     Tenderness: There is no abdominal tenderness. There is no guarding or rebound.  Musculoskeletal:        General: No tenderness. Normal range of motion.     Cervical back: Normal range of motion and neck supple.  Skin:    General: Skin is warm.  Neurological:     Mental Status: She is alert and oriented to person, place, and time.  Psychiatric:        Mood and Affect: Affect normal.     LABORATORY DATA:  I have reviewed the data as listed Lab Results  Component Value Date   WBC 6.5 04/13/2021   HGB 8.5 (L) 04/13/2021   HCT 27.8 (L) 04/13/2021   MCV 88.8 04/13/2021   PLT 373 04/13/2021   Recent Labs    03/23/21 0823 03/30/21 0824 04/13/21 0810  NA 137 134* 138  K 3.7 3.7 3.7  CL 103 104 103  CO2 '25 24 26  ' GLUCOSE 172* 142* 152*  BUN 16 24* 19  CREATININE 0.90 0.82 0.92  CALCIUM 8.6* 8.9 9.1  GFRNONAA >60 >60 >60  PROT 7.1 7.3 7.2  ALBUMIN 3.9 4.0 4.0  AST '21 20 19  ' ALT '11 12 11  ' ALKPHOS 69 59 52  BILITOT 0.3 <0.1* 0.5    RADIOGRAPHIC STUDIES: I have personally reviewed the radiological images as listed and agreed with the findings in the report. No results found.   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 palliative chemotherapy-gemcitabine and cisplatin  + Durvalumab.  # Proceed with cycle #6- d-1 today. Labs today reviewed;  acceptable for treatment today- except Hb-8.3. Day#1-dose reduce-20% gemcitabine; cisplatin 20 mg per metered square.  We will plan repeat imaging after this cycle in 3 weeks.  # Anemia sec to  chemo-hemoglobin today is 8.3; hold off PRBC transfusion.  # Abdominal distention/abdominal pain- likely malignant ascites; s/p US/paracentsis- STABLE..   # Blood glucose-s/p P-BF-BG-156 [HbA1c- May 5.5; increase glipizide 5 mg BID- STABLE.   # HTN-  continue with cozaar-STABLE.   # Reflux- recommend PPI-once a day; in AM.   # Left knee pain- Arthritis- NSDIAds prn.   # Hypokalemia- 3.7- sec to cisplatin- improved; continue Kdur 20 meQ once a day- STABLE.   # DISPOSITION: # chemo today; NO PRBC today  # as planned in 1 week [ labs- cbc/bmp; HOLD tube; possible 1 unit of PRBC; Gem-Cis/onpro]-    # follow up in 3 weeks/wed- MD: labs- cbc/cmp/mag; ca19-9;TSH;  Gem-Cis-Durvalumab;CT CAP  #  planned in 4 week labs- cbc/bmp; Gem-Cis/onpro- -Dr.B   All questions were answered. The patient knows to call the clinic with any problems, questions or concerns.   Cammie Sickle, MD 04/13/2021 12:27 PM

## 2021-04-13 NOTE — Assessment & Plan Note (Addendum)
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 palliative chemotherapy-gemcitabine and cisplatin  + Durvalumab.  # Proceed with cycle #6- d-1 today. Labs today reviewed;  acceptable for treatment today- except Hb-8.3. Day#1-dose reduce-20% gemcitabine; cisplatin 20 mg per metered square.  We will plan repeat imaging after this cycle in 3 weeks.  # Anemia sec to chemo-hemoglobin today is 8.3; hold off PRBC transfusion.  # Abdominal distention/abdominal pain- likely malignant ascites; s/p US/paracentsis- STABLE..   # Blood glucose-s/p P-BF-BG-156 [HbA1c- May 5.5; increase glipizide 5 mg BID- STABLE.   # HTN-  continue with cozaar-STABLE.   # Reflux- recommend PPI-once a day; in AM.   # Left knee pain- Arthritis- NSDIAds prn.   # Hypokalemia- 3.7- sec to cisplatin- improved; continue Kdur 20 meQ once a day- STABLE.   # DISPOSITION: # chemo today; NO PRBC today  # as planned in 1 week [ labs- cbc/bmp; HOLD tube; possible 1 unit of PRBC; Gem-Cis/onpro]-    # follow up in 3 weeks/wed- MD: labs- cbc/cmp/mag; ca19-9;TSH;  Gem-Cis-Durvalumab;CT CAP  #  planned in 4 week labs- cbc/bmp; Gem-Cis/onpro- -Dr.B

## 2021-04-14 LAB — CANCER ANTIGEN 19-9: CA 19-9: 1100 U/mL — ABNORMAL HIGH (ref 0–35)

## 2021-04-20 ENCOUNTER — Inpatient Hospital Stay: Payer: Medicare Other

## 2021-04-20 ENCOUNTER — Ambulatory Visit: Payer: Medicare Other | Admitting: Internal Medicine

## 2021-04-20 VITALS — BP 126/59 | HR 90 | Temp 97.0°F | Resp 17

## 2021-04-20 DIAGNOSIS — Z5112 Encounter for antineoplastic immunotherapy: Secondary | ICD-10-CM | POA: Diagnosis not present

## 2021-04-20 DIAGNOSIS — C23 Malignant neoplasm of gallbladder: Secondary | ICD-10-CM

## 2021-04-20 LAB — CBC WITH DIFFERENTIAL/PLATELET
Abs Immature Granulocytes: 0.01 10*3/uL (ref 0.00–0.07)
Basophils Absolute: 0 10*3/uL (ref 0.0–0.1)
Basophils Relative: 1 %
Eosinophils Absolute: 0.1 10*3/uL (ref 0.0–0.5)
Eosinophils Relative: 1 %
HCT: 26.7 % — ABNORMAL LOW (ref 36.0–46.0)
Hemoglobin: 8.2 g/dL — ABNORMAL LOW (ref 12.0–15.0)
Immature Granulocytes: 0 %
Lymphocytes Relative: 49 %
Lymphs Abs: 2 10*3/uL (ref 0.7–4.0)
MCH: 27.4 pg (ref 26.0–34.0)
MCHC: 30.7 g/dL (ref 30.0–36.0)
MCV: 89.3 fL (ref 80.0–100.0)
Monocytes Absolute: 0.3 10*3/uL (ref 0.1–1.0)
Monocytes Relative: 8 %
Neutro Abs: 1.7 10*3/uL (ref 1.7–7.7)
Neutrophils Relative %: 41 %
Platelets: 200 10*3/uL (ref 150–400)
RBC: 2.99 MIL/uL — ABNORMAL LOW (ref 3.87–5.11)
RDW: 23.4 % — ABNORMAL HIGH (ref 11.5–15.5)
WBC: 4.1 10*3/uL (ref 4.0–10.5)
nRBC: 0 % (ref 0.0–0.2)

## 2021-04-20 LAB — COMPREHENSIVE METABOLIC PANEL
ALT: 13 U/L (ref 0–44)
AST: 18 U/L (ref 15–41)
Albumin: 4.2 g/dL (ref 3.5–5.0)
Alkaline Phosphatase: 57 U/L (ref 38–126)
Anion gap: 8 (ref 5–15)
BUN: 25 mg/dL — ABNORMAL HIGH (ref 8–23)
CO2: 25 mmol/L (ref 22–32)
Calcium: 8.9 mg/dL (ref 8.9–10.3)
Chloride: 102 mmol/L (ref 98–111)
Creatinine, Ser: 0.97 mg/dL (ref 0.44–1.00)
GFR, Estimated: >60 mL/min (ref >60)
Glucose, Bld: 135 mg/dL — ABNORMAL HIGH (ref 70–99)
Potassium: 4.3 mmol/L (ref 3.5–5.1)
Sodium: 135 mmol/L (ref 135–145)
Total Bilirubin: 0.5 mg/dL (ref 0.3–1.2)
Total Protein: 7.6 g/dL (ref 6.5–8.1)

## 2021-04-20 LAB — SAMPLE TO BLOOD BANK

## 2021-04-20 LAB — MAGNESIUM: Magnesium: 1.9 mg/dL (ref 1.7–2.4)

## 2021-04-20 MED ORDER — SODIUM CHLORIDE 0.9 % IV SOLN
10.0000 mg | Freq: Once | INTRAVENOUS | Status: AC
  Administered 2021-04-20: 10 mg via INTRAVENOUS
  Filled 2021-04-20: qty 10

## 2021-04-20 MED ORDER — POTASSIUM CHLORIDE IN NACL 20-0.9 MEQ/L-% IV SOLN
Freq: Once | INTRAVENOUS | Status: AC
  Filled 2021-04-20: qty 1000

## 2021-04-20 MED ORDER — SODIUM CHLORIDE 0.9% FLUSH
10.0000 mL | INTRAVENOUS | Status: AC | PRN
  Administered 2021-04-20: 10 mL via INTRAVENOUS
  Filled 2021-04-20: qty 10

## 2021-04-20 MED ORDER — SODIUM CHLORIDE 0.9 % IV SOLN
Freq: Once | INTRAVENOUS | Status: DC
  Filled 2021-04-20: qty 250

## 2021-04-20 MED ORDER — SODIUM CHLORIDE 0.9 % IV SOLN
150.0000 mg | Freq: Once | INTRAVENOUS | Status: AC
  Administered 2021-04-20: 150 mg via INTRAVENOUS
  Filled 2021-04-20: qty 150

## 2021-04-20 MED ORDER — HEPARIN SOD (PORK) LOCK FLUSH 100 UNIT/ML IV SOLN
500.0000 [IU] | Freq: Once | INTRAVENOUS | Status: AC
  Filled 2021-04-20: qty 5

## 2021-04-20 MED ORDER — HEPARIN SOD (PORK) LOCK FLUSH 100 UNIT/ML IV SOLN
500.0000 [IU] | Freq: Once | INTRAVENOUS | Status: DC | PRN
  Filled 2021-04-20: qty 5

## 2021-04-20 MED ORDER — MAGNESIUM SULFATE 2 GM/50ML IV SOLN
2.0000 g | Freq: Once | INTRAVENOUS | Status: AC
  Administered 2021-04-20: 2 g via INTRAVENOUS
  Filled 2021-04-20: qty 50

## 2021-04-20 MED ORDER — HEPARIN SOD (PORK) LOCK FLUSH 100 UNIT/ML IV SOLN
INTRAVENOUS | Status: AC
  Administered 2021-04-20: 500 [IU]
  Filled 2021-04-20: qty 5

## 2021-04-20 MED ORDER — SODIUM CHLORIDE 0.9 % IV SOLN
25.0000 mg/m2 | Freq: Once | INTRAVENOUS | Status: AC
  Administered 2021-04-20: 53 mg via INTRAVENOUS
  Filled 2021-04-20: qty 53

## 2021-04-20 MED ORDER — PALONOSETRON HCL INJECTION 0.25 MG/5ML
0.2500 mg | Freq: Once | INTRAVENOUS | Status: AC
Start: 2021-04-20 — End: 2021-04-20
  Administered 2021-04-20: 0.25 mg via INTRAVENOUS
  Filled 2021-04-20: qty 5

## 2021-04-20 MED ORDER — SODIUM CHLORIDE 0.9 % IV SOLN
2000.0000 mg | Freq: Once | INTRAVENOUS | Status: AC
  Administered 2021-04-20: 2000 mg via INTRAVENOUS
  Filled 2021-04-20: qty 52.6

## 2021-04-20 MED ORDER — SODIUM CHLORIDE 0.9 % IV SOLN
Freq: Once | INTRAVENOUS | Status: AC
  Filled 2021-04-20: qty 250

## 2021-04-20 MED ORDER — PEGFILGRASTIM 6 MG/0.6ML ~~LOC~~ PSKT
6.0000 mg | PREFILLED_SYRINGE | Freq: Once | SUBCUTANEOUS | Status: AC
  Administered 2021-04-20: 6 mg via SUBCUTANEOUS
  Filled 2021-04-20: qty 0.6

## 2021-04-20 NOTE — Progress Notes (Signed)
Confirmed dose of cisplatin 25mg /m2 today, only reduced D1 dose per MD

## 2021-04-20 NOTE — Patient Instructions (Signed)
[facility]  Discharge Instructions: Thank you for choosing [facility] to provide your oncology and hematology care.  If you have a lab appointment with the [facility], please go directly to the [facility] and check in at the registration area.  Wear comfortable clothing and clothing appropriate for easy access to any Portacath or PICC line.   We strive to give you quality time with your provider. You may need to reschedule your appointment if you arrive late (15 or more minutes).  Arriving late affects you and other patients whose appointments are after yours.  Also, if you miss three or more appointments without notifying the office, you may be dismissed from the clinic at the provider's discretion.      For prescription refill requests, have your pharmacy contact our office and allow 72 hours for refills to be completed.    Today you received the following chemotherapy and/or immunotherapy agents Gemzar and Cisplatin       To help prevent nausea and vomiting after your treatment, we encourage you to take your nausea medication as directed.  BELOW ARE SYMPTOMS THAT SHOULD BE REPORTED IMMEDIATELY: *FEVER GREATER THAN 100.4 F (38 C) OR HIGHER *CHILLS OR SWEATING *NAUSEA AND VOMITING THAT IS NOT CONTROLLED WITH YOUR NAUSEA MEDICATION *UNUSUAL SHORTNESS OF BREATH *UNUSUAL BRUISING OR BLEEDING *URINARY PROBLEMS (pain or burning when urinating, or frequent urination) *BOWEL PROBLEMS (unusual diarrhea, constipation, pain near the anus) TENDERNESS IN MOUTH AND THROAT WITH OR WITHOUT PRESENCE OF ULCERS (sore throat, sores in mouth, or a toothache) UNUSUAL RASH, SWELLING OR PAIN  UNUSUAL VAGINAL DISCHARGE OR ITCHING   Items with * indicate a potential emergency and should be followed up as soon as possible or go to the Emergency Department if any problems should occur.  Please show the CHEMOTHERAPY ALERT CARD or IMMUNOTHERAPY ALERT CARD  at check-in to the Emergency Department and triage nurse.  Should you have questions after your visit or need to cancel or reschedule your appointment, please contact [facility]  [phone] and follow the prompts.  Office hours are 8:00 a.m. to 4:30 p.m. Monday - Friday. Please note that voicemails left after 4:00 p.m. may not be returned until the following business day.  We are closed weekends and major holidays. You have access to a nurse at all times for urgent questions. Please call the main number to the clinic [phone] and follow the prompts.  For any non-urgent questions, you may also contact your provider using MyChart. We now offer e-Visits for anyone 77 and older to request care online for non-urgent symptoms. For details visit [email or web address].   Also download the MyChart app! Go to the app store, search "MyChart", open the app, select [facility], and log in with your MyChart username and password.  Due to Covid, a mask is required upon entering the hospital/clinic. If you do not have a mask, one will be given to you upon arrival. For doctor visits, patients may have 1 support person aged 13 or older with them. For treatment visits, patients cannot have anyone with them due to current Covid guidelines and our immunocompromised population.

## 2021-04-21 ENCOUNTER — Inpatient Hospital Stay: Payer: Medicare Other

## 2021-05-02 ENCOUNTER — Ambulatory Visit: Payer: Medicare Other

## 2021-05-03 ENCOUNTER — Ambulatory Visit
Admission: RE | Admit: 2021-05-03 | Discharge: 2021-05-03 | Disposition: A | Discharge status: Home / Self Care | Payer: Medicare Other | Source: Ambulatory Visit | Attending: Internal Medicine | Admitting: Internal Medicine

## 2021-05-03 ENCOUNTER — Other Ambulatory Visit: Payer: Self-pay

## 2021-05-03 DIAGNOSIS — C23 Malignant neoplasm of gallbladder: Secondary | ICD-10-CM | POA: Insufficient documentation

## 2021-05-03 IMAGING — CT CT CHEST-ABD-PELV W/ CM
2 of 5 series · 12 of 36 positions shown, 14 images · IV contrast (omnipaque)
Comparison: Multiple exams, including abdominal MR from [DATE]
and chest CT from [DATE]

CLINICAL DATA: Restaging gallbladder cancer. Current chemotherapy
and immunotherapy. Pain in the right upper quadrant.

EXAM:
CT CHEST, ABDOMEN, AND PELVIS WITH CONTRAST
TECHNIQUE: Multidetector CT imaging of the chest, abdomen and pelvis was
performed following the standard protocol during bolus
administration of intravenous contrast.
CONTRAST:  100mL OMNIPAQUE IOHEXOL 350 MG/ML SOLN

[Series 2: axials cap 5.00 · axial · 0.71mm/px · z∈[-1541,-1001]mm · 9 of 133 slices shown, 11 images]
[im 13/133  mediastinal]
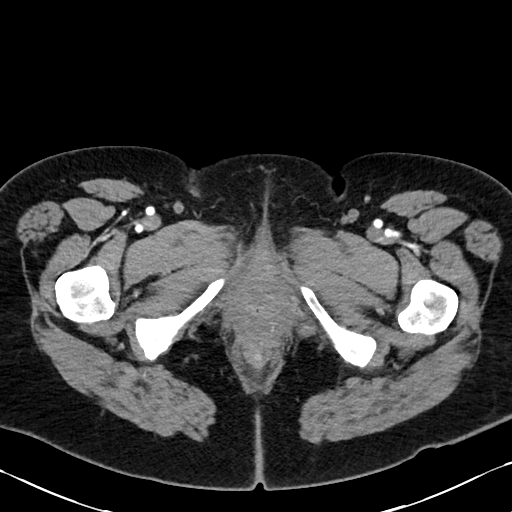
[im 13/133  bone]
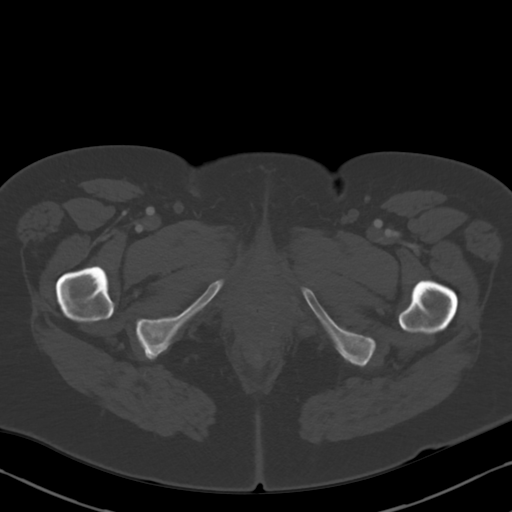
[im 25/133  mediastinal]
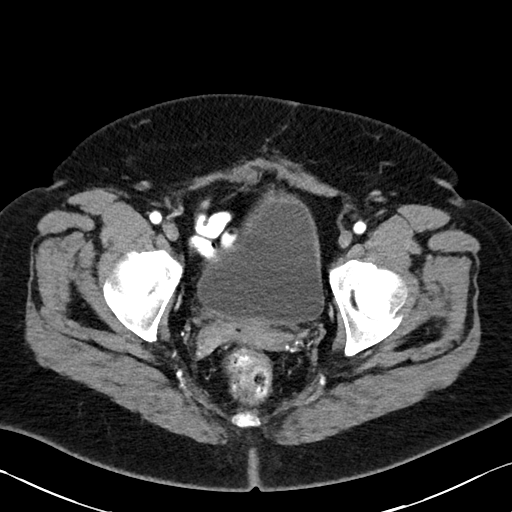
[im 37/133  mediastinal]
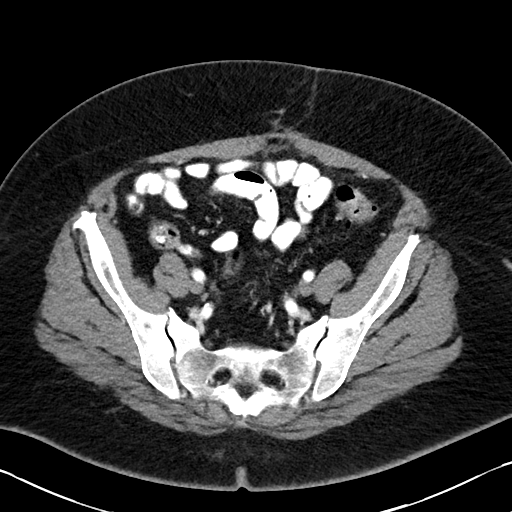
[im 49/133  mediastinal]
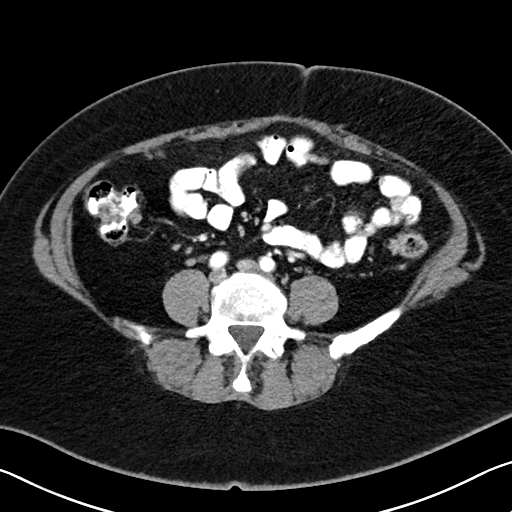
[im 73/133  mediastinal]
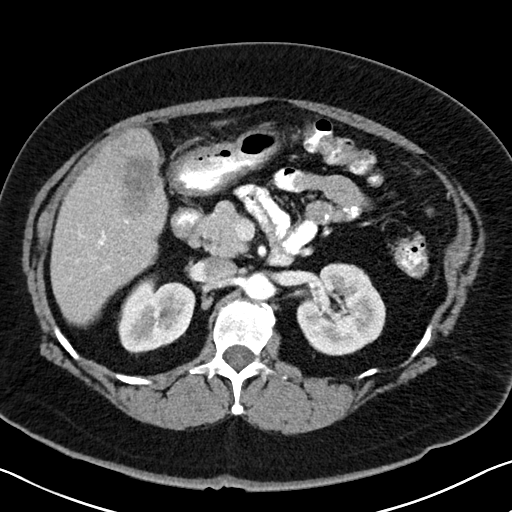
[im 85/133  mediastinal]
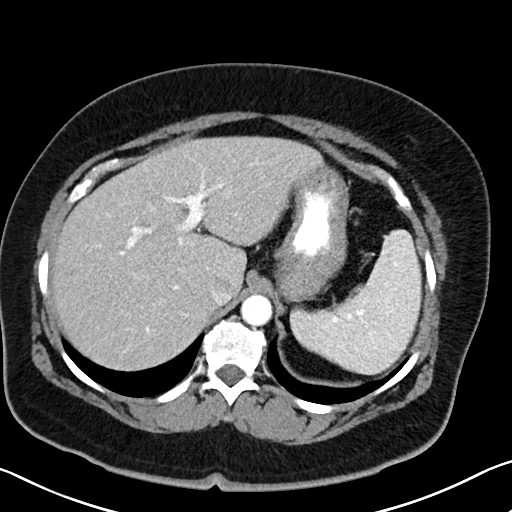
[im 97/133  mediastinal]
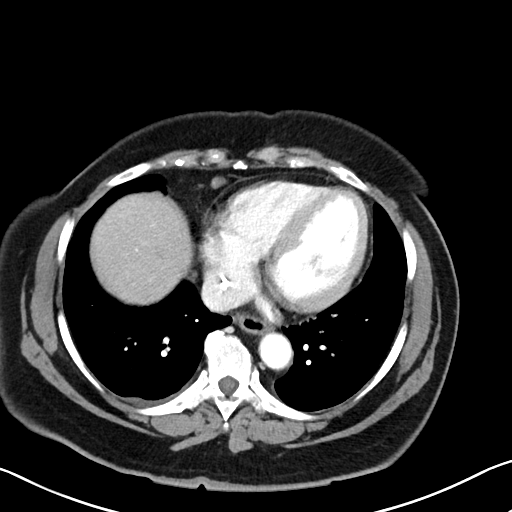
[im 109/133  mediastinal]
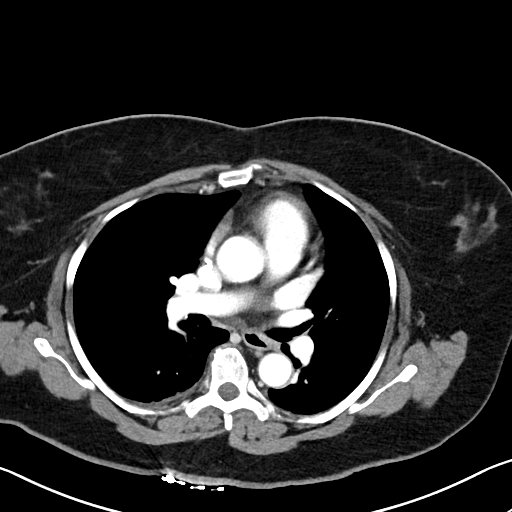
[im 121/133  mediastinal]
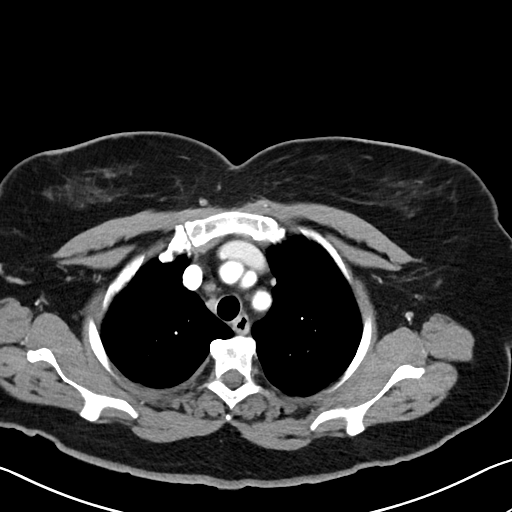
[im 121/133  bone]
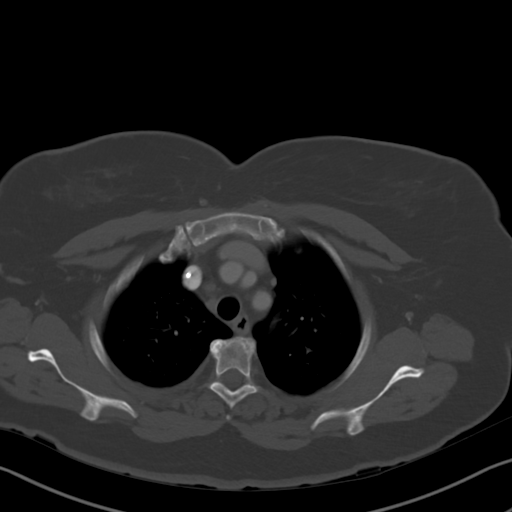

[Series 4: coronals cap 2.00 cor · coronal · 0.71mm/px · 3 of 161 slices shown]
[im 33/161  mediastinal]
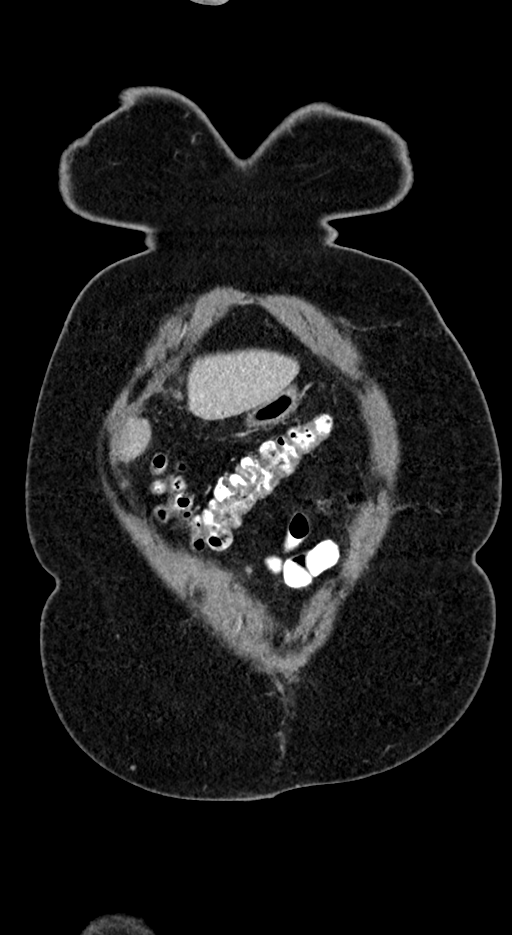
[im 65/161  mediastinal]
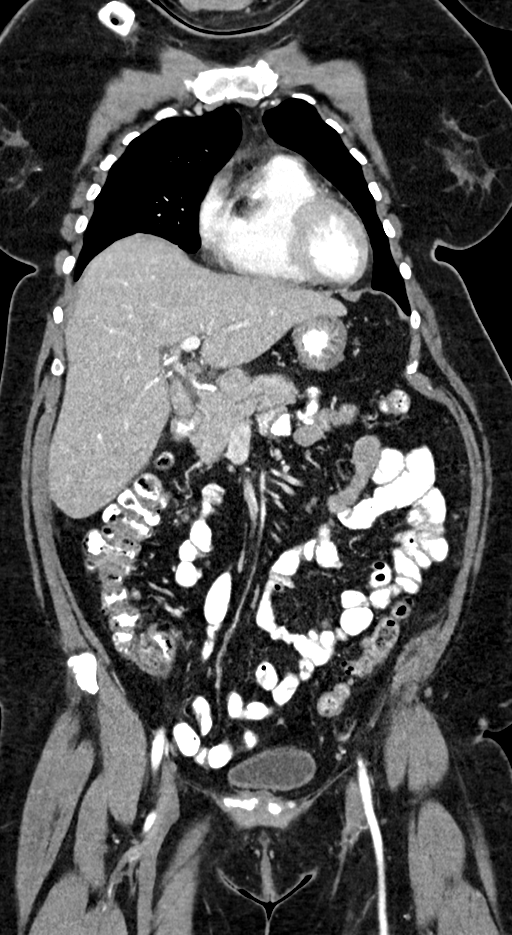
[im 97/161  mediastinal]
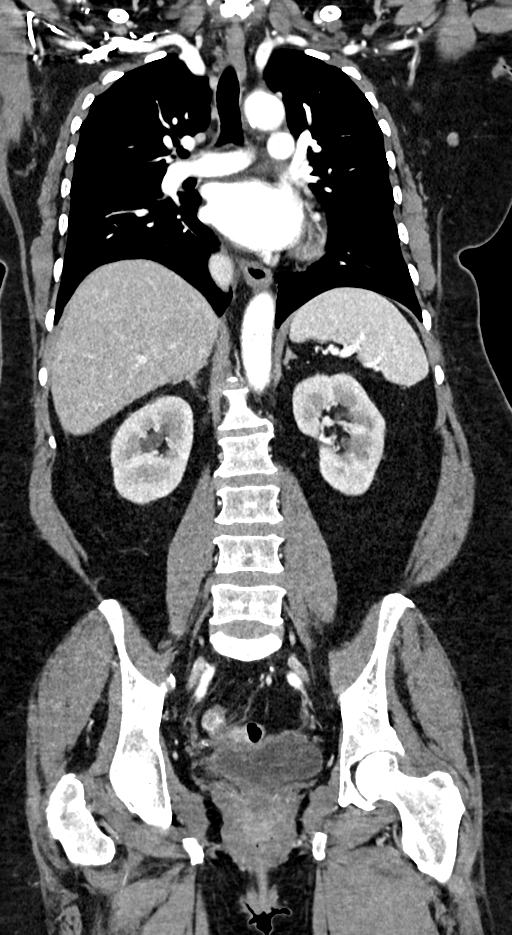

[12 of 36 positions shown; findings below may reference images not displayed]

FINDINGS: CT CHEST FINDINGS

Cardiovascular: Right Port-A-Cath tip: Upper IVC near the inferior
cavoatrial junction. Coronary, aortic arch, and branch vessel
atherosclerotic vascular disease. Mild cardiomegaly.

Mediastinum/Nodes: Right paratracheal node 0.9 cm in short axis on
image 18 series 2, previously 1.1 cm. Adjacent lower right
paratracheal lymph node measures 1.2 cm in short axis on image 20
series 2 (previously 1.3 cm) but has a fatty hilum.

Small type 1 hiatal hernia.

Lungs/Pleura: Trace right pleural effusion. Stable 4 by 3 mm
subpleural nodule in the right upper lobe along the minor fissure on
image 62 series 3, probably a subpleural lymph node. A separate 3 mm
right middle lobe subpleural nodule is present along the major
fissure on image 74 series 3, image crease conspicuity compared to
prior chest CT. A 0.6 by 0.4 cm right middle lobe subpleural nodule
on image 82 of series 3 is newly appreciable but may have been
previously obscured by prior atelectasis in the right middle lobe.

Musculoskeletal: Degenerative subcortical cyst or geode inferiorly
in the right glenoid. Thoracic spondylosis.

CT ABDOMEN PELVIS FINDINGS

Hepatobiliary: New enhancing right hepatic lobe masses along the
gallbladder fossa highly suspicious for malignancy. One of these
measures 4.6 by 2.5 cm on image 61 series 2. Another adjacent lesion
measures 2.0 by 1.4 cm on image 63 series 2.

Stable nonspecific hypodense lesion posteriorly in the right hepatic
lobe measuring 1.1 by 0.8 cm on image 72 series 2.

Newly appreciable nonspecific hypodense 0.7 by 0.5 cm lesion in the
dome of the right hepatic lobe on image 38 series 2.

Pancreas: Unremarkable

Spleen: Unremarkable

Adrenals/Urinary Tract: Both adrenal glands appear normal. 2.0 by
1.4 cm Bosniak category 1 cyst of the left kidney lower pole.

Stomach/Bowel: Scattered diverticula of the descending and proximal
sigmoid colon.

Vascular/Lymphatic: A porta hepatis/peripancreatic lymph node
measures 1.7 cm in short axis on image 54 series 2, formerly 0.8 cm
in short axis on [DATE]. Another porta hepatis lymph node
measures 1.8 cm in short axis on image 59 series 2, formerly 1.1 cm.

Mild atherosclerosis is present, including aortoiliac
atherosclerotic disease.

Reproductive: Uterus absent.  Adnexa unremarkable.

Other: Subtle nodularity along the omentum particularly in the left
upper quadrant for example on image 68 series 2, early peritoneal
spread of tumor not excluded. No current ascites.

Musculoskeletal: Small umbilical hernia contains adipose tissue.
Left paraumbilical small defect along the rectus abdominus on image
80 series 2, possibly postoperative.
IMPRESSION: 1. New masses in the right hepatic lobe adjacent to the gallbladder
fossa, compatible with recurrent malignancy. There is also a new
small hypodense lesion in the dome of the right hepatic lobe which
is technically nonspecific.
2. Scattered faint nodularity along the omentum is nonspecific but
could indicate early peritoneal spread of tumor. No overt ascites at
this time.
3. Stable posteroinferior right hepatic lobe lesion.
4. Increasing porta hepatis adenopathy. Borderline enlarged
paratracheal lymph nodes are slightly reduced in size from prior.
5. Small subpleural nodularity in the right middle lobe merits
surveillance.
6. Other imaging findings of potential clinical significance: Aortic
Atherosclerosis ([V0]-[V0]). Coronary atherosclerosis. Mild
cardiomegaly. Small type 1 hiatal hernia. Trace right pleural
effusion. Mild colonic diverticulosis. Small umbilical hernia and
small left paraumbilical defect in the rectus abdominus muscle.

## 2021-05-03 MED ORDER — IOHEXOL 350 MG/ML SOLN
100.0000 mL | Freq: Once | INTRAVENOUS | Status: AC | PRN
  Administered 2021-05-03: 100 mL via INTRAVENOUS

## 2021-05-04 ENCOUNTER — Inpatient Hospital Stay: Payer: Medicare Other | Attending: Internal Medicine

## 2021-05-04 ENCOUNTER — Inpatient Hospital Stay (HOSPITAL_BASED_OUTPATIENT_CLINIC_OR_DEPARTMENT_OTHER): Payer: Medicare Other | Admitting: Internal Medicine

## 2021-05-04 ENCOUNTER — Inpatient Hospital Stay: Payer: Medicare Other

## 2021-05-04 DIAGNOSIS — C23 Malignant neoplasm of gallbladder: Secondary | ICD-10-CM

## 2021-05-04 DIAGNOSIS — R180 Malignant ascites: Secondary | ICD-10-CM | POA: Insufficient documentation

## 2021-05-04 DIAGNOSIS — Z5111 Encounter for antineoplastic chemotherapy: Secondary | ICD-10-CM | POA: Insufficient documentation

## 2021-05-04 DIAGNOSIS — D6481 Anemia due to antineoplastic chemotherapy: Secondary | ICD-10-CM | POA: Insufficient documentation

## 2021-05-04 DIAGNOSIS — E876 Hypokalemia: Secondary | ICD-10-CM | POA: Diagnosis not present

## 2021-05-04 LAB — COMPREHENSIVE METABOLIC PANEL
ALT: 12 U/L (ref 0–44)
AST: 19 U/L (ref 15–41)
Albumin: 3.7 g/dL (ref 3.5–5.0)
Alkaline Phosphatase: 80 U/L (ref 38–126)
Anion gap: 7 (ref 5–15)
BUN: 14 mg/dL (ref 8–23)
CO2: 26 mmol/L (ref 22–32)
Calcium: 8.6 mg/dL — ABNORMAL LOW (ref 8.9–10.3)
Chloride: 104 mmol/L (ref 98–111)
Creatinine, Ser: 0.86 mg/dL (ref 0.44–1.00)
GFR, Estimated: >60 mL/min (ref >60)
Glucose, Bld: 152 mg/dL — ABNORMAL HIGH (ref 70–99)
Potassium: 3.8 mmol/L (ref 3.5–5.1)
Sodium: 137 mmol/L (ref 135–145)
Total Bilirubin: <0.1 mg/dL — ABNORMAL LOW (ref 0.3–1.2)
Total Protein: 6.9 g/dL (ref 6.5–8.1)

## 2021-05-04 LAB — CBC WITH DIFFERENTIAL/PLATELET
Abs Immature Granulocytes: 0.24 10*3/uL — ABNORMAL HIGH (ref 0.00–0.07)
Basophils Absolute: 0.1 10*3/uL (ref 0.0–0.1)
Basophils Relative: 1 %
Eosinophils Absolute: 0.1 10*3/uL (ref 0.0–0.5)
Eosinophils Relative: 1 %
HCT: 23.6 % — ABNORMAL LOW (ref 36.0–46.0)
Hemoglobin: 7.2 g/dL — ABNORMAL LOW (ref 12.0–15.0)
Immature Granulocytes: 3 %
Lymphocytes Relative: 27 %
Lymphs Abs: 2.5 10*3/uL (ref 0.7–4.0)
MCH: 27.8 pg (ref 26.0–34.0)
MCHC: 30.5 g/dL (ref 30.0–36.0)
MCV: 91.1 fL (ref 80.0–100.0)
Monocytes Absolute: 1.8 10*3/uL — ABNORMAL HIGH (ref 0.1–1.0)
Monocytes Relative: 19 %
Neutro Abs: 4.7 10*3/uL (ref 1.7–7.7)
Neutrophils Relative %: 49 %
Platelets: 103 10*3/uL — ABNORMAL LOW (ref 150–400)
RBC: 2.59 MIL/uL — ABNORMAL LOW (ref 3.87–5.11)
RDW: 23.6 % — ABNORMAL HIGH (ref 11.5–15.5)
WBC: 9.4 10*3/uL (ref 4.0–10.5)
nRBC: 0.3 % — ABNORMAL HIGH (ref 0.0–0.2)

## 2021-05-04 LAB — SAMPLE TO BLOOD BANK

## 2021-05-04 LAB — MAGNESIUM: Magnesium: 1.7 mg/dL (ref 1.7–2.4)

## 2021-05-04 MED ORDER — SODIUM CHLORIDE 0.9% FLUSH
10.0000 mL | Freq: Once | INTRAVENOUS | Status: AC
Start: 2021-05-04 — End: 2021-05-04
  Administered 2021-05-04: 10 mL via INTRAVENOUS
  Filled 2021-05-04: qty 10

## 2021-05-04 MED ORDER — TRAMADOL HCL 50 MG PO TABS: 50.0000 mg | ORAL_TABLET | Freq: Three times a day (TID) | ORAL | 0 refills | Status: AC | PRN

## 2021-05-04 MED ORDER — HEPARIN SOD (PORK) LOCK FLUSH 100 UNIT/ML IV SOLN
500.0000 [IU] | Freq: Once | INTRAVENOUS | Status: AC
  Administered 2021-05-04: 500 [IU] via INTRAVENOUS
  Filled 2021-05-04: qty 5

## 2021-05-04 NOTE — Assessment & Plan Note (Addendum)
#  Metastatic gallbladder adenocarcinomaCA 19-9- June 2022- 1700. Currentl;y on  palliative chemotherapy-gemcitabine and cisplatin  + Durvalumab.s/p  with cycle #6--CT scan Oct 11th, 2022-shows progressive disease-new masses in the right hepatic lobe/adjacent to gallbladder with recurrent malignancy.  Also new lesion in the right hepatic lobe concerning for metastasis.  Faint nodularity of omentum/peritoneal disease; periportal adenopathy increased.  Rising tumor marker.  #Hold chemotherapy-cisplatin gemcitabine plus Durvalumab-given the progressive disease.  Discussed regarding use of 5-FU based chemotherapy; with nab-iri every 2 weeks.  Patient understand treatments are palliative not curative.  Discussed the potential side effect of nausea vomiting and diarrhea.   # Anemia sec to chemo-hemoglobin today is 7.5 hold off PRBC transfusion.  Monitor closely. Monitor closely.   # Abdominal distention/abdominal pain- likely malignant ascites; s/p US/paracentsis-clinically stable.  No evidence of any progressive ascites noted on the CT scan.  # Blood glucose-s/p P-BF-BG-156 [HbA1c- May 5.5; continue glipizide 5 mg BID-stable  # HTN-  continue with cozaar-STABLE.   # Reflux- recommend PPI-once a day; in AM.   # Hypokalemia- 3.7- sec to cisplatin- improved; continue Kdur 20 meQ once a day- STABLE.   # Right upper quadrant pain-likely secondary malignancy.  Recommend tramadol prescription given.  Recommend monitoring for dizziness/drowsiness.  # DISPOSITION: # De-access today; HOLD chemo today # Follow up in 2 weeks- MD; labs- cbc/cmp/ca-19-9; FOLFIRI chemo- pump off 48 hours later-Dr.B  # I reviewed the blood work- with the patient in detail; also reviewed the imaging independently [as summarized above]; and with the patient in detail.    # 40 minutes face-to-face with the patient discussing the above plan of care; more than 50% of time spent on prognosis/ natural history; counseling and  coordination.

## 2021-05-04 NOTE — Progress Notes (Signed)
DISCONTINUE OFF PATHWAY REGIMEN - Other   [id number]:Cisplatin 25 mg/m2 D1,8 + Gemcitabine 1,000 mg/m2 D1,8 q21 Days:   A cycle is every 21 days:     Gemcitabine      Cisplatin   **Always confirm dose/schedule in your pharmacy ordering system**  REASON: Disease Progression PRIOR TREATMENT: Cisplatin 25 mg/m2 D1,8 + Gemcitabine 1,000 mg/m2 D1,8 q21 Days TREATMENT RESPONSE: Progressive Disease (PD)  START OFF PATHWAY REGIMEN - Other   [id number]:Fluorouracil 1,200 mg/m2/day CIV D1-2 + Leucovorin 400 mg/m2 IV D1 + Liposomal Irinotecan 70 mg/m2 IV D1 q14 Days:   A cycle is every 14 days:     Liposomal irinotecan      Leucovorin      Fluorouracil   **Always confirm dose/schedule in your pharmacy ordering system**  Patient Characteristics: Intent of Therapy: Non-Curative / Palliative Intent, Discussed with Patient

## 2021-05-04 NOTE — Progress Notes (Signed)
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

## 2021-05-04 NOTE — Progress Notes (Signed)
[facility] CONSULT NOTE  Patient Care Team: Revelo, Elyse Jarvis, MD as PCP - General (Family Medicine) Rico Junker, RN as Registered Nurse Christene Lye, MD (General Surgery) Clent Jacks, RN as Oncology Nurse Navigator  CHIEF COMPLAINTS/PURPOSE OF CONSULTATION: GALLBLADDER CANCER   #  Oncology History Overview Note  10/25- GALLBLADDER CANCER- [Dr.Cannon]/Dr.Byerly- laproscopic cholecystectomy- pT Category: pT3; pN1' POSITIVE for PNI; Histologic Type: Adenocarcinoma, biliary type  Histologic Grade: G3, poorly differentiated  Tumor Size: Greatest dimension: 2.2 cm  Tumor Extent: Tumor directly invades the liver  Lymphovascular Invasion: Present pre-treatement   Tumor Site: Fundus MARGINS  Margin Status for Invasive Carcinoma:      Margin Involved by Invasive Carcinoma: Liver parenchymal / hepatic  Bed; Number of Lymph Nodes with Tumor: 1       Number of Lymph Nodes Examined: 1    # October 25th 2021- Incidental gallbladder ADENOCA-s/p acute cholecystectomy [Dr.Cannon]. Stage III- T3N1; liver margin positive.  CT chest negative for any metastatic disease.  MRI abdomen with and without contrast-residual disease noted gallbladder bed; no evidence of distant metastases or lymphadenopathy. NOv 3rd 2021-CA 19-9 > 2000;     # NEOADJUVANT CHEMO-12/15-  Gem-cis [d1 & d-8 q 21 days] ; DEC 2021- [id number].   June 14th, 2022- Dr.Byerly- METASTATIC DISEASE [. Multiple firm nodules were identified adherent to the anterior abdominal wall in both the RUQ as well as periumbilical area. These were primarily in the omentum, though one was between the liver and the abdominal wall.  Several of these nodules were excised and sent to pathology for frozen section, with preliminary pathology results consistent with adenocarcinoma]  # July 6th- Gem-Cis- Durva q21 days  9/21- cycle6day#1-decrease dose of gemcitabine to 80 mg meter square; cisplatin to 20 mg of  metered square; same dose of Durva; day 8-no changes; on pro.   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 Kristina Orozco [place] 65 y.o.  female stage IV adenocarcinoma gallbladder is currently on palliative chemotherapy-gemcitabine cisplatin-Durvalumab-is here for follow-up to review the results of the CT scan.  Patient complains of mild increasing right upper quadrant pain.  Tylenol not helping.  No headaches.  No nausea no vomiting.  Complains of fatigue.  No chest pain.  No cough.  Review of Systems  Constitutional:  Positive for malaise/fatigue. Negative for chills, diaphoresis and fever.  HENT:  Negative for nosebleeds and sore throat.   Eyes:  Negative for double vision.  Respiratory:  Negative for cough, hemoptysis, sputum production, shortness of breath and wheezing.   Cardiovascular:  Negative for chest pain, palpitations, orthopnea and leg swelling.  Gastrointestinal:  Positive for abdominal pain. Negative for blood  in stool, diarrhea, heartburn, melena, nausea and vomiting.  Genitourinary:  Negative for dysuria, frequency and urgency.  Musculoskeletal:  Negative for back pain and joint pain.  Skin: Negative.  Negative for itching  and rash.  Neurological:  Negative for dizziness, tingling, focal weakness, weakness and headaches.  Endo/Heme/Allergies:  Does not bruise/bleed easily.  Psychiatric/Behavioral:  Negative for depression. The patient is not nervous/anxious and does not have insomnia.  +   MEDICAL HISTORY:  Past Medical History:  Diagnosis Date  . Cancer ([place]) 05/17/2020   gallbladder  . Diabetes mellitus without complication ([place])   . History of chemotherapy    started 06/2020  . Hypertension     SURGICAL HISTORY: Past Surgical History:  Procedure Laterality Date  . ABDOMINAL HYSTERECTOMY    . APPENDECTOMY  2004  . BREAST BIOPSY Left 2011  . BREAST BIOPSY Right 08/26/13  . 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/28/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  . CHOLECYSTECTOMY N/A 05/17/2020   Procedure: LAPAROSCOPIC CHOLECYSTECTOMY;  Surgeon: Fredirick Maudlin, MD;  Location: [facility] ORS;  Service: General;  Laterality: N/A;  . COLONOSCOPY WITH PROPOFOL N/A 01/31/2018   Procedure: COLONOSCOPY WITH PROPOFOL;  Surgeon: Jonathon Bellows, MD;  Location[facility] ENDOSCOPY;  Service: Gastroenterology;  Laterality: N/A;  . LAPAROSCOPIC LYSIS OF ADHESIONS  01/04/2021   Procedure: LAPAROSCOPIC LYSIS OF ADHESIONS;  Surgeon: Stark Klein, MD;  Location: [facility];  Service: General;;  1 HOUR  . LAPAROSCOPY N/A 01/04/2021   Procedure: LAPAROSCOPY DIAGNOSTIC;  Surgeon: Stark Klein, MD;  Location: [facility];  Service: General;  Laterality: N/A;  EPIDURAL  . PORTACATH PLACEMENT Right 07/02/2020   Procedure: INSERTION PORT-A-CATH, chemotherapy;  Surgeon: Fredirick Maudlin, MD;  Location: [facility] ORS;  Service: General;  Laterality: Right;    SOCIAL HISTORY: Social History   Socioeconomic History  . Marital status: Single    Spouse name: Not on file  . Number of children: Not on file  . Years of education: Not on file  . Highest education level: Not on file  Occupational History  . Occupation: special needs worker  Tobacco Use  . Smoking status: Never  . Smokeless  tobacco: Never  Vaping Use  . Vaping Use: Never used  Substance and Sexual Activity  . Alcohol use: Yes    Alcohol/week: 2.0 standard drinks    Types: 2 Cans of beer per week    Comment: beer on weekend  . Drug use: No  . Sexual activity: Not Currently  Other Topics Concern  . Not on file  Social History Narrative   Lives in [place]; with son. 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 Never smoked; beer/iqour over weekends.    Social Determinants of Health   Financial Resource Strain: Not on file  Food Insecurity: Not on file  Transportation Needs: Not on file  Physical Activity: Not on file  Stress: Not on file  Social Connections: Not on file  Intimate Partner Violence: Not on file    FAMILY HISTORY: Family History  Problem Relation Age of Onset  . Pancreatic cancer Mother        AT 49 years  . Breast cancer Sister        appx-55 years  . Pancreatic cancer Maternal Grandmother     ALLERGIES:  is allergic to seasonal ic [cholestatin].  MEDICATIONS:  Current Outpatient Medications  Medication Sig Dispense Refill  . Chlorpheniramine Maleate (ALLERGY RELIEF PO) Take 1 tablet by mouth daily as needed (allergies).    . CINNAMON PO Take 1 tablet  by mouth daily.    Marland Kitchen GARLIC PO Take 1 tablet by mouth daily.    Marland Kitchen glipiZIDE (GLUCOTROL) 5 MG tablet Take 1 tablet (5 mg total) by mouth 2 (two) times daily before a meal. 60 tablet 3  . lidocaine-prilocaine (EMLA) cream Apply 1 application topically as needed. Apply on the port 30-45 mins prior to port access 30 g 1  . losartan (COZAAR) 25 MG tablet Take 25 mg by mouth every morning.     . Multiple Vitamins-Minerals (MULTIVITAMIN WITH MINERALS) tablet Take 1 tablet by mouth daily.    . potassium chloride SA (KLOR-CON) 20 MEQ tablet 1 pill twice a day 60 tablet 3  . pravastatin (PRAVACHOL) 10 MG tablet Take 10 mg by mouth 4 (four) times a week.    . traMADol (ULTRAM) 50 MG tablet Take 1 tablet (50 mg total) by  mouth every 8 (eight) hours as needed. Watch out dizziness after taking the pills. Caution with driving 45 tablet 0  . ondansetron (ZOFRAN) 8 MG tablet One pill every 8 hours as needed for nausea/vomitting. (Patient not taking: No sig reported) 40 tablet 1  . oxyCODONE (OXY IR/ROXICODONE) 5 MG immediate release tablet Take 1-2 tablets (5-10 mg total) by mouth every 6 (six) hours as needed for severe pain. (Patient not taking: No sig reported) 60 tablet 0  . prochlorperazine (COMPAZINE) 10 MG tablet Take 1 tablet (10 mg total) by mouth every 6 (six) hours as needed for nausea or vomiting. (Patient not taking: No sig reported) 40 tablet 1   No current facility-administered medications for this visit.   Facility-Administered Medications Ordered in Other Visits  Medication Dose Route Frequency Provider Last Rate Last Admin  . heparin lock flush 100 UNIT/ML injection           . heparin lock flush 100 unit/mL  500 Units Intravenous Once Charlaine Dalton R, MD      . sodium chloride flush (NS) 0.9 % injection 10 mL  10 mL Intravenous PRN Cammie Sickle, MD   10 mL at 04/20/21 0825      .  PHYSICAL EXAMINATION: ECOG PERFORMANCE STATUS: 0 - Asymptomatic  Vitals:   05/04/21 0848  BP: (!) 146/71  Pulse: 98  Resp: 18  Temp: 98.2 F (36.8 C)  SpO2: 100%   Filed Weights   05/04/21 0844  Weight: 213 lb (96.6 kg)    Physical Exam HENT:     Head: Normocephalic and atraumatic.     Mouth/Throat:     Pharynx: No oropharyngeal exudate.  Eyes:     Pupils: Pupils are equal, round, and reactive to light.  Cardiovascular:     Rate and Rhythm: Normal rate and regular rhythm.  Pulmonary:     Effort: Pulmonary effort is normal. No respiratory distress.     Breath sounds: Normal breath sounds. No wheezing.  Abdominal:     General: Bowel sounds are normal. There is distension.     Palpations: Abdomen is soft. There is no mass.     Tenderness: There is no abdominal tenderness. There is  no guarding or rebound.  Musculoskeletal:        General: No tenderness. Normal range of motion.     Cervical back: Normal range of motion and neck supple.  Skin:    General: Skin is warm.  Neurological:     Mental Status: She is alert and oriented to person, place, and time.  Psychiatric:        Mood  and Affect: Affect normal.     LABORATORY DATA:  I have reviewed the data as listed Lab Results  Component Value Date   WBC 9.4 05/04/2021   HGB 7.2 (L) 05/04/2021   HCT 23.6 (L) 05/04/2021   MCV 91.1 05/04/2021   PLT 103 (L) 05/04/2021   Recent Labs    04/13/21 0810 04/20/21 0810 05/04/21 0820  NA 138 135 137  K 3.7 4.3 3.8  CL 103 102 104  CO2 _0 GLUCOSE 152* 135* 152*  BUN 19 25* 14  CREATININE 0.92 0.97 0.86  CALCIUM 9.1 8.9 8.6*  GFRNONAA >60 >60 >60  PROT 7.2 7.6 6.9  ALBUMIN 4.0 4.2 3.7  AST _1 ALT _2 ALKPHOS 52 57 80  BILITOT 0.5 0.5 <0.1*    RADIOGRAPHIC STUDIES: I have personally reviewed the radiological images as listed and agreed with the findings in the report. CT CHEST ABDOMEN PELVIS W CONTRAST  Result Date: 05/04/2021 CLINICAL DATA:  Restaging gallbladder cancer. Current chemotherapy and immunotherapy. Pain in the right upper quadrant. EXAM: CT CHEST, ABDOMEN, AND PELVIS WITH CONTRAST TECHNIQUE: Multidetector CT imaging of the chest, abdomen and pelvis was performed following the standard protocol during bolus administration of intravenous contrast. CONTRAST:  148m OMNIPAQUE IOHEXOL 350 MG/ML SOLN COMPARISON:  Multiple exams, including abdominal MR from 11/03/2020 and chest CT from 05/31/2020 FINDINGS: CT CHEST FINDINGS Cardiovascular: Right Port-A-Cath tip: Upper IVC near the inferior cavoatrial junction. Coronary, aortic arch, and branch vessel atherosclerotic vascular disease. Mild cardiomegaly. Mediastinum/Nodes: Right paratracheal node 0.9 cm in short axis on image 18 series 2, previously 1.1 cm. Adjacent lower right  paratracheal lymph node measures 1.2 cm in short axis on image 20 series 2 (previously 1.3 cm) but has a fatty hilum. Small type 1 hiatal hernia. Lungs/Pleura: Trace right pleural effusion. Stable 4 by 3 mm subpleural nodule in the right upper lobe along the minor fissure on image 62 series 3, probably a subpleural lymph node. A separate 3 mm right middle lobe subpleural nodule is present along the major fissure on image 74 series 3, image crease conspicuity compared to prior chest CT. A 0.6 by 0.4 cm right middle lobe subpleural nodule on image 82 of series 3 is newly appreciable but may have been previously obscured by prior atelectasis in the right middle lobe. Musculoskeletal: Degenerative subcortical cyst or geode inferiorly in the right glenoid. Thoracic spondylosis. CT ABDOMEN PELVIS FINDINGS Hepatobiliary: New enhancing right hepatic lobe masses along the gallbladder fossa highly suspicious for malignancy. One of these measures 4.6 by 2.5 cm on image 61 series 2. Another adjacent lesion measures 2.0 by 1.4 cm on image 63 series 2. Stable nonspecific hypodense lesion posteriorly in the right hepatic lobe measuring 1.1 by 0.8 cm on image 72 series 2. Newly appreciable nonspecific hypodense 0.7 by 0.5 cm lesion in the dome of the right hepatic lobe on image 38 series 2. Pancreas: Unremarkable Spleen: Unremarkable Adrenals/Urinary Tract: Both adrenal glands appear normal. 2.0 by 1.4 cm Bosniak category 1 cyst of the left kidney lower pole. Stomach/Bowel: Scattered diverticula of the descending and proximal sigmoid colon. Vascular/Lymphatic: A porta hepatis/peripancreatic lymph node measures 1.7 cm in short axis on image 54 series 2, formerly 0.8 cm in short axis on 11/03/2020. Another porta hepatis lymph node measures 1.8 cm in short axis on image 59 series 2, formerly 1.1 cm. Mild atherosclerosis is present, including aortoiliac atherosclerotic disease. Reproductive: Uterus absent.  Adnexa unremarkable. Other:  Subtle nodularity along the omentum particularly in the left upper quadrant for example on image 68 series 2, early peritoneal spread of tumor not excluded. No current ascites. Musculoskeletal: Small umbilical hernia contains adipose tissue. Left paraumbilical small defect along the rectus abdominus on image 80 series 2, possibly postoperative. IMPRESSION: 1. New masses in the right hepatic lobe adjacent to the gallbladder fossa, compatible with recurrent malignancy. There is also a new small hypodense lesion in the dome of the right hepatic lobe which is technically nonspecific. 2. Scattered faint nodularity along the omentum is nonspecific but could indicate early peritoneal spread of tumor. No overt ascites at this time. 3. Stable posteroinferior right hepatic lobe lesion. 4. Increasing porta hepatis adenopathy. Borderline enlarged paratracheal lymph nodes are slightly reduced in size from prior. 5. Small subpleural nodularity in the right middle lobe merits surveillance. 6. Other imaging findings of potential clinical significance: Aortic Atherosclerosis (ICD10-I70.0). Coronary atherosclerosis. Mild cardiomegaly. Small type 1 hiatal hernia. Trace right pleural effusion. Mild colonic diverticulosis. Small umbilical hernia and small left paraumbilical defect in the rectus abdominus muscle. Electronically Signed   By: Van Clines M.D.   On: 05/04/2021 07:59     ASSESSMENT & PLAN:   Primary adenocarcinoma of gall bladder [facility]) #Metastatic gallbladder adenocarcinomaCA 19-9- June 2022- 1700. Currentl;y on  palliative chemotherapy-gemcitabine and cisplatin  + Durvalumab.s/p  with cycle #6--CT scan Oct 11th, 2022-shows progressive disease-new masses in the right hepatic lobe/adjacent to gallbladder with recurrent malignancy.  Also new lesion in the right hepatic lobe concerning for metastasis.  Faint nodularity of omentum/peritoneal disease; periportal adenopathy increased.  Rising tumor marker.  #Hold  chemotherapy-cisplatin gemcitabine plus Durvalumab-given the progressive disease.  Discussed regarding use of 5-FU based chemotherapy; with nab-iri every 2 weeks.  Patient understand treatments are palliative not curative.  Discussed the potential side effect of nausea vomiting and diarrhea.   # Anemia sec to chemo-hemoglobin today is 7.5 hold off PRBC transfusion.  Monitor closely. Monitor closely.   # Abdominal distention/abdominal pain- likely malignant ascites; s/p US/paracentsis-clinically stable.  No evidence of any progressive ascites noted on the CT scan.  # Blood glucose-s/p P-BF-BG-156 [HbA1c- May 5.5; continue glipizide 5 mg BID-stable  # HTN-  continue with cozaar-STABLE.   # Reflux- recommend PPI-once a day; in AM.   # Hypokalemia- 3.7- sec to cisplatin- improved; continue Kdur 20 meQ once a day- STABLE.   # Right upper quadrant pain-likely secondary malignancy.  Recommend tramadol prescription given.  Recommend monitoring for dizziness/drowsiness.  # DISPOSITION: # De-access today; HOLD chemo today # Follow up in 2 weeks- MD; labs- cbc/cmp/ca-19-9; FOLFIRI chemo- pump off 48 hours later-Dr.B  # I reviewed the blood work- with the patient in detail; also reviewed the imaging independently [as summarized above]; and with the patient in detail.    # 40 minutes face-to-face with the patient discussing the above plan of care; more than 50% of time spent on prognosis/ natural history; counseling and coordination.   All questions were answered. The patient knows to call the clinic with any problems, questions or concerns.   Cammie Sickle, MD 05/04/2021 10:42 PM

## 2021-05-05 LAB — CANCER ANTIGEN 19-9: CA 19-9: 3046 U/mL — ABNORMAL HIGH (ref 0–35)

## 2021-05-10 ENCOUNTER — Encounter: Payer: Self-pay | Admitting: General Surgery

## 2021-05-11 ENCOUNTER — Other Ambulatory Visit: Payer: Medicare Other

## 2021-05-11 ENCOUNTER — Ambulatory Visit: Payer: Medicare Other

## 2021-05-11 NOTE — Progress Notes (Signed)
Pharmacist Chemotherapy Monitoring - Initial Assessment    Anticipated start date: 05/18/21   The following has been reviewed per standard work regarding the patient's treatment regimen: The patient's diagnosis, treatment plan and drug doses, and organ/hematologic function Lab orders and baseline tests specific to treatment regimen  The treatment plan start date, drug sequencing, and pre-medications Prior authorization status  Patient's documented medication list, including drug-drug interaction screen and prescriptions for anti-emetics and supportive care specific to the treatment regimen The drug concentrations, fluid compatibility, administration routes, and timing of the medications to be used The patient's access for treatment and lifetime cumulative dose history, if applicable  The patient's medication allergies and previous infusion related reactions, if applicable   Changes made to treatment plan:  treatment plan date  Follow up needed:  signing treatment plan   Adelina Mings, [facility], 05/11/2021  12:03 PM

## 2021-05-18 ENCOUNTER — Other Ambulatory Visit: Payer: Self-pay | Admitting: *Deleted

## 2021-05-18 ENCOUNTER — Inpatient Hospital Stay (HOSPITAL_BASED_OUTPATIENT_CLINIC_OR_DEPARTMENT_OTHER): Payer: Medicare Other | Admitting: Internal Medicine

## 2021-05-18 ENCOUNTER — Inpatient Hospital Stay: Payer: Medicare Other

## 2021-05-18 ENCOUNTER — Other Ambulatory Visit: Payer: Self-pay

## 2021-05-18 ENCOUNTER — Encounter: Payer: Self-pay | Admitting: Internal Medicine

## 2021-05-18 DIAGNOSIS — Z5111 Encounter for antineoplastic chemotherapy: Secondary | ICD-10-CM | POA: Diagnosis not present

## 2021-05-18 DIAGNOSIS — C23 Malignant neoplasm of gallbladder: Secondary | ICD-10-CM

## 2021-05-18 LAB — COMPREHENSIVE METABOLIC PANEL
ALT: 16 U/L (ref 0–44)
AST: 23 U/L (ref 15–41)
Albumin: 4 g/dL (ref 3.5–5.0)
Alkaline Phosphatase: 79 U/L (ref 38–126)
Anion gap: 9 (ref 5–15)
BUN: 17 mg/dL (ref 8–23)
CO2: 25 mmol/L (ref 22–32)
Calcium: 8.9 mg/dL (ref 8.9–10.3)
Chloride: 104 mmol/L (ref 98–111)
Creatinine, Ser: 0.93 mg/dL (ref 0.44–1.00)
GFR, Estimated: >60 mL/min (ref >60)
Glucose, Bld: 98 mg/dL (ref 70–99)
Potassium: 4 mmol/L (ref 3.5–5.1)
Sodium: 138 mmol/L (ref 135–145)
Total Bilirubin: 0.5 mg/dL (ref 0.3–1.2)
Total Protein: 7.5 g/dL (ref 6.5–8.1)

## 2021-05-18 LAB — CBC WITH DIFFERENTIAL/PLATELET
Abs Immature Granulocytes: 0.06 10*3/uL (ref 0.00–0.07)
Basophils Absolute: 0 10*3/uL (ref 0.0–0.1)
Basophils Relative: 0 %
Eosinophils Absolute: 0.1 10*3/uL (ref 0.0–0.5)
Eosinophils Relative: 2 %
HCT: 27.4 % — ABNORMAL LOW (ref 36.0–46.0)
Hemoglobin: 8.4 g/dL — ABNORMAL LOW (ref 12.0–15.0)
Immature Granulocytes: 1 %
Lymphocytes Relative: 18 %
Lymphs Abs: 1.6 10*3/uL (ref 0.7–4.0)
MCH: 28.4 pg (ref 26.0–34.0)
MCHC: 30.7 g/dL (ref 30.0–36.0)
MCV: 92.6 fL (ref 80.0–100.0)
Monocytes Absolute: 1.6 10*3/uL — ABNORMAL HIGH (ref 0.1–1.0)
Monocytes Relative: 17 %
Neutro Abs: 5.8 10*3/uL (ref 1.7–7.7)
Neutrophils Relative %: 62 %
Platelets: 314 10*3/uL (ref 150–400)
RBC: 2.96 MIL/uL — ABNORMAL LOW (ref 3.87–5.11)
RDW: 20 % — ABNORMAL HIGH (ref 11.5–15.5)
WBC: 9.2 10*3/uL (ref 4.0–10.5)
nRBC: 0 % (ref 0.0–0.2)

## 2021-05-18 LAB — MAGNESIUM: Magnesium: 2 mg/dL (ref 1.7–2.4)

## 2021-05-18 MED ORDER — PALONOSETRON HCL INJECTION 0.25 MG/5ML
0.2500 mg | Freq: Once | INTRAVENOUS | Status: AC
  Administered 2021-05-18: 0.25 mg via INTRAVENOUS
  Filled 2021-05-18: qty 5

## 2021-05-18 MED ORDER — DIPHENOXYLATE-ATROPINE 2.5-0.025 MG PO TABS
1.0000 | ORAL_TABLET | Freq: Once | ORAL | Status: AC
Start: 2021-05-18 — End: 2021-05-18
  Administered 2021-05-18: 1 via ORAL
  Filled 2021-05-18: qty 1

## 2021-05-18 MED ORDER — SODIUM CHLORIDE 0.9 % IV SOLN
5000.0000 mg | INTRAVENOUS | Status: DC
  Administered 2021-05-18: 5000 mg via INTRAVENOUS
  Filled 2021-05-18: qty 100

## 2021-05-18 MED ORDER — SODIUM CHLORIDE 0.9 % IV SOLN
850.0000 mg | Freq: Once | INTRAVENOUS | Status: AC
  Administered 2021-05-18: 850 mg via INTRAVENOUS
  Filled 2021-05-18: qty 42.5

## 2021-05-18 MED ORDER — SODIUM CHLORIDE 0.9 % IV SOLN
Freq: Once | INTRAVENOUS | Status: AC
  Filled 2021-05-18: qty 250

## 2021-05-18 MED ORDER — SODIUM CHLORIDE 0.9% FLUSH
10.0000 mL | Freq: Once | INTRAVENOUS | Status: AC
  Administered 2021-05-18: 10 mL via INTRAVENOUS
  Filled 2021-05-18: qty 10

## 2021-05-18 MED ORDER — SODIUM CHLORIDE 0.9 % IV SOLN
150.0000 mg | Freq: Once | INTRAVENOUS | Status: AC
  Administered 2021-05-18: 150 mg via INTRAVENOUS
  Filled 2021-05-18: qty 34.88

## 2021-05-18 MED ORDER — DEXAMETHASONE SODIUM PHOSPHATE 100 MG/10ML IJ SOLN
10.0000 mg | Freq: Once | INTRAMUSCULAR | Status: AC
  Administered 2021-05-18: 10 mg via INTRAVENOUS
  Filled 2021-05-18: qty 1

## 2021-05-18 MED ORDER — DIPHENOXYLATE-ATROPINE 2.5-0.025 MG PO TABS: 1.0000 | ORAL_TABLET | Freq: Four times a day (QID) | ORAL | 0 refills | Status: AC | PRN

## 2021-05-18 NOTE — Progress Notes (Signed)
[name] CONSULT NOTE  Patient Care Team: Revelo, Elyse Jarvis, MD as PCP - General (Family Medicine) Rico Junker, RN as Registered Nurse Christene Lye, MD (General Surgery) Clent Jacks, RN as Oncology Nurse Navigator  CHIEF COMPLAINTS/PURPOSE OF CONSULTATION: GALLBLADDER CANCER   #  Oncology History Overview Note  10/25- GALLBLADDER CANCER- [Dr.Cannon]/Dr.Byerly- laproscopic cholecystectomy- pT Category: pT3; pN1' POSITIVE for PNI; Histologic Type: Adenocarcinoma, biliary type  Histologic Grade: G3, poorly differentiated  Tumor Size: Greatest dimension: 2.2 cm  Tumor Extent: Tumor directly invades the liver  Lymphovascular Invasion: Present pre-treatement   Tumor Site: Fundus MARGINS  Margin Status for Invasive Carcinoma:      Margin Involved by Invasive Carcinoma: Liver parenchymal / hepatic  Bed; Number of Lymph Nodes with Tumor: 1       Number of Lymph Nodes Examined: 1    # October 25th 2021- Incidental gallbladder ADENOCA-s/p acute cholecystectomy [Dr.Cannon]. Stage III- T3N1; liver margin positive.  CT chest negative for any metastatic disease.  MRI abdomen with and without contrast-residual disease noted gallbladder bed; no evidence of distant metastases or lymphadenopathy. NOv 3rd 2021-CA 19-9 > 2000;     # NEOADJUVANT CHEMO-12/15-  Gem-cis [d1 & d-8 q 21 days] ; DEC 2021- [id number].   June 14th, 2022- Dr.Byerly- METASTATIC DISEASE [. Multiple firm nodules were identified adherent to the anterior abdominal wall in both the RUQ as well as periumbilical area. These were primarily in the omentum, though one was between the liver and the abdominal wall.  Several of these nodules were excised and sent to pathology for frozen section, with preliminary pathology results consistent with adenocarcinoma]  # July 6th- Gem-Cis- Durva q21 days  9/21- cycle6day#1-decrease dose of gemcitabine to 80 mg meter square; cisplatin to 20 mg of  metered square; same dose of Durva; day 8-no changes; on pro.   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 Kristina Orozco 65 y.o.  female stage IV adenocarcinoma gallbladder is most recently on palliative chemotherapy-gemcitabine cisplatin-Durvalumab; with progression on imaging/tumor marker is here to proceed with 5FU+Onivyde.  Patient continues to have right upper quadrant abdominal pain.  Patient is taking tramadol once at nighttime.  Does not want to take it in the morning because of work.  Complains of mild fatigue.  No chest pain.  No cough.  Review of Systems  Constitutional:  Positive for malaise/fatigue. Negative for chills, diaphoresis and fever.  HENT:  Negative for nosebleeds and sore throat.   Eyes:  Negative for double vision.  Respiratory:  Negative for cough, hemoptysis, sputum production, shortness of breath and wheezing.   Cardiovascular:  Negative for chest pain, palpitations, orthopnea and  leg swelling.  Gastrointestinal:  Positive for abdominal pain. Negative for blood in stool, diarrhea, heartburn, melena, nausea and vomiting.  Genitourinary:  Negative for dysuria, frequency and urgency.  Musculoskeletal:  Negative for  back pain and joint pain.  Skin: Negative.  Negative for itching and rash.  Neurological:  Negative for dizziness, tingling, focal weakness, weakness and headaches.  Endo/Heme/Allergies:  Does not bruise/bleed easily.  Psychiatric/Behavioral:  Negative for depression. The patient is not nervous/anxious and does not have insomnia.  +   MEDICAL HISTORY:  Past Medical History:  Diagnosis Date  . Cancer ([place]) 05/17/2020   gallbladder  . Diabetes mellitus without complication ([place])   . History of chemotherapy    started 06/2020  . Hypertension     SURGICAL HISTORY: Past Surgical History:  Procedure Laterality Date  . ABDOMINAL HYSTERECTOMY    . APPENDECTOMY  2004  . BREAST BIOPSY Left 2011  . BREAST BIOPSY Right 08/26/13  . 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/28/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  . CHOLECYSTECTOMY N/A 05/17/2020   Procedure: LAPAROSCOPIC CHOLECYSTECTOMY;  Surgeon: Fredirick Maudlin, MD;  Location: [facility] ORS;  Service: General;  Laterality: N/A;  . COLONOSCOPY WITH PROPOFOL N/A 01/31/2018   Procedure: COLONOSCOPY WITH PROPOFOL;  Surgeon: Jonathon Bellows, MD;  Location: [facility] ENDOSCOPY;  Service: Gastroenterology;  Laterality: N/A;  . LAPAROSCOPIC LYSIS OF ADHESIONS  01/04/2021   Procedure: LAPAROSCOPIC LYSIS OF ADHESIONS;  Surgeon: Stark Klein, MD;  Location: [facility];  Service: General;;  1 HOUR  . LAPAROSCOPY N/A 01/04/2021   Procedure: LAPAROSCOPY DIAGNOSTIC;  Surgeon: Stark Klein, MD;  Location: [facility];  Service: General;  Laterality: N/A;  EPIDURAL  . PORTACATH PLACEMENT Right 07/02/2020   Procedure: INSERTION PORT-A-CATH, chemotherapy;  Surgeon: Fredirick Maudlin, MD;  Location: [facility] ORS;  Service: General;  Laterality: Right;    SOCIAL HISTORY: Social History   Socioeconomic History  . Marital status: Single    Spouse name: Not on file  . Number of children: Not on file  . Years of education: Not on file  . Highest education level: Not on file  Occupational History  . Occupation: special  needs worker  Tobacco Use  . Smoking status: Never  . Smokeless tobacco: Never  Vaping Use  . Vaping Use: Never used  Substance and Sexual Activity  . Alcohol use: Yes    Alcohol/week: 2.0 standard drinks    Types: 2 Cans of beer per week    Comment: beer on weekend  . Drug use: No  . Sexual activity: Not Currently  Other Topics Concern  . Not on file  Social History Narrative   Lives in [place]; with son. 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 Never smoked; beer/iqour over weekends.    Social Determinants of Health   Financial Resource Strain: Not on file  Food Insecurity: Not on file  Transportation Needs: Not on file  Physical Activity: Not on file  Stress: Not on file  Social Connections: Not on file  Intimate Partner Violence: Not on file    FAMILY HISTORY: Family History  Problem Relation Age of Onset  . Pancreatic cancer Mother        AT 36 years  . Breast cancer Sister        appx-55 years  . Pancreatic cancer Maternal Grandmother     ALLERGIES:  is allergic to seasonal ic [cholestatin].  MEDICATIONS:  Current Outpatient Medications  Medication Sig Dispense Refill  . Chlorpheniramine Maleate (ALLERGY RELIEF PO) Take 1 tablet by mouth daily  as needed (allergies).    . CINNAMON PO Take 1 tablet by mouth daily.    . diphenoxylate-atropine (LOMOTIL) 2.5-0.025 MG tablet Take 1 tablet by mouth 4 (four) times daily as needed for diarrhea or loose stools. Take it along with immodium 60 tablet 0  . GARLIC PO Take 1 tablet by mouth daily.    Marland Kitchen glipiZIDE (GLUCOTROL) 5 MG tablet Take 1 tablet (5 mg total) by mouth 2 (two) times daily before a meal. 60 tablet 3  . lidocaine-prilocaine (EMLA) cream Apply 1 application topically as needed. Apply on the port 30-45 mins prior to port access 30 g 1  . losartan (COZAAR) 25 MG tablet Take 25 mg by mouth every morning.     . Multiple Vitamins-Minerals (MULTIVITAMIN WITH MINERALS) tablet Take 1 tablet by  mouth daily.    . ondansetron (ZOFRAN) 8 MG tablet One pill every 8 hours as needed for nausea/vomitting. 40 tablet 1  . potassium chloride SA (KLOR-CON) 20 MEQ tablet 1 pill twice a day 60 tablet 3  . pravastatin (PRAVACHOL) 10 MG tablet Take 10 mg by mouth 4 (four) times a week.    . traMADol (ULTRAM) 50 MG tablet Take 1 tablet (50 mg total) by mouth every 8 (eight) hours as needed. Watch out dizziness after taking the pills. Caution with driving 45 tablet 0  . oxyCODONE (OXY IR/ROXICODONE) 5 MG immediate release tablet Take 1-2 tablets (5-10 mg total) by mouth every 6 (six) hours as needed for severe pain. (Patient not taking: Reported on 05/18/2021) 60 tablet 0  . prochlorperazine (COMPAZINE) 10 MG tablet Take 1 tablet (10 mg total) by mouth every 6 (six) hours as needed for nausea or vomiting. (Patient not taking: Reported on 05/18/2021) 40 tablet 1   No current facility-administered medications for this visit.   Facility-Administered Medications Ordered in Other Visits  Medication Dose Route Frequency Provider Last Rate Last Admin  . 0.9 %  sodium chloride infusion   Intravenous Once Charlaine Dalton R, MD      . dexamethasone (DECADRON) 10 mg in sodium chloride 0.9 % 50 mL IVPB  10 mg Intravenous Once Cammie Sickle, MD      . diphenoxylate-atropine (LOMOTIL) 2.5-0.025 MG per tablet 1 tablet  1 tablet Oral Once Charlaine Dalton R, MD      . fluorouracil (ADRUCIL) 5,000 mg in sodium chloride 0.9 % 150 mL chemo infusion  5,000 mg Intravenous 1 day or 1 dose Charlaine Dalton R, MD      . heparin lock flush 100 UNIT/ML injection           . heparin lock flush 100 unit/mL  500 Units Intravenous Once Charlaine Dalton R, MD      . irinotecan LIPOSOME (ONIVYDE) 150 mg in sodium chloride 0.9 % 500 mL chemo infusion  150 mg Intravenous Once Charlaine Dalton R, MD      . leucovorin 850 mg in sodium chloride 0.9 % 250 mL infusion  850 mg Intravenous Once Charlaine Dalton R, MD       . palonosetron (ALOXI) injection 0.25 mg  0.25 mg Intravenous Once Charlaine Dalton R, MD      . sodium chloride flush (NS) 0.9 % injection 10 mL  10 mL Intravenous PRN Cammie Sickle, MD   10 mL at 04/20/21 0825      .  PHYSICAL EXAMINATION: ECOG PERFORMANCE STATUS: 0 - Asymptomatic  Vitals:   05/18/21 0851  BP: 132/72  Pulse: 97  Resp:  16  Temp: 98.7 F (37.1 C)  SpO2: 100%   Filed Weights   05/18/21 0851  Weight: 210 lb (95.3 kg)    Physical Exam HENT:     Head: Normocephalic and atraumatic.     Mouth/Throat:     Pharynx: No oropharyngeal exudate.  Eyes:     Pupils: Pupils are equal, round, and reactive to light.  Cardiovascular:     Rate and Rhythm: Normal rate and regular rhythm.  Pulmonary:     Effort: Pulmonary effort is normal. No respiratory distress.     Breath sounds: Normal breath sounds. No wheezing.  Abdominal:     General: Bowel sounds are normal. There is distension.     Palpations: Abdomen is soft. There is no mass.     Tenderness: There is no abdominal tenderness. There is no guarding or rebound.  Musculoskeletal:        General: No tenderness. Normal range of motion.     Cervical back: Normal range of motion and neck supple.  Skin:    General: Skin is warm.  Neurological:     Mental Status: She is alert and oriented to person, place, and time.  Psychiatric:        Mood and Affect: Affect normal.     LABORATORY DATA:  I have reviewed the data as listed Lab Results  Component Value Date   WBC 9.2 05/18/2021   HGB 8.4 (L) 05/18/2021   HCT 27.4 (L) 05/18/2021   MCV 92.6 05/18/2021   PLT 314 05/18/2021   Recent Labs    04/20/21 0810 05/04/21 0820 05/18/21 0825  NA 135 137 138  K 4.3 3.8 4.0  CL 102 104 104  CO2 '25 26 25  ' GLUCOSE 135* 152* 98  BUN 25* 14 17  CREATININE 0.97 0.86 0.93  CALCIUM 8.9 8.6* 8.9  GFRNONAA >60 >60 >60  PROT 7.6 6.9 7.5  ALBUMIN 4.2 3.7 4.0  AST '18 19 23  ' ALT '13 12 16  ' ALKPHOS 57 80 79   BILITOT 0.5 <0.1* 0.5    RADIOGRAPHIC STUDIES: I have personally reviewed the radiological images as listed and agreed with the findings in the report. CT CHEST ABDOMEN PELVIS W CONTRAST  Result Date: 05/04/2021 CLINICAL DATA:  Restaging gallbladder cancer. Current chemotherapy and immunotherapy. Pain in the right upper quadrant. EXAM: CT CHEST, ABDOMEN, AND PELVIS WITH CONTRAST TECHNIQUE: Multidetector CT imaging of the chest, abdomen and pelvis was performed following the standard protocol during bolus administration of intravenous contrast. CONTRAST:  188m OMNIPAQUE IOHEXOL 350 MG/ML SOLN COMPARISON:  Multiple exams, including abdominal MR from 11/03/2020 and chest CT from 05/31/2020 FINDINGS: CT CHEST FINDINGS Cardiovascular: Right Port-A-Cath tip: Upper IVC near the inferior cavoatrial junction. Coronary, aortic arch, and branch vessel atherosclerotic vascular disease. Mild cardiomegaly. Mediastinum/Nodes: Right paratracheal node 0.9 cm in short axis on image 18 series 2, previously 1.1 cm. Adjacent lower right paratracheal lymph node measures 1.2 cm in short axis on image 20 series 2 (previously 1.3 cm) but has a fatty hilum. Small type 1 hiatal hernia. Lungs/Pleura: Trace right pleural effusion. Stable 4 by 3 mm subpleural nodule in the right upper lobe along the minor fissure on image 62 series 3, probably a subpleural lymph node. A separate 3 mm right middle lobe subpleural nodule is present along the major fissure on image 74 series 3, image crease conspicuity compared to prior chest CT. A 0.6 by 0.4 cm right middle lobe subpleural nodule on image 82 of series  3 is newly appreciable but may have been previously obscured by prior atelectasis in the right middle lobe. Musculoskeletal: Degenerative subcortical cyst or geode inferiorly in the right glenoid. Thoracic spondylosis. CT ABDOMEN PELVIS FINDINGS Hepatobiliary: New enhancing right hepatic lobe masses along the gallbladder fossa highly  suspicious for malignancy. One of these measures 4.6 by 2.5 cm on image 61 series 2. Another adjacent lesion measures 2.0 by 1.4 cm on image 63 series 2. Stable nonspecific hypodense lesion posteriorly in the right hepatic lobe measuring 1.1 by 0.8 cm on image 72 series 2. Newly appreciable nonspecific hypodense 0.7 by 0.5 cm lesion in the dome of the right hepatic lobe on image 38 series 2. Pancreas: Unremarkable Spleen: Unremarkable Adrenals/Urinary Tract: Both adrenal glands appear normal. 2.0 by 1.4 cm Bosniak category 1 cyst of the left kidney lower pole. Stomach/Bowel: Scattered diverticula of the descending and proximal sigmoid colon. Vascular/Lymphatic: A porta hepatis/peripancreatic lymph node measures 1.7 cm in short axis on image 54 series 2, formerly 0.8 cm in short axis on 11/03/2020. Another porta hepatis lymph node measures 1.8 cm in short axis on image 59 series 2, formerly 1.1 cm. Mild atherosclerosis is present, including aortoiliac atherosclerotic disease. Reproductive: Uterus absent.  Adnexa unremarkable. Other: Subtle nodularity along the omentum particularly in the left upper quadrant for example on image 68 series 2, early peritoneal spread of tumor not excluded. No current ascites. Musculoskeletal: Small umbilical hernia contains adipose tissue. Left paraumbilical small defect along the rectus abdominus on image 80 series 2, possibly postoperative. IMPRESSION: 1. New masses in the right hepatic lobe adjacent to the gallbladder fossa, compatible with recurrent malignancy. There is also a new small hypodense lesion in the dome of the right hepatic lobe which is technically nonspecific. 2. Scattered faint nodularity along the omentum is nonspecific but could indicate early peritoneal spread of tumor. No overt ascites at this time. 3. Stable posteroinferior right hepatic lobe lesion. 4. Increasing porta hepatis adenopathy. Borderline enlarged paratracheal lymph nodes are slightly reduced in size  from prior. 5. Small subpleural nodularity in the right middle lobe merits surveillance. 6. Other imaging findings of potential clinical significance: Aortic Atherosclerosis (ICD10-I70.0). Coronary atherosclerosis. Mild cardiomegaly. Small type 1 hiatal hernia. Trace right pleural effusion. Mild colonic diverticulosis. Small umbilical hernia and small left paraumbilical defect in the rectus abdominus muscle. Electronically Signed   By: Van Clines M.D.   On: 05/04/2021 07:59     ASSESSMENT & PLAN:   Primary adenocarcinoma of gall bladder [facility]) #Metastatic gallbladder adenocarcinomas/p  with cycle #6--CT scan Oct 11th, 2022-shows progressive disease-new masses in the right hepatic lobe/adjacent to gallbladder with recurrent malignancy.  Also new lesion in the right hepatic lobe concerning for metastasis.  Faint nodularity of omentum/peritoneal disease; periportal adenopathy increased.  Rising tumor marker; CA 19-9- rising;  Progression noted on chemotherapy-gemcitabine and cisplatin  + Durvalumab. Discontinue therapy.   # Proceed with 5-FU based chemotherapy; with nab-iri every 2 weeks.  Patient understand treatments are palliative not curative.  Discussed the potential side effect of nausea vomiting and diarrhea.   # Anemia sec to chemo-hemoglobin today is 8.3; hold off PRBC transfusion.  Monitor closely. Monitor closely.  We will add Lomotil as premedication.  #Recommend Imodium one 1 pill after each bowel movement; up to 8 a day.  [Over the counter];  If diarrhea not improved with Imodium, then can take Lomotil/prescription sent. Drink plenty of fluids.   # Abdominal distention/abdominal pain- likely malignant ascites; s/p US/paracentsis-clinically stable.  No  evidence of any progressive ascites noted on the CT scan.  # Blood glucose-s/p P-BF-BG-156 [HbA1c- May 5.5; continue glipizide 5 mg BID-stable  # HTN-  continue with cozaar-STABLE.   # Reflux- recommend PPI-once a day; in AM.   #  Hypokalemia- 3.7- sec to cisplatin- improved; continue Kdur 20 meQ once a day- STABLE.   # Right upper quadrant pain-likely secondary malignancy.  Recommend tramadol prescription given.  Recommend monitoring for dizziness/drowsiness.  # DISPOSITION: # proceed with 5FU+ Onivide # 1 week-  labs- cbc/bmp; HOLD tube; Possible IVFs over 1 hours # Follow up in 2 weeks- MD; labs- cbc/cmp/ca-19-9; %FU+onivide; chemo- pump off 48 hours later-Dr.B   All questions were answered. The patient knows to call the clinic with any problems, questions or concerns.   Cammie Sickle, MD 05/18/2021 9:27 AM

## 2021-05-18 NOTE — Progress Notes (Signed)
Nutrition Follow-up:  Patient with metastatic gall bladder cancer.  Patient receiving gemcitabine, cisplatin, durvalumab.    Met with patient during infusion.  Patient reports that appetite is doing well.  Eating well balanced diet, including good sources of protein.  Drinking ensure when unable to eat full meal.  Denies any nutrition impact symptoms at this time   Medications: reviewed  Labs: reviewed  Anthropometrics:   Weight 210 lb today 208 lb 9.6 oz on 8/17 205 lb on 7/27 UBW of 213-217 lb  NUTRITION DIAGNOSIS: Unintentional weight loss improved   INTERVENTION:  Complimentary case of ensure enlive given to patient today.  Patient to continue well balanced diet, including good sources of protein.  Patient to contact RD if needed    MONITORING, EVALUATION, GOAL: weight trends, intake   NEXT VISIT: as needed  Kristina Orozco, [place], [place] Registered Dietitian [phone] (mobile)

## 2021-05-18 NOTE — Patient Instructions (Signed)
[facility]  Discharge Instructions: Thank you for choosing [facility] to provide your oncology and hematology care.  If you have a lab appointment with the [facility], please go directly to the [facility] and check in at the registration area.  Wear comfortable clothing and clothing appropriate for easy access to any Portacath or PICC line.   We strive to give you quality time with your provider. You may need to reschedule your appointment if you arrive late (15 or more minutes).  Arriving late affects you and other patients whose appointments are after yours.  Also, if you miss three or more appointments without notifying the office, you may be dismissed from the clinic at the provider's discretion.      For prescription refill requests, have your pharmacy contact our office and allow 72 hours for refills to be completed.    Today you received the following chemotherapy and/or immunotherapy agents: Irinotecan liposomal, leucovorin, fluorouracil The chemotherapy medication bag should finish at 46 hours, 96 hours, or 7 days. For example, if your pump is scheduled for 46 hours and it was put on at 4:00 p.m., it should finish at 2:00 p.m. the day it is scheduled to come off regardless of your appointment time.     Estimated time to finish at .   If the display on your pump reads "Low Volume" and it is beeping, take the batteries out of the pump and come to the cancer center for it to be taken off.   If the pump alarms go off prior to the pump reading "Low Volume" then call [phone] and someone can assist you.  If the plunger comes out and the chemotherapy medication is leaking out, please use your home chemo spill kit to clean up the spill. Do NOT use paper towels or other household products.  If you have problems or questions regarding your pump, please call either [phone] (24 hours a day) or the cancer center Monday-Friday  8:00 a.m.- 4:30 p.m. at the clinic number and we will assist you. If you are unable to get assistance, then go to the nearest Emergency Department and ask the staff to contact the IV team for assistance.        To help prevent nausea and vomiting after your treatment, we encourage you to take your nausea medication as directed.  BELOW ARE SYMPTOMS THAT SHOULD BE REPORTED IMMEDIATELY: *FEVER GREATER THAN 100.4 F (38 C) OR HIGHER *CHILLS OR SWEATING *NAUSEA AND VOMITING THAT IS NOT CONTROLLED WITH YOUR NAUSEA MEDICATION *UNUSUAL SHORTNESS OF BREATH *UNUSUAL BRUISING OR BLEEDING *URINARY PROBLEMS (pain or burning when urinating, or frequent urination) *BOWEL PROBLEMS (unusual diarrhea, constipation, pain near the anus) TENDERNESS IN MOUTH AND THROAT WITH OR WITHOUT PRESENCE OF ULCERS (sore throat, sores in mouth, or a toothache) UNUSUAL RASH, SWELLING OR PAIN  UNUSUAL VAGINAL DISCHARGE OR ITCHING   Items with * indicate a potential emergency and should be followed up as soon as possible or go to the Emergency Department if any problems should occur.  Please show the CHEMOTHERAPY ALERT CARD or IMMUNOTHERAPY ALERT CARD at check-in to the Emergency Department and triage nurse.  Should you have questions after your visit or need to cancel or reschedule your appointment, please contact [facility]  [phone] and follow the prompts.  Office hours are 8:00 a.m. to 4:30 p.m. Monday - Friday. Please note that voicemails left after 4:00 p.m. may not be  returned until the following business day.  We are closed weekends and major holidays. You have access to a nurse at all times for urgent questions. Please call the main number to the clinic [phone] and follow the prompts.  For any non-urgent questions, you may also contact your provider using MyChart. We now offer e-Visits for anyone 14 and older to request care online for non-urgent symptoms. For details visit  [email or web address].   Also download the MyChart app! Go to the app store, search "MyChart", open the app, select [facility], and log in with your MyChart username and password.  Due to Covid, a mask is required upon entering the hospital/clinic. If you do not have a mask, one will be given to you upon arrival. For doctor visits, patients may have 1 support person aged 7 or older with them. For treatment visits, patients cannot have anyone with them due to current Covid guidelines and our immunocompromised population. Leucovorin injection What is this medication? LEUCOVORIN (loo koe VOR in) is used to prevent or treat the harmful effects of some medicines. This medicine is used to treat anemia caused by a low amount of folic acid in the body. It is also used with 5-fluorouracil (5-FU) to treat colon cancer. This medicine may be used for other purposes; ask your health care provider or pharmacist if you have questions. What should I tell my care team before I take this medication? They need to know if you have any of these conditions: anemia from low levels of vitamin B-12 in the blood an unusual or allergic reaction to leucovorin, folic acid, other medicines, foods, dyes, or preservatives pregnant or trying to get pregnant breast-feeding How should I use this medication? This medicine is for injection into a muscle or into a vein. It is given by a health care professional in a hospital or clinic setting. Talk to your pediatrician regarding the use of this medicine in children. Special care may be needed. Overdosage: If you think you have taken too much of this medicine contact a poison control center or emergency room at once. NOTE: This medicine is only for you. Do not share this medicine with others. What if I miss a dose? This does not apply. What may interact with this medication? capecitabine fluorouracil phenobarbital phenytoin primidone trimethoprim-sulfamethoxazole This list  may not describe all possible interactions. Give your health care provider a list of all the medicines, herbs, non-prescription drugs, or dietary supplements you use. Also tell them if you smoke, drink alcohol, or use illegal drugs. Some items may interact with your medicine. What should I watch for while using this medication? Your condition will be monitored carefully while you are receiving this medicine. This medicine may increase the side effects of 5-fluorouracil, 5-FU. Tell your doctor or health care professional if you have diarrhea or mouth sores that do not get better or that get worse. What side effects may I notice from receiving this medication? Side effects that you should report to your doctor or health care professional as soon as possible: allergic reactions like skin rash, itching or hives, swelling of the face, lips, or tongue breathing problems fever, infection mouth sores unusual bleeding or bruising unusually weak or tired Side effects that usually do not require medical attention (report to your doctor or health care professional if they continue or are bothersome): constipation or diarrhea loss of appetite nausea, vomiting This list may not describe all possible side effects. Call your doctor for medical advice about side  effects. You may report side effects to FDA at 1-800-FDA-1088. Where should I keep my medication? This drug is given in a hospital or clinic and will not be stored at home. NOTE: This sheet is a summary. It may not cover all possible information. If you have questions about this medicine, talk to your doctor, pharmacist, or health care provider.  2022 Elsevier/Gold Standard (2008-01-14 16:50:29) Fluorouracil, 5-FU injection What is this medication? FLUOROURACIL, 5-FU (flure oh YOOR a sil) is a chemotherapy drug. It slows the growth of cancer cells. This medicine is used to treat many types of cancer like breast cancer, colon or rectal cancer,  pancreatic cancer, and stomach cancer. This medicine may be used for other purposes; ask your health care provider or pharmacist if you have questions. COMMON BRAND NAME(S): Adrucil What should I tell my care team before I take this medication? They need to know if you have any of these conditions: blood disorders dihydropyrimidine dehydrogenase (DPD) deficiency infection (especially a virus infection such as chickenpox, cold sores, or herpes) kidney disease liver disease malnourished, poor nutrition recent or ongoing radiation therapy an unusual or allergic reaction to fluorouracil, other chemotherapy, other medicines, foods, dyes, or preservatives pregnant or trying to get pregnant breast-feeding How should I use this medication? This drug is given as an infusion or injection into a vein. It is administered in a hospital or clinic by a specially trained health care professional. Talk to your pediatrician regarding the use of this medicine in children. Special care may be needed. Overdosage: If you think you have taken too much of this medicine contact a poison control center or emergency room at once. NOTE: This medicine is only for you. Do not share this medicine with others. What if I miss a dose? It is important not to miss your dose. Call your doctor or health care professional if you are unable to keep an appointment. What may interact with this medication? Do not take this medicine with any of the following medications: live virus vaccines This medicine may also interact with the following medications: medicines that treat or prevent blood clots like warfarin, enoxaparin, and dalteparin This list may not describe all possible interactions. Give your health care provider a list of all the medicines, herbs, non-prescription drugs, or dietary supplements you use. Also tell them if you smoke, drink alcohol, or use illegal drugs. Some items may interact with your medicine. What should  I watch for while using this medication? Visit your doctor for checks on your progress. This drug may make you feel generally unwell. This is not uncommon, as chemotherapy can affect healthy cells as well as cancer cells. Report any side effects. Continue your course of treatment even though you feel ill unless your doctor tells you to stop. In some cases, you may be given additional medicines to help with side effects. Follow all directions for their use. Call your doctor or health care professional for advice if you get a fever, chills or sore throat, or other symptoms of a cold or flu. Do not treat yourself. This drug decreases your body's ability to fight infections. Try to avoid being around people who are sick. This medicine may increase your risk to bruise or bleed. Call your doctor or health care professional if you notice any unusual bleeding. Be careful brushing and flossing your teeth or using a toothpick because you may get an infection or bleed more easily. If you have any dental work done, tell your dentist you  are receiving this medicine. Avoid taking products that contain aspirin, acetaminophen, ibuprofen, naproxen, or ketoprofen unless instructed by your doctor. These medicines may hide a fever. Do not become pregnant while taking this medicine. Women should inform their doctor if they wish to become pregnant or think they might be pregnant. There is a potential for serious side effects to an unborn child. Talk to your health care professional or pharmacist for more information. Do not breast-feed an infant while taking this medicine. Men should inform their doctor if they wish to father a child. This medicine may lower sperm counts. Do not treat diarrhea with over the counter products. Contact your doctor if you have diarrhea that lasts more than 2 days or if it is severe and watery. This medicine can make you more sensitive to the sun. Keep out of the sun. If you cannot avoid being in  the sun, wear protective clothing and use sunscreen. Do not use sun lamps or tanning beds/booths. What side effects may I notice from receiving this medication? Side effects that you should report to your doctor or health care professional as soon as possible: allergic reactions like skin rash, itching or hives, swelling of the face, lips, or tongue low blood counts - this medicine may decrease the number of white blood cells, red blood cells and platelets. You may be at increased risk for infections and bleeding. signs of infection - fever or chills, cough, sore throat, pain or difficulty passing urine signs of decreased platelets or bleeding - bruising, pinpoint red spots on the skin, black, tarry stools, blood in the urine signs of decreased red blood cells - unusually weak or tired, fainting spells, lightheadedness breathing problems changes in vision chest pain mouth sores nausea and vomiting pain, swelling, redness at site where injected pain, tingling, numbness in the hands or feet redness, swelling, or sores on hands or feet stomach pain unusual bleeding Side effects that usually do not require medical attention (report to your doctor or health care professional if they continue or are bothersome): changes in finger or toe nails diarrhea dry or itchy skin hair loss headache loss of appetite sensitivity of eyes to the light stomach upset unusually teary eyes This list may not describe all possible side effects. Call your doctor for medical advice about side effects. You may report side effects to FDA at 1-800-FDA-1088. Where should I keep my medication? This drug is given in a hospital or clinic and will not be stored at home. NOTE: This sheet is a summary. It may not cover all possible information. If you have questions about this medicine, talk to your doctor, pharmacist, or health care provider.  2022 Elsevier/Gold Standard (2019-06-10 15:00:03) Irinotecan Liposomal  injection What is this medication? IRINOTECAN LIPOSOME (eye ri noe TEE kan LIP oh som) is a chemotherapy drug. It is used to treat pancreatic cancer. This medicine may be used for other purposes; ask your health care provider or pharmacist if you have questions. COMMON BRAND NAME(S): ONIVYDE What should I tell my care team before I take this medication? They need to know if you have any of these conditions: bleeding disorders dehydration history of blood diseases, like sickle cell anemia or leukemia history of low levels of calcium, magnesium, or potassium in the blood infection (especially a virus infection such as chickenpox, cold sores, or herpes) liver disease low blood counts, like low white cell, platelet, or red cell counts lung or breathing disease, like asthma recent or ongoing radiation therapy  an unusual or allergic reaction to irinotecan liposome, other medicines, foods, dyes, or preservatives pregnant or trying to get pregnant breast-feeding How should I use this medication? This drug is given as an infusion into a vein. It is administered in a hospital or clinic by a specially trained health care professional. Talk to your pediatrician regarding the use of this medicine in children. Special care may be needed. Overdosage: If you think you have taken too much of this medicine contact a poison control center or emergency room at once. NOTE: This medicine is only for you. Do not share this medicine with others. What if I miss a dose? It is important not to miss your dose. Call your doctor or health care professional if you are unable to keep an appointment. What may interact with this medication? This medicine may interact with the following medications: antiviral medicines for HIV or AIDS certain medications for fungal infections like ketoconazole, itraconazole, voriconazole certain medications for seizures like carbamazepine, fosphenytoin,  phenytoin clarithromycin gemfibrozil mephobarbital nefazodone phenobarbital primidone rifabutin rifampin rifapentine St. John's Wort telaprevir This list may not describe all possible interactions. Give your health care provider a list of all the medicines, herbs, non-prescription drugs, or dietary supplements you use. Also tell them if you smoke, drink alcohol, or use illegal drugs. Some items may interact with your medicine. What should I watch for while using this medication? Check with your doctor or health care professional if you get an attack of severe diarrhea, nausea and vomiting, or if you sweat a lot. The loss of too much body fluid can make it dangerous for you to take this medicine. This medicine may interfere with the ability to have a child. You should talk with your doctor or health care professional if you are concerned about your fertility. Do not become pregnant while taking this medicine or for 1 month after the last dose; males with female partners should use condoms during treatment and for 4 months after the last dose. Women should inform their doctor if they wish to become pregnant or think they might be pregnant. There is a potential for serious side effects to an unborn child. Talk to your health care professional or pharmacist for more information. Do not breast-feed an infant while taking this medicine or for 1 month after the last dose. Avoid taking products that contain aspirin, acetaminophen, ibuprofen, naproxen, or ketoprofen unless instructed by your doctor. These medicines may hide a fever. Be careful brushing and flossing your teeth or using a toothpick because you may get an infection or bleed more easily. If you have any dental work done, tell your dentist you are receiving this medicine. Call your doctor or health care professional for advice if you get a fever, chills or sore throat, or other symptoms of a cold or flu. Do not treat yourself. This drug  decreases your body's ability to fight infections. Try to avoid being around people who are sick. This medicine may increase your risk to bruise or bleed. Call your doctor or health care professional if you notice any unusual bleeding. This drug may make you feel generally unwell. This is not uncommon, as chemotherapy can affect healthy cells as well as cancer cells. Report any side effects. Continue your course of treatment even though you feel ill unless your doctor tells you to stop. What side effects may I notice from receiving this medication? Side effects that you should report to your doctor or health care professional as soon as  possible: allergic reactions like skin rash, itching or hives, swelling of the face, lips, or tongue breathing problems cough diarrhea low blood counts - this medicine may decrease the number of white blood cells, red blood cells and platelets. You may be at increased risk for infections and bleeding nausea, vomiting signs and symptoms of bleeding such as bloody or black, tarry stools; red or dark-brown urine; spitting up blood or brown material that looks like coffee grounds; red spots on the skin; unusual bruising or bleeding from the eye, gums, or nose signs of decreased red blood cells - unusually weak or tired, feeling faint or lightheaded, falls signs and symptoms of infection like fever or chills; cough; sore throat; pain or trouble passing urine signs and symptoms of liver injury like dark yellow or brown urine; general ill feeling or flu-like symptoms; light-colored stools; loss of appetite; nausea; right upper belly pain; unusually weak or tired; yellowing of the eyes or skin stomach pain Side effects that usually do not require medical attention (report to your doctor or health care professional if they continue or are bothersome): hair loss loss of appetite mouth sores upset stomach This list may not describe all possible side effects. Call your  doctor for medical advice about side effects. You may report side effects to FDA at 1-800-FDA-1088. Where should I keep my medication? This drug is given in a hospital or clinic and will not be stored at home. NOTE: This sheet is a summary. It may not cover all possible information. If you have questions about this medicine, talk to your doctor, pharmacist, or health care provider.  2022 Elsevier/Gold Standard (2015-08-12 10:14:31)

## 2021-05-18 NOTE — Patient Instructions (Signed)
#  Recommend Imodium one 1 pill after each bowel movement; up to 8 a day.  [Over the counter]  # If diarrhea not improved with Imodium, then can take Lomotil/prescription sent.  #Keep yourself hydrated; drink plenty of fluids.

## 2021-05-18 NOTE — Progress Notes (Unsigned)
cbc

## 2021-05-18 NOTE — Assessment & Plan Note (Addendum)
#  Metastatic gallbladder adenocarcinomas/p  with cycle #6--CT scan Oct 11th, 2022-shows progressive disease-new masses in the right hepatic lobe/adjacent to gallbladder with recurrent malignancy.  Also new lesion in the right hepatic lobe concerning for metastasis.  Faint nodularity of omentum/peritoneal disease; periportal adenopathy increased.  Rising tumor marker; CA 19-9- rising;  Progression noted on chemotherapy-gemcitabine and cisplatin  + Durvalumab. Discontinue therapy.   # Proceed with 5-FU based chemotherapy; with nab-iri every 2 weeks.  Patient understand treatments are palliative not curative.  Discussed the potential side effect of nausea vomiting and diarrhea.   # Anemia sec to chemo-hemoglobin today is 8.3; hold off PRBC transfusion.  Monitor closely. Monitor closely.  We will add Lomotil as premedication.  #Recommend Imodium one 1 pill after each bowel movement; up to 8 a day.  [Over the counter];  If diarrhea not improved with Imodium, then can take Lomotil/prescription sent. Drink plenty of fluids.   # Abdominal distention/abdominal pain- likely malignant ascites; s/p US/paracentsis-clinically stable.  No evidence of any progressive ascites noted on the CT scan.  # Blood glucose-s/p [id number] [HbA1c- May 5.5; continue glipizide 5 mg BID-stable  # HTN-  continue with cozaar-STABLE.   # Reflux- recommend PPI-once a day; in AM.   # Hypokalemia- 3.7- sec to cisplatin- improved; continue Kdur 20 meQ once a day- STABLE.   # Right upper quadrant pain-likely secondary malignancy.  Recommend tramadol prescription given.  Recommend monitoring for dizziness/drowsiness.  # DISPOSITION: # proceed with 5FU+ Onivide # 1 week-  labs- cbc/bmp; HOLD tube; Possible IVFs over 1 hours # Follow up in 2 weeks- MD; labs- cbc/cmp/ca-19-9; %FU+onivide; chemo- pump off 48 hours later-Dr.B

## 2021-05-18 NOTE — Progress Notes (Signed)
Pt in for follow up, states having tenderness and pain in right abdominal area.

## 2021-05-19 LAB — CANCER ANTIGEN 19-9: CA 19-9: 6135 U/mL — ABNORMAL HIGH (ref 0–35)

## 2021-05-20 ENCOUNTER — Inpatient Hospital Stay: Payer: Medicare Other

## 2021-05-20 ENCOUNTER — Other Ambulatory Visit: Payer: Self-pay

## 2021-05-20 DIAGNOSIS — Z5111 Encounter for antineoplastic chemotherapy: Secondary | ICD-10-CM | POA: Diagnosis not present

## 2021-05-20 DIAGNOSIS — C23 Malignant neoplasm of gallbladder: Secondary | ICD-10-CM

## 2021-05-20 MED ORDER — SODIUM CHLORIDE 0.9% FLUSH
10.0000 mL | INTRAVENOUS | Status: DC | PRN
  Administered 2021-05-20: 10 mL
  Filled 2021-05-20: qty 10

## 2021-05-20 MED ORDER — HEPARIN SOD (PORK) LOCK FLUSH 100 UNIT/ML IV SOLN
500.0000 [IU] | Freq: Once | INTRAVENOUS | Status: AC | PRN
  Administered 2021-05-20: 500 [IU]
  Filled 2021-05-20: qty 5

## 2021-05-25 ENCOUNTER — Inpatient Hospital Stay: Payer: Medicare Other | Attending: Internal Medicine

## 2021-05-25 ENCOUNTER — Other Ambulatory Visit: Payer: Self-pay

## 2021-05-25 ENCOUNTER — Inpatient Hospital Stay: Payer: Medicare Other

## 2021-05-25 VITALS — BP 125/61 | HR 84 | Temp 98.4°F

## 2021-05-25 DIAGNOSIS — C23 Malignant neoplasm of gallbladder: Secondary | ICD-10-CM

## 2021-05-25 DIAGNOSIS — D6481 Anemia due to antineoplastic chemotherapy: Secondary | ICD-10-CM | POA: Diagnosis not present

## 2021-05-25 DIAGNOSIS — C787 Secondary malignant neoplasm of liver and intrahepatic bile duct: Secondary | ICD-10-CM | POA: Diagnosis not present

## 2021-05-25 DIAGNOSIS — Z5111 Encounter for antineoplastic chemotherapy: Secondary | ICD-10-CM | POA: Insufficient documentation

## 2021-05-25 LAB — CBC WITH DIFFERENTIAL/PLATELET
Abs Immature Granulocytes: 0.08 10*3/uL — ABNORMAL HIGH (ref 0.00–0.07)
Basophils Absolute: 0.1 10*3/uL (ref 0.0–0.1)
Basophils Relative: 1 %
Eosinophils Absolute: 0.3 10*3/uL (ref 0.0–0.5)
Eosinophils Relative: 4 %
HCT: 26.8 % — ABNORMAL LOW (ref 36.0–46.0)
Hemoglobin: 8.3 g/dL — ABNORMAL LOW (ref 12.0–15.0)
Immature Granulocytes: 1 %
Lymphocytes Relative: 26 %
Lymphs Abs: 2 10*3/uL (ref 0.7–4.0)
MCH: 28.6 pg (ref 26.0–34.0)
MCHC: 31 g/dL (ref 30.0–36.0)
MCV: 92.4 fL (ref 80.0–100.0)
Monocytes Absolute: 0.6 10*3/uL (ref 0.1–1.0)
Monocytes Relative: 8 %
Neutro Abs: 4.6 10*3/uL (ref 1.7–7.7)
Neutrophils Relative %: 60 %
Platelets: 263 10*3/uL (ref 150–400)
RBC: 2.9 MIL/uL — ABNORMAL LOW (ref 3.87–5.11)
RDW: 18.4 % — ABNORMAL HIGH (ref 11.5–15.5)
WBC: 7.7 10*3/uL (ref 4.0–10.5)
nRBC: 0 % (ref 0.0–0.2)

## 2021-05-25 LAB — SAMPLE TO BLOOD BANK

## 2021-05-25 LAB — BASIC METABOLIC PANEL
Anion gap: 11 (ref 5–15)
BUN: 17 mg/dL (ref 8–23)
CO2: 25 mmol/L (ref 22–32)
Calcium: 8.7 mg/dL — ABNORMAL LOW (ref 8.9–10.3)
Chloride: 100 mmol/L (ref 98–111)
Creatinine, Ser: 1.02 mg/dL — ABNORMAL HIGH (ref 0.44–1.00)
GFR, Estimated: >60 mL/min (ref >60)
Glucose, Bld: 125 mg/dL — ABNORMAL HIGH (ref 70–99)
Potassium: 3.6 mmol/L (ref 3.5–5.1)
Sodium: 136 mmol/L (ref 135–145)

## 2021-05-25 MED ORDER — SODIUM CHLORIDE 0.9% FLUSH
10.0000 mL | Freq: Once | INTRAVENOUS | Status: AC | PRN
  Administered 2021-05-25: 10 mL
  Filled 2021-05-25: qty 10

## 2021-05-25 MED ORDER — SODIUM CHLORIDE 0.9 % IV SOLN
Freq: Once | INTRAVENOUS | Status: AC
  Filled 2021-05-25: qty 250

## 2021-05-25 MED ORDER — HEPARIN SOD (PORK) LOCK FLUSH 100 UNIT/ML IV SOLN
500.0000 [IU] | Freq: Once | INTRAVENOUS | Status: AC | PRN
  Administered 2021-05-25: 500 [IU]
  Filled 2021-05-25: qty 5

## 2021-05-25 NOTE — Patient Instructions (Signed)

## 2021-05-25 NOTE — Progress Notes (Signed)
Pt hydrated with 1 L NS this am. Her driver has an MD appt this am, pt had to leave prior to IV bag being completely empty.

## 2021-06-01 ENCOUNTER — Inpatient Hospital Stay: Payer: Medicare Other

## 2021-06-01 ENCOUNTER — Inpatient Hospital Stay (HOSPITAL_BASED_OUTPATIENT_CLINIC_OR_DEPARTMENT_OTHER): Payer: Medicare Other | Admitting: Internal Medicine

## 2021-06-01 ENCOUNTER — Other Ambulatory Visit: Payer: Self-pay

## 2021-06-01 DIAGNOSIS — C23 Malignant neoplasm of gallbladder: Secondary | ICD-10-CM

## 2021-06-01 DIAGNOSIS — Z5111 Encounter for antineoplastic chemotherapy: Secondary | ICD-10-CM | POA: Diagnosis not present

## 2021-06-01 LAB — COMPREHENSIVE METABOLIC PANEL
ALT: 17 U/L (ref 0–44)
AST: 29 U/L (ref 15–41)
Albumin: 3.8 g/dL (ref 3.5–5.0)
Alkaline Phosphatase: 82 U/L (ref 38–126)
Anion gap: 9 (ref 5–15)
BUN: 17 mg/dL (ref 8–23)
CO2: 25 mmol/L (ref 22–32)
Calcium: 8.8 mg/dL — ABNORMAL LOW (ref 8.9–10.3)
Chloride: 101 mmol/L (ref 98–111)
Creatinine, Ser: 1.01 mg/dL — ABNORMAL HIGH (ref 0.44–1.00)
GFR, Estimated: >60 mL/min (ref >60)
Glucose, Bld: 137 mg/dL — ABNORMAL HIGH (ref 70–99)
Potassium: 3.8 mmol/L (ref 3.5–5.1)
Sodium: 135 mmol/L (ref 135–145)
Total Bilirubin: 0.5 mg/dL (ref 0.3–1.2)
Total Protein: 7.3 g/dL (ref 6.5–8.1)

## 2021-06-01 LAB — CBC WITH DIFFERENTIAL/PLATELET
Abs Immature Granulocytes: 0.03 10*3/uL (ref 0.00–0.07)
Basophils Absolute: 0.1 10*3/uL (ref 0.0–0.1)
Basophils Relative: 1 %
Eosinophils Absolute: 0.5 10*3/uL (ref 0.0–0.5)
Eosinophils Relative: 5 %
HCT: 26.7 % — ABNORMAL LOW (ref 36.0–46.0)
Hemoglobin: 8.4 g/dL — ABNORMAL LOW (ref 12.0–15.0)
Immature Granulocytes: 0 %
Lymphocytes Relative: 17 %
Lymphs Abs: 1.4 10*3/uL (ref 0.7–4.0)
MCH: 28.4 pg (ref 26.0–34.0)
MCHC: 31.5 g/dL (ref 30.0–36.0)
MCV: 90.2 fL (ref 80.0–100.0)
Monocytes Absolute: 1 10*3/uL (ref 0.1–1.0)
Monocytes Relative: 11 %
Neutro Abs: 5.7 10*3/uL (ref 1.7–7.7)
Neutrophils Relative %: 66 %
Platelets: 253 10*3/uL (ref 150–400)
RBC: 2.96 MIL/uL — ABNORMAL LOW (ref 3.87–5.11)
RDW: 17.7 % — ABNORMAL HIGH (ref 11.5–15.5)
WBC: 8.6 10*3/uL (ref 4.0–10.5)
nRBC: 0 % (ref 0.0–0.2)

## 2021-06-01 LAB — MAGNESIUM: Magnesium: 1.9 mg/dL (ref 1.7–2.4)

## 2021-06-01 MED ORDER — SODIUM CHLORIDE 0.9 % IV SOLN
2358.0000 mg/m2 | INTRAVENOUS | Status: DC
  Administered 2021-06-01: 5000 mg via INTRAVENOUS
  Filled 2021-06-01: qty 100

## 2021-06-01 MED ORDER — SODIUM CHLORIDE 0.9 % IV SOLN
10.0000 mg | Freq: Once | INTRAVENOUS | Status: AC
  Administered 2021-06-01: 10 mg via INTRAVENOUS
  Filled 2021-06-01: qty 10

## 2021-06-01 MED ORDER — PALONOSETRON HCL INJECTION 0.25 MG/5ML
0.2500 mg | Freq: Once | INTRAVENOUS | Status: AC
  Administered 2021-06-01: 0.25 mg via INTRAVENOUS
  Filled 2021-06-01: qty 5

## 2021-06-01 MED ORDER — SODIUM CHLORIDE 0.9 % IV SOLN
Freq: Once | INTRAVENOUS | Status: AC
  Filled 2021-06-01: qty 250

## 2021-06-01 MED ORDER — DIPHENOXYLATE-ATROPINE 2.5-0.025 MG PO TABS
1.0000 | ORAL_TABLET | Freq: Once | ORAL | Status: AC
  Administered 2021-06-01: 1 via ORAL
  Filled 2021-06-01: qty 1

## 2021-06-01 MED ORDER — HEPARIN SOD (PORK) LOCK FLUSH 100 UNIT/ML IV SOLN
500.0000 [IU] | Freq: Once | INTRAVENOUS | Status: DC
  Filled 2021-06-01: qty 5

## 2021-06-01 MED ORDER — SODIUM CHLORIDE 0.9 % IV SOLN
150.0000 mg | Freq: Once | INTRAVENOUS | Status: AC
  Administered 2021-06-01: 150 mg via INTRAVENOUS
  Filled 2021-06-01: qty 34.88

## 2021-06-01 MED ORDER — SODIUM CHLORIDE 0.9% FLUSH
10.0000 mL | INTRAVENOUS | Status: DC | PRN
  Administered 2021-06-01: 10 mL via INTRAVENOUS
  Filled 2021-06-01: qty 10

## 2021-06-01 MED ORDER — SODIUM CHLORIDE 0.9 % IV SOLN
401.0000 mg/m2 | Freq: Once | INTRAVENOUS | Status: AC
  Administered 2021-06-01: 850 mg via INTRAVENOUS
  Filled 2021-06-01: qty 42.5

## 2021-06-01 MED ORDER — GLIPIZIDE 5 MG PO TABS: 5.0000 mg | ORAL_TABLET | Freq: Two times a day (BID) | ORAL | 6 refills | Status: AC

## 2021-06-01 NOTE — Patient Instructions (Addendum)
Fluorouracil, 5-FU injection What is this medication? FLUOROURACIL, 5-FU (flure oh YOOR a sil) is a chemotherapy drug. It slows the growth of cancer cells. This medicine is used to treat many types of cancer like breast cancer, colon or rectal cancer, pancreatic cancer, and stomach cancer. This medicine may be used for other purposes; ask your health care provider or pharmacist if you have questions. COMMON BRAND NAME(S): Adrucil What should I tell my care team before I take this medication? They need to know if you have any of these conditions: blood disorders dihydropyrimidine dehydrogenase (DPD) deficiency infection (especially a virus infection such as chickenpox, cold sores, or herpes) kidney disease liver disease malnourished, poor nutrition recent or ongoing radiation therapy an unusual or allergic reaction to fluorouracil, other chemotherapy, other medicines, foods, dyes, or preservatives pregnant or trying to get pregnant breast-feeding How should I use this medication? This drug is given as an infusion or injection into a vein. It is administered in a hospital or clinic by a specially trained health care professional. Talk to your pediatrician regarding the use of this medicine in children. Special care may be needed. Overdosage: If you think you have taken too much of this medicine contact a poison control center or emergency room at once. NOTE: This medicine is only for you. Do not share this medicine with others. What if I miss a dose? It is important not to miss your dose. Call your doctor or health care professional if you are unable to keep an appointment. What may interact with this medication? Do not take this medicine with any of the following medications: live virus vaccines This medicine may also interact with the following medications: medicines that treat or prevent blood clots like warfarin, enoxaparin, and dalteparin This list may not describe all possible  interactions. Give your health care provider a list of all the medicines, herbs, non-prescription drugs, or dietary supplements you use. Also tell them if you smoke, drink alcohol, or use illegal drugs. Some items may interact with your medicine. What should I watch for while using this medication? Visit your doctor for checks on your progress. This drug may make you feel generally unwell. This is not uncommon, as chemotherapy can affect healthy cells as well as cancer cells. Report any side effects. Continue your course of treatment even though you feel ill unless your doctor tells you to stop. In some cases, you may be given additional medicines to help with side effects. Follow all directions for their use. Call your doctor or health care professional for advice if you get a fever, chills or sore throat, or other symptoms of a cold or flu. Do not treat yourself. This drug decreases your body's ability to fight infections. Try to avoid being around people who are sick. This medicine may increase your risk to bruise or bleed. Call your doctor or health care professional if you notice any unusual bleeding. Be careful brushing and flossing your teeth or using a toothpick because you may get an infection or bleed more easily. If you have any dental work done, tell your dentist you are receiving this medicine. Avoid taking products that contain aspirin, acetaminophen, ibuprofen, naproxen, or ketoprofen unless instructed by your doctor. These medicines may hide a fever. Do not become pregnant while taking this medicine. Women should inform their doctor if they wish to become pregnant or think they might be pregnant. There is a potential for serious side effects to an unborn child. Talk to your health care  professional or pharmacist for more information. Do not breast-feed an infant while taking this medicine. Men should inform their doctor if they wish to father a child. This medicine may lower sperm  counts. Do not treat diarrhea with over the counter products. Contact your doctor if you have diarrhea that lasts more than 2 days or if it is severe and watery. This medicine can make you more sensitive to the sun. Keep out of the sun. If you cannot avoid being in the sun, wear protective clothing and use sunscreen. Do not use sun lamps or tanning beds/booths. What side effects may I notice from receiving this medication? Side effects that you should report to your doctor or health care professional as soon as possible: allergic reactions like skin rash, itching or hives, swelling of the face, lips, or tongue low blood counts - this medicine may decrease the number of white blood cells, red blood cells and platelets. You may be at increased risk for infections and bleeding. signs of infection - fever or chills, cough, sore throat, pain or difficulty passing urine signs of decreased platelets or bleeding - bruising, pinpoint red spots on the skin, black, tarry stools, blood in the urine signs of decreased red blood cells - unusually weak or tired, fainting spells, lightheadedness breathing problems changes in vision chest pain mouth sores nausea and vomiting pain, swelling, redness at site where injected pain, tingling, numbness in the hands or feet redness, swelling, or sores on hands or feet stomach pain unusual bleeding Side effects that usually do not require medical attention (report to your doctor or health care professional if they continue or are bothersome): changes in finger or toe nails diarrhea dry or itchy skin hair loss headache loss of appetite sensitivity of eyes to the light stomach upset unusually teary eyes This list may not describe all possible side effects. Call your doctor for medical advice about side effects. You may report side effects to FDA at 1-800-FDA-1088. Where should I keep my medication? This drug is given in a hospital or clinic and will not be  stored at home. NOTE: This sheet is a summary. It may not cover all possible information. If you have questions about this medicine, talk to your doctor, pharmacist, or health care provider.  2022 Elsevier/Gold Standard (2021-03-29 00:00:00) Leucovorin injection What is this medication? LEUCOVORIN (loo koe VOR in) is used to prevent or treat the harmful effects of some medicines. This medicine is used to treat anemia caused by a low amount of folic acid in the body. It is also used with 5-fluorouracil (5-FU) to treat colon cancer. This medicine may be used for other purposes; ask your health care provider or pharmacist if you have questions. What should I tell my care team before I take this medication? They need to know if you have any of these conditions: anemia from low levels of vitamin B-12 in the blood an unusual or allergic reaction to leucovorin, folic acid, other medicines, foods, dyes, or preservatives pregnant or trying to get pregnant breast-feeding How should I use this medication? This medicine is for injection into a muscle or into a vein. It is given by a health care professional in a hospital or clinic setting. Talk to your pediatrician regarding the use of this medicine in children. Special care may be needed. Overdosage: If you think you have taken too much of this medicine contact a poison control center or emergency room at once. NOTE: This medicine is only for you.  Do not share this medicine with others. What if I miss a dose? This does not apply. What may interact with this medication? capecitabine fluorouracil phenobarbital phenytoin primidone trimethoprim-sulfamethoxazole This list may not describe all possible interactions. Give your health care provider a list of all the medicines, herbs, non-prescription drugs, or dietary supplements you use. Also tell them if you smoke, drink alcohol, or use illegal drugs. Some items may interact with your medicine. What  should I watch for while using this medication? Your condition will be monitored carefully while you are receiving this medicine. This medicine may increase the side effects of 5-fluorouracil, 5-FU. Tell your doctor or health care professional if you have diarrhea or mouth sores that do not get better or that get worse. What side effects may I notice from receiving this medication? Side effects that you should report to your doctor or health care professional as soon as possible: allergic reactions like skin rash, itching or hives, swelling of the face, lips, or tongue breathing problems fever, infection mouth sores unusual bleeding or bruising unusually weak or tired Side effects that usually do not require medical attention (report to your doctor or health care professional if they continue or are bothersome): constipation or diarrhea loss of appetite nausea, vomiting This list may not describe all possible side effects. Call your doctor for medical advice about side effects. You may report side effects to FDA at 1-800-FDA-1088. Where should I keep my medication? This drug is given in a hospital or clinic and will not be stored at home. NOTE: This sheet is a summary. It may not cover all possible information. If you have questions about this medicine, talk to your doctor, pharmacist, or health care provider.  2022 Elsevier/Gold Standard (2008-01-16 00:00:00) Irinotecan Liposomal injection What is this medication? IRINOTECAN LIPOSOME (eye ri noe TEE kan LIP oh som) is a chemotherapy drug. It is used to treat pancreatic cancer. This medicine may be used for other purposes; ask your health care provider or pharmacist if you have questions. COMMON BRAND NAME(S): ONIVYDE What should I tell my care team before I take this medication? They need to know if you have any of these conditions: bleeding disorders dehydration history of blood diseases, like sickle cell anemia or leukemia history  of low levels of calcium, magnesium, or potassium in the blood infection (especially a virus infection such as chickenpox, cold sores, or herpes) liver disease low blood counts, like low white cell, platelet, or red cell counts lung or breathing disease, like asthma recent or ongoing radiation therapy an unusual or allergic reaction to irinotecan liposome, other medicines, foods, dyes, or preservatives pregnant or trying to get pregnant breast-feeding How should I use this medication? This drug is given as an infusion into a vein. It is administered in a hospital or clinic by a specially trained health care professional. Talk to your pediatrician regarding the use of this medicine in children. Special care may be needed. Overdosage: If you think you have taken too much of this medicine contact a poison control center or emergency room at once. NOTE: This medicine is only for you. Do not share this medicine with others. What if I miss a dose? It is important not to miss your dose. Call your doctor or health care professional if you are unable to keep an appointment. What may interact with this medication? This medicine may interact with the following medications: antiviral medicines for HIV or AIDS certain medications for fungal infections like ketoconazole, itraconazole,  voriconazole certain medications for seizures like carbamazepine, fosphenytoin, phenytoin clarithromycin gemfibrozil mephobarbital nefazodone phenobarbital primidone rifabutin rifampin rifapentine St. John's Wort telaprevir This list may not describe all possible interactions. Give your health care provider a list of all the medicines, herbs, non-prescription drugs, or dietary supplements you use. Also tell them if you smoke, drink alcohol, or use illegal drugs. Some items may interact with your medicine. What should I watch for while using this medication? Check with your doctor or health care professional if you  get an attack of severe diarrhea, nausea and vomiting, or if you sweat a lot. The loss of too much body fluid can make it dangerous for you to take this medicine. This medicine may interfere with the ability to have a child. You should talk with your doctor or health care professional if you are concerned about your fertility. Do not become pregnant while taking this medicine or for 1 month after the last dose; males with female partners should use condoms during treatment and for 4 months after the last dose. Women should inform their doctor if they wish to become pregnant or think they might be pregnant. There is a potential for serious side effects to an unborn child. Talk to your health care professional or pharmacist for more information. Do not breast-feed an infant while taking this medicine or for 1 month after the last dose. Avoid taking products that contain aspirin, acetaminophen, ibuprofen, naproxen, or ketoprofen unless instructed by your doctor. These medicines may hide a fever. Be careful brushing and flossing your teeth or using a toothpick because you may get an infection or bleed more easily. If you have any dental work done, tell your dentist you are receiving this medicine. Call your doctor or health care professional for advice if you get a fever, chills or sore throat, or other symptoms of a cold or flu. Do not treat yourself. This drug decreases your body's ability to fight infections. Try to avoid being around people who are sick. This medicine may increase your risk to bruise or bleed. Call your doctor or health care professional if you notice any unusual bleeding. This drug may make you feel generally unwell. This is not uncommon, as chemotherapy can affect healthy cells as well as cancer cells. Report any side effects. Continue your course of treatment even though you feel ill unless your doctor tells you to stop. What side effects may I notice from receiving this medication? Side  effects that you should report to your doctor or health care professional as soon as possible: allergic reactions like skin rash, itching or hives, swelling of the face, lips, or tongue breathing problems cough diarrhea low blood counts - this medicine may decrease the number of white blood cells, red blood cells and platelets. You may be at increased risk for infections and bleeding nausea, vomiting signs and symptoms of bleeding such as bloody or black, tarry stools; red or dark-brown urine; spitting up blood or brown material that looks like coffee grounds; red spots on the skin; unusual bruising or bleeding from the eye, gums, or nose signs of decreased red blood cells - unusually weak or tired, feeling faint or lightheaded, falls signs and symptoms of infection like fever or chills; cough; sore throat; pain or trouble passing urine signs and symptoms of liver injury like dark yellow or brown urine; general ill feeling or flu-like symptoms; light-colored stools; loss of appetite; nausea; right upper belly pain; unusually weak or tired; yellowing of the eyes  or skin stomach pain Side effects that usually do not require medical attention (report to your doctor or health care professional if they continue or are bothersome): hair loss loss of appetite mouth sores upset stomach This list may not describe all possible side effects. Call your doctor for medical advice about side effects. You may report side effects to FDA at 1-800-FDA-1088. Where should I keep my medication? This drug is given in a hospital or clinic and will not be stored at home. NOTE: This sheet is a summary. It may not cover all possible information. If you have questions about this medicine, talk to your doctor, pharmacist, or health care provider.  2022 Elsevier/Gold Standard (2015-08-12 00:00:00) [facility]  Discharge Instructions: Thank you for choosing [facility] to  provide your oncology and hematology care.  If you have a lab appointment with the [facility], please go directly to the [facility] and check in at the registration area.  Wear comfortable clothing and clothing appropriate for easy access to any Portacath or PICC line.   We strive to give you quality time with your provider. You may need to reschedule your appointment if you arrive late (15 or more minutes).  Arriving late affects you and other patients whose appointments are after yours.  Also, if you miss three or more appointments without notifying the office, you may be dismissed from the clinic at the provider's discretion.      For prescription refill requests, have your pharmacy contact our office and allow 72 hours for refills to be completed.    Today you received the following chemotherapy and/or immunotherapy agents The chemotherapy medication bag should finish at 46 hours, 96 hours, or 7 days. For example, if your pump is scheduled for 46 hours and it was put on at 4:00 p.m., it should finish at 2:00 p.m. the day it is scheduled to come off regardless of your appointment time.     Estimated time to finish at .   If the display on your pump reads "Low Volume" and it is beeping, take the batteries out of the pump and come to the cancer center for it to be taken off.   If the pump alarms go off prior to the pump reading "Low Volume" then call [phone] and someone can assist you.  If the plunger comes out and the chemotherapy medication is leaking out, please use your home chemo spill kit to clean up the spill. Do NOT use paper towels or other household products.  If you have problems or questions regarding your pump, please call either [phone] (24 hours a day) or the cancer center Monday-Friday 8:00 a.m.- 4:30 p.m. at the clinic number and we will assist you. If you are unable to get assistance, then go to the nearest Emergency Department and ask the staff to contact  the IV team for assistance.      To help prevent nausea and vomiting after your treatment, we encourage you to take your nausea medication as directed.  BELOW ARE SYMPTOMS THAT SHOULD BE REPORTED IMMEDIATELY: *FEVER GREATER THAN 100.4 F (38 C) OR HIGHER *CHILLS OR SWEATING *NAUSEA AND VOMITING THAT IS NOT CONTROLLED WITH YOUR NAUSEA MEDICATION *UNUSUAL SHORTNESS OF BREATH *UNUSUAL BRUISING OR BLEEDING *URINARY PROBLEMS (pain or burning when urinating, or frequent urination) *BOWEL PROBLEMS (unusual diarrhea, constipation, pain near the anus) TENDERNESS IN MOUTH AND THROAT WITH OR WITHOUT PRESENCE OF ULCERS (sore throat, sores in mouth, or  a toothache) UNUSUAL RASH, SWELLING OR PAIN  UNUSUAL VAGINAL DISCHARGE OR ITCHING   Items with * indicate a potential emergency and should be followed up as soon as possible or go to the Emergency Department if any problems should occur.  Please show the CHEMOTHERAPY ALERT CARD or IMMUNOTHERAPY ALERT CARD at check-in to the Emergency Department and triage nurse.  Should you have questions after your visit or need to cancel or reschedule your appointment, please contact [facility]  [phone] and follow the prompts.  Office hours are 8:00 a.m. to 4:30 p.m. Monday - Friday. Please note that voicemails left after 4:00 p.m. may not be returned until the following business day.  We are closed weekends and major holidays. You have access to a nurse at all times for urgent questions. Please call the main number to the clinic [phone] and follow the prompts.  For any non-urgent questions, you may also contact your provider using MyChart. We now offer e-Visits for anyone 67 and older to request care online for non-urgent symptoms. For details visit [email or web address].   Also download the MyChart app! Go to the app store, search "MyChart", open the app, select [facility], and log in with your MyChart username and  password.  Due to Covid, a mask is required upon entering the hospital/clinic. If you do not have a mask, one will be given to you upon arrival. For doctor visits, patients may have 1 support person aged 74 or older with them. For treatment visits, patients cannot have anyone with them due to current Covid guidelines and our immunocompromised population.

## 2021-06-01 NOTE — Addendum Note (Signed)
Addended by: Drue Dun on: 06/01/2021 09:23 AM   Modules accepted: Orders

## 2021-06-01 NOTE — Assessment & Plan Note (Addendum)
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  Also new lesion in the right hepatic lobe concerning for metastasis.  Faint nodularity of omentum/peritoneal disease; periportal adenopathy increased.  Rising tumor marker; CA 19-9- rising; currently on 5-FU CVI+ Onivyde.  I reviewed with the patient the rising cancer marker.  #Proceed with cycle #2 chemotherapy today. Labs today reviewed;  acceptable for treatment today.   # Diarrhea- G-1; imodium/ Lomotil- improved.   # Anemia sec to chemo-hemoglobin today is 8.3; hold off PRBC transfusion.  Monitor closely. Monitor closely.   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 On tramadol 1-2 a day prn.   # Blood glucose-s/p P-[id number] [HbA1c- May 5.5; continue glipizide 5 mg BID- STABLE.   # HTN-  continue with cozaar- STABLE.  # Reflux/burping PPI-once a day; in AM.- STABLE.  # Hypokalemia- 3.8- sec to cisplatin- improved; continue Kdur 20 meQ once a day- STABLE.   # DISPOSITION: 3w-TG # chemo today- 5FU+ Onivide # Follow up in 3 weeks- MD; labs- cbc/cmp/ca-19-9; %FU+onivide; chemo- pump off 48 hours later-Dr.B

## 2021-06-01 NOTE — Progress Notes (Signed)
[facility] CONSULT NOTE  Patient Care Team: Revelo, Elyse Jarvis, MD as PCP - General (Family Medicine) Rico Junker, RN as Registered Nurse Christene Lye, MD (General Surgery) Clent Jacks, RN as Oncology Nurse Navigator  CHIEF COMPLAINTS/PURPOSE OF CONSULTATION: GALLBLADDER CANCER   #  Oncology History Overview Note  10/25- GALLBLADDER CANCER- [Dr.Cannon]/Dr.Byerly- laproscopic cholecystectomy- pT Category: pT3; pN1' POSITIVE for PNI; Histologic Type: Adenocarcinoma, biliary type  Histologic Grade: G3, poorly differentiated  Tumor Size: Greatest dimension: 2.2 cm  Tumor Extent: Tumor directly invades the liver  Lymphovascular Invasion: Present pre-treatement   Tumor Site: Fundus MARGINS  Margin Status for Invasive Carcinoma:      Margin Involved by Invasive Carcinoma: Liver parenchymal / hepatic  Bed; Number of Lymph Nodes with Tumor: 1       Number of Lymph Nodes Examined: 1    # October 25th 2021- Incidental gallbladder ADENOCA-s/p acute cholecystectomy [Dr.Cannon]. Stage III- T3N1; liver margin positive.  CT chest negative for any metastatic disease.  MRI abdomen with and without contrast-residual disease noted gallbladder bed; no evidence of distant metastases or lymphadenopathy. NOv 3rd 2021-CA 19-9 > 2000;     # NEOADJUVANT CHEMO-12/15-  Gem-cis [d1 & d-8 q 21 days] ; DEC 2021- Ca-[id number].   June 14th, 2022- Dr.Byerly- METASTATIC DISEASE [. Multiple firm nodules were identified adherent to the anterior abdominal wall in both the RUQ as well as periumbilical area. These were primarily in the omentum, though one was between the liver and the abdominal wall.  Several of these nodules were excised and sent to pathology for frozen section, with preliminary pathology results consistent with adenocarcinoma]  # July 6th- Gem-Cis- Durva q21 days  9/21- cycle6day#1-decrease dose of gemcitabine to 80 mg meter square; cisplatin to 20 mg of  metered square; same dose of Durva; day 8-no changes; on pro.   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  Kristina Orozco 65 y.o.  female stage IV adenocarcinoma gallbladder currently on palliative chemotherapy with 5FU+Onivyde.  Patient tolerated cycle #1 chemotherapy fairly well.  Had 1 episode of diarrhea-improved after taking Imodium/Lomotil.  Patient is currently taking tramadol 1 to 2 pills a day for pain. No chest pain.  No cough.  Review of Systems  Constitutional:  Positive for malaise/fatigue. Negative for chills, diaphoresis and fever.  HENT:  Negative for nosebleeds and sore throat.   Eyes:  Negative for double vision.  Respiratory:  Negative for cough, hemoptysis, sputum production, shortness of breath and wheezing.   Cardiovascular:  Negative for chest pain, palpitations, orthopnea and leg swelling.  Gastrointestinal:  Positive for abdominal pain. Negative for blood in stool, diarrhea, heartburn, melena,  nausea and vomiting.  Genitourinary:  Negative for dysuria, frequency and urgency.  Musculoskeletal:  Negative for back pain and joint pain.  Skin: Negative.  Negative for itching and rash.  Neurological:  Negative for  dizziness, tingling, focal weakness, weakness and headaches.  Endo/Heme/Allergies:  Does not bruise/bleed easily.  Psychiatric/Behavioral:  Negative for depression. The patient is not nervous/anxious and does not have insomnia.  +   MEDICAL HISTORY:  Past Medical History:  Diagnosis Date   Cancer ([place]) 05/17/2020   gallbladder   Diabetes mellitus without complication ([place])    History of chemotherapy    started 06/2020   Hypertension     SURGICAL HISTORY: Past Surgical History:  Procedure Laterality Date   ABDOMINAL HYSTERECTOMY     APPENDECTOMY  2004   BREAST BIOPSY Left 2011   BREAST BIOPSY Right 08/26/13   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/28/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   CHOLECYSTECTOMY N/A 05/17/2020   Procedure: LAPAROSCOPIC CHOLECYSTECTOMY;  Surgeon: Fredirick Maudlin, MD;  Location: [facility] ORS;  Service: General;  Laterality: N/A;   COLONOSCOPY WITH PROPOFOL N/A 01/31/2018   Procedure: COLONOSCOPY WITH PROPOFOL;  Surgeon: Jonathon Bellows, MD;  Location: [facility] ENDOSCOPY;  Service: Gastroenterology;  Laterality: N/A;   LAPAROSCOPIC LYSIS OF ADHESIONS  01/04/2021   Procedure: LAPAROSCOPIC LYSIS OF ADHESIONS;  Surgeon: Stark Klein, MD;  Location: [facility];  Service: General;;  1 HOUR   LAPAROSCOPY N/A 01/04/2021   Procedure: LAPAROSCOPY DIAGNOSTIC;  Surgeon: Stark Klein, MD;  Location: [facility];  Service: General;  Laterality: N/A;  EPIDURAL   PORTACATH PLACEMENT Right 07/02/2020   Procedure: INSERTION PORT-A-CATH, chemotherapy;  Surgeon: Fredirick Maudlin, MD;  Location: [facility] ORS;  Service: General;  Laterality: Right;    SOCIAL HISTORY: Social History   Socioeconomic History   Marital status: Single    Spouse name: Not on file   Number of children: Not on file   Years of education: Not on file   Highest education level: Not on file  Occupational History   Occupation: special needs worker  Tobacco Use   Smoking status: Never   Smokeless tobacco: Never  Vaping Use   Vaping Use: Never used   Substance and Sexual Activity   Alcohol use: Yes    Alcohol/week: 2.0 standard drinks    Types: 2 Cans of beer per week    Comment: beer on weekend   Drug use: No   Sexual activity: Not Currently  Other Topics Concern   Not on file  Social History Narrative   Lives in [place]; with son. 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 Never smoked; beer/iqour over weekends.    Social Determinants of Health   Financial Resource Strain: Not on file  Food Insecurity: Not on file  Transportation Needs: Not on file  Physical Activity: Not on file  Stress: Not on file  Social Connections: Not on file  Intimate Partner Violence: Not on file    FAMILY HISTORY: Family History  Problem Relation Age of Onset   Pancreatic cancer Mother        AT 53 years   Breast cancer Sister        appx-55 years   Pancreatic cancer Maternal Grandmother     ALLERGIES:  is allergic to seasonal ic [cholestatin].  MEDICATIONS:  Current Outpatient Medications  Medication Sig Dispense Refill   Chlorpheniramine Maleate (ALLERGY RELIEF PO) Take 1 tablet by mouth daily as needed (allergies).     CINNAMON PO Take 1 tablet by mouth daily.  diphenoxylate-atropine (LOMOTIL) 2.5-0.025 MG tablet Take 1 tablet by mouth 4 (four) times daily as needed for diarrhea or loose stools. Take it along with immodium 60 tablet 0   GARLIC PO Take 1 tablet by mouth daily.     lidocaine-prilocaine (EMLA) cream Apply 1 application topically as needed. Apply on the port 30-45 mins prior to port access 30 g 1   losartan (COZAAR) 25 MG tablet Take 25 mg by mouth every morning.      Multiple Vitamins-Minerals (MULTIVITAMIN WITH MINERALS) tablet Take 1 tablet by mouth daily.     ondansetron (ZOFRAN) 8 MG tablet One pill every 8 hours as needed for nausea/vomitting. 40 tablet 1   oxyCODONE (OXY IR/ROXICODONE) 5 MG immediate release tablet Take 1-2 tablets (5-10 mg total) by mouth every 6 (six) hours as needed for  severe pain. 60 tablet 0   potassium chloride SA (KLOR-CON) 20 MEQ tablet 1 pill twice a day 60 tablet 3   pravastatin (PRAVACHOL) 10 MG tablet Take 10 mg by mouth 4 (four) times a week.     prochlorperazine (COMPAZINE) 10 MG tablet Take 1 tablet (10 mg total) by mouth every 6 (six) hours as needed for nausea or vomiting. 40 tablet 1   traMADol (ULTRAM) 50 MG tablet Take 1 tablet (50 mg total) by mouth every 8 (eight) hours as needed. Watch out dizziness after taking the pills. Caution with driving 45 tablet 0   glipiZIDE (GLUCOTROL) 5 MG tablet Take 1 tablet (5 mg total) by mouth 2 (two) times daily before a meal. 60 tablet 6   No current facility-administered medications for this visit.   Facility-Administered Medications Ordered in Other Visits  Medication Dose Route Frequency Provider Last Rate Last Admin   heparin lock flush 100 UNIT/ML injection            heparin lock flush 100 unit/mL  500 Units Intravenous Once Charlaine Dalton R, MD       heparin lock flush 100 unit/mL  500 Units Intravenous Once Charlaine Dalton R, MD       sodium chloride flush (NS) 0.9 % injection 10 mL  10 mL Intravenous PRN Charlaine Dalton R, MD   10 mL at 04/20/21 0825   sodium chloride flush (NS) 0.9 % injection 10 mL  10 mL Intravenous PRN Cammie Sickle, MD   10 mL at 06/01/21 0824      .  PHYSICAL EXAMINATION: ECOG PERFORMANCE STATUS: 0 - Asymptomatic  Vitals:   06/01/21 0841  BP: 126/64  Pulse: 98  Resp: 20  Temp: 99.2 F (37.3 C)  SpO2: 100%    Filed Weights   06/01/21 0841  Weight: 211 lb (95.7 kg)     Physical Exam HENT:     Head: Normocephalic and atraumatic.     Mouth/Throat:     Pharynx: No oropharyngeal exudate.  Eyes:     Pupils: Pupils are equal, round, and reactive to light.  Cardiovascular:     Rate and Rhythm: Normal rate and regular rhythm.  Pulmonary:     Effort: Pulmonary effort is normal. No respiratory distress.     Breath sounds: Normal breath  sounds. No wheezing.  Abdominal:     General: Bowel sounds are normal. There is distension.     Palpations: Abdomen is soft. There is no mass.     Tenderness: There is no abdominal tenderness. There is no guarding or rebound.  Musculoskeletal:        General: No tenderness.  Normal range of motion.     Cervical back: Normal range of motion and neck supple.  Skin:    General: Skin is warm.  Neurological:     Mental Status: She is alert and oriented to person, place, and time.  Psychiatric:        Mood and Affect: Affect normal.     LABORATORY DATA:  I have reviewed the data as listed Lab Results  Component Value Date   WBC 8.6 06/01/2021   HGB 8.4 (L) 06/01/2021   HCT 26.7 (L) 06/01/2021   MCV 90.2 06/01/2021   PLT 253 06/01/2021   Recent Labs    05/04/21 0820 05/18/21 0825 05/25/21 0807 06/01/21 0824  NA 137 138 136 135  K 3.8 4.0 3.6 3.8  CL 104 104 100 101  CO2 '26 25 25 25  ' GLUCOSE 152* 98 125* 137*  BUN '14 17 17 17  ' CREATININE 0.86 0.93 1.02* 1.01*  CALCIUM 8.6* 8.9 8.7* 8.8*  GFRNONAA >60 >60 >60 >60  PROT 6.9 7.5  --  7.3  ALBUMIN 3.7 4.0  --  3.8  AST 19 23  --  29  ALT 12 16  --  17  ALKPHOS 80 79  --  82  BILITOT <0.1* 0.5  --  0.5    RADIOGRAPHIC STUDIES: I have personally reviewed the radiological images as listed and agreed with the findings in the report. CT CHEST ABDOMEN PELVIS W CONTRAST  Result Date: 05/04/2021 CLINICAL DATA:  Restaging gallbladder cancer. Current chemotherapy and immunotherapy. Pain in the right upper quadrant. EXAM: CT CHEST, ABDOMEN, AND PELVIS WITH CONTRAST TECHNIQUE: Multidetector CT imaging of the chest, abdomen and pelvis was performed following the standard protocol during bolus administration of intravenous contrast. CONTRAST:  173m OMNIPAQUE IOHEXOL 350 MG/ML SOLN COMPARISON:  Multiple exams, including abdominal MR from 11/03/2020 and chest CT from 05/31/2020 FINDINGS: CT CHEST FINDINGS Cardiovascular: Right Port-A-Cath  tip: Upper IVC near the inferior cavoatrial junction. Coronary, aortic arch, and branch vessel atherosclerotic vascular disease. Mild cardiomegaly. Mediastinum/Nodes: Right paratracheal node 0.9 cm in short axis on image 18 series 2, previously 1.1 cm. Adjacent lower right paratracheal lymph node measures 1.2 cm in short axis on image 20 series 2 (previously 1.3 cm) but has a fatty hilum. Small type 1 hiatal hernia. Lungs/Pleura: Trace right pleural effusion. Stable 4 by 3 mm subpleural nodule in the right upper lobe along the minor fissure on image 62 series 3, probably a subpleural lymph node. A separate 3 mm right middle lobe subpleural nodule is present along the major fissure on image 74 series 3, image crease conspicuity compared to prior chest CT. A 0.6 by 0.4 cm right middle lobe subpleural nodule on image 82 of series 3 is newly appreciable but may have been previously obscured by prior atelectasis in the right middle lobe. Musculoskeletal: Degenerative subcortical cyst or geode inferiorly in the right glenoid. Thoracic spondylosis. CT ABDOMEN PELVIS FINDINGS Hepatobiliary: New enhancing right hepatic lobe masses along the gallbladder fossa highly suspicious for malignancy. One of these measures 4.6 by 2.5 cm on image 61 series 2. Another adjacent lesion measures 2.0 by 1.4 cm on image 63 series 2. Stable nonspecific hypodense lesion posteriorly in the right hepatic lobe measuring 1.1 by 0.8 cm on image 72 series 2. Newly appreciable nonspecific hypodense 0.7 by 0.5 cm lesion in the dome of the right hepatic lobe on image 38 series 2. Pancreas: Unremarkable Spleen: Unremarkable Adrenals/Urinary Tract: Both adrenal glands appear normal. 2.0  by 1.4 cm Bosniak category 1 cyst of the left kidney lower pole. Stomach/Bowel: Scattered diverticula of the descending and proximal sigmoid colon. Vascular/Lymphatic: A porta hepatis/peripancreatic lymph node measures 1.7 cm in short axis on image 54 series 2, formerly  0.8 cm in short axis on 11/03/2020. Another porta hepatis lymph node measures 1.8 cm in short axis on image 59 series 2, formerly 1.1 cm. Mild atherosclerosis is present, including aortoiliac atherosclerotic disease. Reproductive: Uterus absent.  Adnexa unremarkable. Other: Subtle nodularity along the omentum particularly in the left upper quadrant for example on image 68 series 2, early peritoneal spread of tumor not excluded. No current ascites. Musculoskeletal: Small umbilical hernia contains adipose tissue. Left paraumbilical small defect along the rectus abdominus on image 80 series 2, possibly postoperative. IMPRESSION: 1. New masses in the right hepatic lobe adjacent to the gallbladder fossa, compatible with recurrent malignancy. There is also a new small hypodense lesion in the dome of the right hepatic lobe which is technically nonspecific. 2. Scattered faint nodularity along the omentum is nonspecific but could indicate early peritoneal spread of tumor. No overt ascites at this time. 3. Stable posteroinferior right hepatic lobe lesion. 4. Increasing porta hepatis adenopathy. Borderline enlarged paratracheal lymph nodes are slightly reduced in size from prior. 5. Small subpleural nodularity in the right middle lobe merits surveillance. 6. Other imaging findings of potential clinical significance: Aortic Atherosclerosis (ICD10-I70.0). Coronary atherosclerosis. Mild cardiomegaly. Small type 1 hiatal hernia. Trace right pleural effusion. Mild colonic diverticulosis. Small umbilical hernia and small left paraumbilical defect in the rectus abdominus muscle. Electronically Signed   By: Van Clines M.D.   On: 05/04/2021 07:59     ASSESSMENT & PLAN:   Primary adenocarcinoma of gall bladder [facility]) #Metastatic gallbladder adenocarcinoma-CT scan Oct 11th, 2022-shows progressive disease-new masses in the right hepatic lobe/adjacent to gallbladder with recurrent malignancy.  Also new lesion in the right  hepatic lobe concerning for metastasis.  Faint nodularity of omentum/peritoneal disease; periportal adenopathy increased.  Rising tumor marker; CA 19-9- rising; currently on 5-FU CVI+ Onivyde.  I reviewed with the patient the rising cancer marker.  #Proceed with cycle #2 chemotherapy today. Labs today reviewed;  acceptable for treatment today.   # Diarrhea- G-1; imodium/ Lomotil- improved.   # Anemia sec to chemo-hemoglobin today is 8.3; hold off PRBC transfusion.  Monitor closely. Monitor closely.   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 On tramadol 1-2 a day prn.   # Blood glucose-s/p P-BF-BG-156 [HbA1c- May 5.5; continue glipizide 5 mg BID- STABLE.   # HTN-  continue with cozaar- STABLE.  # Reflux/burping PPI-once a day; in AM.- STABLE.  # Hypokalemia- 3.8- sec to cisplatin- improved; continue Kdur 20 meQ once a day- STABLE.   # DISPOSITION: 3w-TG # chemo today- 5FU+ Onivide # Follow up in 3 weeks- MD; labs- cbc/cmp/ca-19-9; %FU+onivide; chemo- pump off 48 hours later-Dr.B   All questions were answered. The patient knows to call the clinic with any problems, questions or concerns.   Cammie Sickle, MD 06/01/2021 9:02 AM

## 2021-06-03 ENCOUNTER — Other Ambulatory Visit: Payer: Self-pay

## 2021-06-03 ENCOUNTER — Inpatient Hospital Stay: Payer: Medicare Other

## 2021-06-03 DIAGNOSIS — Z5111 Encounter for antineoplastic chemotherapy: Secondary | ICD-10-CM | POA: Diagnosis not present

## 2021-06-03 DIAGNOSIS — C23 Malignant neoplasm of gallbladder: Secondary | ICD-10-CM

## 2021-06-03 LAB — CA 19-9 (SERIAL): CA 19-9: 9639 U/mL — ABNORMAL HIGH (ref 0–35)

## 2021-06-03 MED ORDER — HEPARIN SOD (PORK) LOCK FLUSH 100 UNIT/ML IV SOLN
500.0000 [IU] | Freq: Once | INTRAVENOUS | Status: AC | PRN
  Administered 2021-06-03: 500 [IU]
  Filled 2021-06-03: qty 5

## 2021-06-03 MED ORDER — HEPARIN SOD (PORK) LOCK FLUSH 100 UNIT/ML IV SOLN
INTRAVENOUS | Status: AC
  Filled 2021-06-03: qty 5

## 2021-06-03 MED ORDER — SODIUM CHLORIDE 0.9% FLUSH
10.0000 mL | INTRAVENOUS | Status: DC | PRN
  Administered 2021-06-03: 10 mL
  Filled 2021-06-03: qty 10

## 2021-06-21 ENCOUNTER — Other Ambulatory Visit: Payer: Self-pay

## 2021-06-21 ENCOUNTER — Telehealth: Payer: Self-pay | Admitting: *Deleted

## 2021-06-21 ENCOUNTER — Other Ambulatory Visit: Payer: Self-pay | Admitting: Hospice and Palliative Medicine

## 2021-06-21 MED ORDER — OXYCODONE HCL 5 MG PO TABS: 5.0000 mg | ORAL_TABLET | Freq: Four times a day (QID) | ORAL | 0 refills | Status: DC | PRN

## 2021-06-21 NOTE — Telephone Encounter (Signed)
Per Josh, pt can increase tramadol to 2 tablets or we can send refill for oxycodone. Per pt, she would like a refill on the oxycodone. Rx pended.

## 2021-06-21 NOTE — Progress Notes (Signed)
Refill sent for oxycodone.  PDMP reviewed.

## 2021-06-21 NOTE — Telephone Encounter (Signed)
Patient called reporting that she was unable to sleep at all last night due to pain her side keeping her up. She thinks this is a side effects of some of the medications she is taking. She is asking for medicine to relieve pain. Please advise

## 2021-06-22 ENCOUNTER — Inpatient Hospital Stay: Payer: Medicare Other

## 2021-06-22 ENCOUNTER — Other Ambulatory Visit: Payer: Self-pay

## 2021-06-22 ENCOUNTER — Inpatient Hospital Stay (HOSPITAL_BASED_OUTPATIENT_CLINIC_OR_DEPARTMENT_OTHER): Payer: Medicare Other | Admitting: Internal Medicine

## 2021-06-22 VITALS — HR 96

## 2021-06-22 DIAGNOSIS — C23 Malignant neoplasm of gallbladder: Secondary | ICD-10-CM

## 2021-06-22 DIAGNOSIS — Z5111 Encounter for antineoplastic chemotherapy: Secondary | ICD-10-CM | POA: Diagnosis not present

## 2021-06-22 LAB — COMPREHENSIVE METABOLIC PANEL
ALT: 21 U/L (ref 0–44)
AST: 36 U/L (ref 15–41)
Albumin: 3.6 g/dL (ref 3.5–5.0)
Alkaline Phosphatase: 116 U/L (ref 38–126)
Anion gap: 9 (ref 5–15)
BUN: 15 mg/dL (ref 8–23)
CO2: 24 mmol/L (ref 22–32)
Calcium: 8.4 mg/dL — ABNORMAL LOW (ref 8.9–10.3)
Chloride: 101 mmol/L (ref 98–111)
Creatinine, Ser: 1.1 mg/dL — ABNORMAL HIGH (ref 0.44–1.00)
GFR, Estimated: 55 mL/min — ABNORMAL LOW (ref >60)
Glucose, Bld: 105 mg/dL — ABNORMAL HIGH (ref 70–99)
Potassium: 4.6 mmol/L (ref 3.5–5.1)
Sodium: 134 mmol/L — ABNORMAL LOW (ref 135–145)
Total Bilirubin: 0.4 mg/dL (ref 0.3–1.2)
Total Protein: 7.2 g/dL (ref 6.5–8.1)

## 2021-06-22 LAB — CBC WITH DIFFERENTIAL/PLATELET
Abs Immature Granulocytes: 0.05 10*3/uL (ref 0.00–0.07)
Basophils Absolute: 0 10*3/uL (ref 0.0–0.1)
Basophils Relative: 0 %
Eosinophils Absolute: 0.3 10*3/uL (ref 0.0–0.5)
Eosinophils Relative: 3 %
HCT: 27.9 % — ABNORMAL LOW (ref 36.0–46.0)
Hemoglobin: 8.4 g/dL — ABNORMAL LOW (ref 12.0–15.0)
Immature Granulocytes: 1 %
Lymphocytes Relative: 13 %
Lymphs Abs: 1.4 10*3/uL (ref 0.7–4.0)
MCH: 25.8 pg — ABNORMAL LOW (ref 26.0–34.0)
MCHC: 30.1 g/dL (ref 30.0–36.0)
MCV: 85.8 fL (ref 80.0–100.0)
Monocytes Absolute: 1.9 10*3/uL — ABNORMAL HIGH (ref 0.1–1.0)
Monocytes Relative: 18 %
Neutro Abs: 6.7 10*3/uL (ref 1.7–7.7)
Neutrophils Relative %: 65 %
Platelets: 234 10*3/uL (ref 150–400)
RBC: 3.25 MIL/uL — ABNORMAL LOW (ref 3.87–5.11)
RDW: 19.5 % — ABNORMAL HIGH (ref 11.5–15.5)
WBC: 10.4 10*3/uL (ref 4.0–10.5)
nRBC: 0 % (ref 0.0–0.2)

## 2021-06-22 LAB — FERRITIN: Ferritin: 1102 ng/mL — ABNORMAL HIGH (ref 11–307)

## 2021-06-22 LAB — IRON AND TIBC
Iron: 17 ug/dL — ABNORMAL LOW (ref 28–170)
Saturation Ratios: 8 % — ABNORMAL LOW (ref 10.4–31.8)
TIBC: 214 ug/dL — ABNORMAL LOW (ref 250–450)
UIBC: 197 ug/dL

## 2021-06-22 LAB — MAGNESIUM: Magnesium: 2 mg/dL (ref 1.7–2.4)

## 2021-06-22 MED ORDER — SODIUM CHLORIDE 0.9 % IV SOLN
850.0000 mg | Freq: Once | INTRAVENOUS | Status: AC
  Administered 2021-06-22: 850 mg via INTRAVENOUS
  Filled 2021-06-22: qty 42.5

## 2021-06-22 MED ORDER — SODIUM CHLORIDE 0.9 % IV SOLN
Freq: Once | INTRAVENOUS | Status: AC
  Filled 2021-06-22: qty 250

## 2021-06-22 MED ORDER — SODIUM CHLORIDE 0.9 % IV SOLN
10.0000 mg | Freq: Once | INTRAVENOUS | Status: AC
  Administered 2021-06-22: 10 mg via INTRAVENOUS
  Filled 2021-06-22: qty 10

## 2021-06-22 MED ORDER — SODIUM CHLORIDE 0.9 % IV SOLN
5000.0000 mg | INTRAVENOUS | Status: DC
  Administered 2021-06-22: 5000 mg via INTRAVENOUS
  Filled 2021-06-22: qty 100

## 2021-06-22 MED ORDER — PALONOSETRON HCL INJECTION 0.25 MG/5ML
0.2500 mg | Freq: Once | INTRAVENOUS | Status: AC
  Administered 2021-06-22: 0.25 mg via INTRAVENOUS
  Filled 2021-06-22: qty 5

## 2021-06-22 MED ORDER — SODIUM CHLORIDE 0.9 % IV SOLN
150.0000 mg | Freq: Once | INTRAVENOUS | Status: AC
  Administered 2021-06-22: 150 mg via INTRAVENOUS
  Filled 2021-06-22: qty 34.88

## 2021-06-22 MED ORDER — DIPHENOXYLATE-ATROPINE 2.5-0.025 MG PO TABS
1.0000 | ORAL_TABLET | Freq: Once | ORAL | Status: AC
  Administered 2021-06-22: 1 via ORAL
  Filled 2021-06-22: qty 1

## 2021-06-22 NOTE — Assessment & Plan Note (Addendum)
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  Also new lesion in the right hepatic lobe concerning for metastasis.  Faint nodularity of omentum/peritoneal disease; periportal adenopathy increased.  Rising tumor marker; CA 19-9- rising; currently on 5-FU CVI+ Onivyde.  I again reviewed with the patient the rising cancer marker ~NOV 2022- 9000+.  #Proceed with cycle #3 chemotherapy today. Labs today reviewed;  acceptable for treatment today. Repeat scan in JAN 2023.   # Diarrhea- G-1; imodium/ Lomotil- improved.   # Anemia sec to chemo-hemoglobin today is 8.3; hold off PRBC transfusion.  Monitor closely. Monitor closely. STABLE; check iron studies/ferritin.   # Abdominal distention/abdominal pain- likely malignant ascites; s/p US/paracentsis- worse- improved on oxycdone.   # Blood glucose-s/p P-BF-BG-105  [HbA1c- May 5.5; continue glipizide 5 mg BID- STABLE.   # HTN-  continue with cozaar- STABLE.  # Hypokalemia- 3.8- sec to cisplatin- improved; continue Kdur 20 meQ once a day- STABLE.   # DISPOSITION:  # chemo today- 5FU+ Onivide # Follow up in 2 weeks- NP; labs- cbc/cmp/ca-19-9; %FU+onivide; chemo- pump off 48 hours later  # Follow up in 4 weeks- MD; labs- cbc/cmp/ca-19-9; %FU+onivide; chemo- pump off 48 hours later-Dr.B

## 2021-06-22 NOTE — Patient Instructions (Signed)
[facility]  Discharge Instructions: Thank you for choosing [facility] to provide your oncology and hematology care.  If you have a lab appointment with the [facility], please go directly to the [facility] and check in at the registration area.  Wear comfortable clothing and clothing appropriate for easy access to any Portacath or PICC line.   We strive to give you quality time with your provider. You may need to reschedule your appointment if you arrive late (15 or more minutes).  Arriving late affects you and other patients whose appointments are after yours.  Also, if you miss three or more appointments without notifying the office, you may be dismissed from the clinic at the provider's discretion.      For prescription refill requests, have your pharmacy contact our office and allow 72 hours for refills to be completed.    Today you received the following chemotherapy and/or immunotherapy agents Leucovorin, 5 fu, Onivide      To help prevent nausea and vomiting after your treatment, we encourage you to take your nausea medication as directed.  BELOW ARE SYMPTOMS THAT SHOULD BE REPORTED IMMEDIATELY: *FEVER GREATER THAN 100.4 F (38 C) OR HIGHER *CHILLS OR SWEATING *NAUSEA AND VOMITING THAT IS NOT CONTROLLED WITH YOUR NAUSEA MEDICATION *UNUSUAL SHORTNESS OF BREATH *UNUSUAL BRUISING OR BLEEDING *URINARY PROBLEMS (pain or burning when urinating, or frequent urination) *BOWEL PROBLEMS (unusual diarrhea, constipation, pain near the anus) TENDERNESS IN MOUTH AND THROAT WITH OR WITHOUT PRESENCE OF ULCERS (sore throat, sores in mouth, or a toothache) UNUSUAL RASH, SWELLING OR PAIN  UNUSUAL VAGINAL DISCHARGE OR ITCHING   Items with * indicate a potential emergency and should be followed up as soon as possible or go to the Emergency Department if any problems should occur.  Please show the CHEMOTHERAPY ALERT CARD or IMMUNOTHERAPY ALERT  CARD at check-in to the Emergency Department and triage nurse.  Should you have questions after your visit or need to cancel or reschedule your appointment, please contact [facility]  [phone] and follow the prompts.  Office hours are 8:00 a.m. to 4:30 p.m. Monday - Friday. Please note that voicemails left after 4:00 p.m. may not be returned until the following business day.  We are closed weekends and major holidays. You have access to a nurse at all times for urgent questions. Please call the main number to the clinic [phone] and follow the prompts.  For any non-urgent questions, you may also contact your provider using MyChart. We now offer e-Visits for anyone 41 and older to request care online for non-urgent symptoms. For details visit [email or web address].   Also download the MyChart app! Go to the app store, search "MyChart", open the app, select [facility], and log in with your MyChart username and password.  Due to Covid, a mask is required upon entering the hospital/clinic. If you do not have a mask, one will be given to you upon arrival. For doctor visits, patients may have 1 support person aged 76 or older with them. For treatment visits, patients cannot have anyone with them due to current Covid guidelines and our immunocompromised population.   Leucovorin injection What is this medication? LEUCOVORIN (loo koe VOR in) is used to prevent or treat the harmful effects of some medicines. This medicine is used to treat anemia caused by a low amount of folic acid in the body. It is also used with 5-fluorouracil (5-FU) to treat colon cancer.  This medicine may be used for other purposes; ask your health care provider or pharmacist if you have questions. What should I tell my care team before I take this medication? They need to know if you have any of these conditions: anemia from low levels of vitamin B-12 in the blood an unusual or allergic  reaction to leucovorin, folic acid, other medicines, foods, dyes, or preservatives pregnant or trying to get pregnant breast-feeding How should I use this medication? This medicine is for injection into a muscle or into a vein. It is given by a health care professional in a hospital or clinic setting. Talk to your pediatrician regarding the use of this medicine in children. Special care may be needed. Overdosage: If you think you have taken too much of this medicine contact a poison control center or emergency room at once. NOTE: This medicine is only for you. Do not share this medicine with others. What if I miss a dose? This does not apply. What may interact with this medication? capecitabine fluorouracil phenobarbital phenytoin primidone trimethoprim-sulfamethoxazole This list may not describe all possible interactions. Give your health care provider a list of all the medicines, herbs, non-prescription drugs, or dietary supplements you use. Also tell them if you smoke, drink alcohol, or use illegal drugs. Some items may interact with your medicine. What should I watch for while using this medication? Your condition will be monitored carefully while you are receiving this medicine. This medicine may increase the side effects of 5-fluorouracil, 5-FU. Tell your doctor or health care professional if you have diarrhea or mouth sores that do not get better or that get worse. What side effects may I notice from receiving this medication? Side effects that you should report to your doctor or health care professional as soon as possible: allergic reactions like skin rash, itching or hives, swelling of the face, lips, or tongue breathing problems fever, infection mouth sores unusual bleeding or bruising unusually weak or tired Side effects that usually do not require medical attention (report to your doctor or health care professional if they continue or are bothersome): constipation or  diarrhea loss of appetite nausea, vomiting This list may not describe all possible side effects. Call your doctor for medical advice about side effects. You may report side effects to FDA at 1-800-FDA-1088. Where should I keep my medication? This drug is given in a hospital or clinic and will not be stored at home. NOTE: This sheet is a summary. It may not cover all possible information. If you have questions about this medicine, talk to your doctor, pharmacist, or health care provider.  2022 Elsevier/Gold Standard (2008-01-16 00:00:00)  Fluorouracil, 5-FU injection What is this medication? FLUOROURACIL, 5-FU (flure oh YOOR a sil) is a chemotherapy drug. It slows the growth of cancer cells. This medicine is used to treat many types of cancer like breast cancer, colon or rectal cancer, pancreatic cancer, and stomach cancer. This medicine may be used for other purposes; ask your health care provider or pharmacist if you have questions. COMMON BRAND NAME(S): Adrucil What should I tell my care team before I take this medication? They need to know if you have any of these conditions: blood disorders dihydropyrimidine dehydrogenase (DPD) deficiency infection (especially a virus infection such as chickenpox, cold sores, or herpes) kidney disease liver disease malnourished, poor nutrition recent or ongoing radiation therapy an unusual or allergic reaction to fluorouracil, other chemotherapy, other medicines, foods, dyes, or preservatives pregnant or trying to get pregnant breast-feeding  How should I use this medication? This drug is given as an infusion or injection into a vein. It is administered in a hospital or clinic by a specially trained health care professional. Talk to your pediatrician regarding the use of this medicine in children. Special care may be needed. Overdosage: If you think you have taken too much of this medicine contact a poison control center or emergency room at  once. NOTE: This medicine is only for you. Do not share this medicine with others. What if I miss a dose? It is important not to miss your dose. Call your doctor or health care professional if you are unable to keep an appointment. What may interact with this medication? Do not take this medicine with any of the following medications: live virus vaccines This medicine may also interact with the following medications: medicines that treat or prevent blood clots like warfarin, enoxaparin, and dalteparin This list may not describe all possible interactions. Give your health care provider a list of all the medicines, herbs, non-prescription drugs, or dietary supplements you use. Also tell them if you smoke, drink alcohol, or use illegal drugs. Some items may interact with your medicine. What should I watch for while using this medication? Visit your doctor for checks on your progress. This drug may make you feel generally unwell. This is not uncommon, as chemotherapy can affect healthy cells as well as cancer cells. Report any side effects. Continue your course of treatment even though you feel ill unless your doctor tells you to stop. In some cases, you may be given additional medicines to help with side effects. Follow all directions for their use. Call your doctor or health care professional for advice if you get a fever, chills or sore throat, or other symptoms of a cold or flu. Do not treat yourself. This drug decreases your body's ability to fight infections. Try to avoid being around people who are sick. This medicine may increase your risk to bruise or bleed. Call your doctor or health care professional if you notice any unusual bleeding. Be careful brushing and flossing your teeth or using a toothpick because you may get an infection or bleed more easily. If you have any dental work done, tell your dentist you are receiving this medicine. Avoid taking products that contain aspirin,  acetaminophen, ibuprofen, naproxen, or ketoprofen unless instructed by your doctor. These medicines may hide a fever. Do not become pregnant while taking this medicine. Women should inform their doctor if they wish to become pregnant or think they might be pregnant. There is a potential for serious side effects to an unborn child. Talk to your health care professional or pharmacist for more information. Do not breast-feed an infant while taking this medicine. Men should inform their doctor if they wish to father a child. This medicine may lower sperm counts. Do not treat diarrhea with over the counter products. Contact your doctor if you have diarrhea that lasts more than 2 days or if it is severe and watery. This medicine can make you more sensitive to the sun. Keep out of the sun. If you cannot avoid being in the sun, wear protective clothing and use sunscreen. Do not use sun lamps or tanning beds/booths. What side effects may I notice from receiving this medication? Side effects that you should report to your doctor or health care professional as soon as possible: allergic reactions like skin rash, itching or hives, swelling of the face, lips, or tongue low blood counts -  this medicine may decrease the number of white blood cells, red blood cells and platelets. You may be at increased risk for infections and bleeding. signs of infection - fever or chills, cough, sore throat, pain or difficulty passing urine signs of decreased platelets or bleeding - bruising, pinpoint red spots on the skin, black, tarry stools, blood in the urine signs of decreased red blood cells - unusually weak or tired, fainting spells, lightheadedness breathing problems changes in vision chest pain mouth sores nausea and vomiting pain, swelling, redness at site where injected pain, tingling, numbness in the hands or feet redness, swelling, or sores on hands or feet stomach pain unusual bleeding Side effects that usually  do not require medical attention (report to your doctor or health care professional if they continue or are bothersome): changes in finger or toe nails diarrhea dry or itchy skin hair loss headache loss of appetite sensitivity of eyes to the light stomach upset unusually teary eyes This list may not describe all possible side effects. Call your doctor for medical advice about side effects. You may report side effects to FDA at 1-800-FDA-1088. Where should I keep my medication? This drug is given in a hospital or clinic and will not be stored at home. NOTE: This sheet is a summary. It may not cover all possible information. If you have questions about this medicine, talk to your doctor, pharmacist, or health care provider.  2022 Elsevier/Gold Standard (2021-03-29 00:00:00)  Irinotecan Liposomal injection What is this medication? IRINOTECAN LIPOSOME (eye ri noe TEE kan LIP oh som) is a chemotherapy drug. It is used to treat pancreatic cancer. This medicine may be used for other purposes; ask your health care provider or pharmacist if you have questions. COMMON BRAND NAME(S): ONIVYDE What should I tell my care team before I take this medication? They need to know if you have any of these conditions: bleeding disorders dehydration history of blood diseases, like sickle cell anemia or leukemia history of low levels of calcium, magnesium, or potassium in the blood infection (especially a virus infection such as chickenpox, cold sores, or herpes) liver disease low blood counts, like low white cell, platelet, or red cell counts lung or breathing disease, like asthma recent or ongoing radiation therapy an unusual or allergic reaction to irinotecan liposome, other medicines, foods, dyes, or preservatives pregnant or trying to get pregnant breast-feeding How should I use this medication? This drug is given as an infusion into a vein. It is administered in a hospital or clinic by a specially  trained health care professional. Talk to your pediatrician regarding the use of this medicine in children. Special care may be needed. Overdosage: If you think you have taken too much of this medicine contact a poison control center or emergency room at once. NOTE: This medicine is only for you. Do not share this medicine with others. What if I miss a dose? It is important not to miss your dose. Call your doctor or health care professional if you are unable to keep an appointment. What may interact with this medication? This medicine may interact with the following medications: antiviral medicines for HIV or AIDS certain medications for fungal infections like ketoconazole, itraconazole, voriconazole certain medications for seizures like carbamazepine, fosphenytoin, phenytoin clarithromycin gemfibrozil mephobarbital nefazodone phenobarbital primidone rifabutin rifampin rifapentine St. John's Wort telaprevir This list may not describe all possible interactions. Give your health care provider a list of all the medicines, herbs, non-prescription drugs, or dietary supplements you use. Also  tell them if you smoke, drink alcohol, or use illegal drugs. Some items may interact with your medicine. What should I watch for while using this medication? Check with your doctor or health care professional if you get an attack of severe diarrhea, nausea and vomiting, or if you sweat a lot. The loss of too much body fluid can make it dangerous for you to take this medicine. This medicine may interfere with the ability to have a child. You should talk with your doctor or health care professional if you are concerned about your fertility. Do not become pregnant while taking this medicine or for 1 month after the last dose; males with female partners should use condoms during treatment and for 4 months after the last dose. Women should inform their doctor if they wish to become pregnant or think they might be  pregnant. There is a potential for serious side effects to an unborn child. Talk to your health care professional or pharmacist for more information. Do not breast-feed an infant while taking this medicine or for 1 month after the last dose. Avoid taking products that contain aspirin, acetaminophen, ibuprofen, naproxen, or ketoprofen unless instructed by your doctor. These medicines may hide a fever. Be careful brushing and flossing your teeth or using a toothpick because you may get an infection or bleed more easily. If you have any dental work done, tell your dentist you are receiving this medicine. Call your doctor or health care professional for advice if you get a fever, chills or sore throat, or other symptoms of a cold or flu. Do not treat yourself. This drug decreases your body's ability to fight infections. Try to avoid being around people who are sick. This medicine may increase your risk to bruise or bleed. Call your doctor or health care professional if you notice any unusual bleeding. This drug may make you feel generally unwell. This is not uncommon, as chemotherapy can affect healthy cells as well as cancer cells. Report any side effects. Continue your course of treatment even though you feel ill unless your doctor tells you to stop. What side effects may I notice from receiving this medication? Side effects that you should report to your doctor or health care professional as soon as possible: allergic reactions like skin rash, itching or hives, swelling of the face, lips, or tongue breathing problems cough diarrhea low blood counts - this medicine may decrease the number of white blood cells, red blood cells and platelets. You may be at increased risk for infections and bleeding nausea, vomiting signs and symptoms of bleeding such as bloody or black, tarry stools; red or dark-brown urine; spitting up blood or brown material that looks like coffee grounds; red spots on the skin; unusual  bruising or bleeding from the eye, gums, or nose signs of decreased red blood cells - unusually weak or tired, feeling faint or lightheaded, falls signs and symptoms of infection like fever or chills; cough; sore throat; pain or trouble passing urine signs and symptoms of liver injury like dark yellow or brown urine; general ill feeling or flu-like symptoms; light-colored stools; loss of appetite; nausea; right upper belly pain; unusually weak or tired; yellowing of the eyes or skin stomach pain Side effects that usually do not require medical attention (report to your doctor or health care professional if they continue or are bothersome): hair loss loss of appetite mouth sores upset stomach This list may not describe all possible side effects. Call your doctor for medical  advice about side effects. You may report side effects to FDA at 1-800-FDA-1088. Where should I keep my medication? This drug is given in a hospital or clinic and will not be stored at home. NOTE: This sheet is a summary. It may not cover all possible information. If you have questions about this medicine, talk to your doctor, pharmacist, or health care provider.  2022 Elsevier/Gold Standard (2015-08-12 00:00:00)

## 2021-06-22 NOTE — Progress Notes (Signed)
Patient here for pretreatment check she reports that she did have N/V after last treatment but is better today. She is now taking pain medication.

## 2021-06-22 NOTE — Progress Notes (Signed)
[facility] CONSULT NOTE  Patient Care Team: Revelo, Elyse Jarvis, MD as PCP - General (Family Medicine) Rico Junker, RN as Registered Nurse Christene Lye, MD (General Surgery) Clent Jacks, RN as Oncology Nurse Navigator  CHIEF COMPLAINTS/PURPOSE OF CONSULTATION: GALLBLADDER CANCER   #  Oncology History Overview Note  10/25- GALLBLADDER CANCER- [Dr.Cannon]/Dr.Byerly- laproscopic cholecystectomy- pT Category: pT3; pN1' POSITIVE for PNI; Histologic Type: Adenocarcinoma, biliary type  Histologic Grade: G3, poorly differentiated  Tumor Size: Greatest dimension: 2.2 cm  Tumor Extent: Tumor directly invades the liver  Lymphovascular Invasion: Present pre-treatement   Tumor Site: Fundus MARGINS  Margin Status for Invasive Carcinoma:      Margin Involved by Invasive Carcinoma: Liver parenchymal / hepatic  Bed; Number of Lymph Nodes with Tumor: 1       Number of Lymph Nodes Examined: 1    # October 25th 2021- Incidental gallbladder ADENOCA-s/p acute cholecystectomy [Dr.Cannon]. Stage III- T3N1; liver margin positive.  CT chest negative for any metastatic disease.  MRI abdomen with and without contrast-residual disease noted gallbladder bed; no evidence of distant metastases or lymphadenopathy. NOv 3rd 2021-CA 19-9 > 2000;     # NEOADJUVANT CHEMO-12/15-  Gem-cis [d1 & d-8 q 21 days] ; DEC 2021- [id number].   June 14th, 2022- Dr.Byerly- METASTATIC DISEASE [. Multiple firm nodules were identified adherent to the anterior abdominal wall in both the RUQ as well as periumbilical area. These were primarily in the omentum, though one was between the liver and the abdominal wall.  Several of these nodules were excised and sent to pathology for frozen section, with preliminary pathology results consistent with adenocarcinoma]  # July 6th- Gem-Cis- Durva q21 days  9/21- cycle6day#1-decrease dose of gemcitabine to 80 mg meter square; cisplatin to 20 mg of  metered square; same dose of Durva; day 8-no changes; on pro.   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  [place] 65 y.o.  female stage IV adenocarcinoma gallbladder currently on palliative chemotherapy with 5FU+Onivyde.  Complains of abdominal pain- RUQ currently improved on Oxycodone.  Episode of diarrhea-improved after taking Imodium/Lomotil; poor taste. With nausea/ vomitting. Lost weight.  Review of Systems  Constitutional:  Positive for malaise/fatigue. Negative for chills, diaphoresis and fever.  HENT:  Negative for nosebleeds and sore throat.   Eyes:  Negative for double vision.  Respiratory:  Negative for cough, hemoptysis, sputum production, shortness of breath and wheezing.   Cardiovascular:  Negative for chest pain, palpitations, orthopnea and leg swelling.  Gastrointestinal:  Positive for abdominal pain. Negative for blood in stool, diarrhea, heartburn, melena, nausea and vomiting.  Genitourinary:  Negative for dysuria, frequency and urgency.  Musculoskeletal:  Negative for back pain and joint pain.  Skin: Negative.  Negative for itching and rash.  Neurological:  Negative for dizziness, tingling, focal weakness,  weakness and headaches.  Endo/Heme/Allergies:  Does not bruise/bleed easily.  Psychiatric/Behavioral:  Negative for depression. The patient is not nervous/anxious and does not have insomnia.  +   MEDICAL HISTORY:  Past Medical History:  Diagnosis Date   Cancer ([place]) 05/17/2020   gallbladder   Diabetes mellitus without complication ([place])    History of chemotherapy    started 06/2020   Hypertension     SURGICAL HISTORY: Past Surgical History:  Procedure Laterality Date   ABDOMINAL HYSTERECTOMY     APPENDECTOMY  2004   BREAST BIOPSY Left 2011   BREAST BIOPSY Right 08/26/13   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/28/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   CHOLECYSTECTOMY N/A 05/17/2020   Procedure: LAPAROSCOPIC CHOLECYSTECTOMY;  Surgeon: Fredirick Maudlin, MD;  Location: [facility] ORS;  Service: General;  Laterality: N/A;   COLONOSCOPY WITH PROPOFOL N/A 01/31/2018   Procedure: COLONOSCOPY WITH PROPOFOL;  Surgeon: Jonathon Bellows, MD;  [facility] ENDOSCOPY;  Service: Gastroenterology;  Laterality: N/A;   LAPAROSCOPIC LYSIS OF ADHESIONS  01/04/2021   Procedure: LAPAROSCOPIC LYSIS OF ADHESIONS;  Surgeon: Stark Klein, MD;  Location: [facility];  Service: General;;  1 HOUR   LAPAROSCOPY N/A 01/04/2021   Procedure: LAPAROSCOPY DIAGNOSTIC;  Surgeon: Stark Klein, MD;  Location: [place];  Service: General;  Laterality: N/A;  EPIDURAL   PORTACATH PLACEMENT Right 07/02/2020   Procedure: INSERTION PORT-A-CATH, chemotherapy;  Surgeon: Fredirick Maudlin, MD;  Location: [facility] ORS;  Service: General;  Laterality: Right;    SOCIAL HISTORY: Social History   Socioeconomic History   Marital status: Single    Spouse name: Not on file   Number of children: Not on file   Years of education: Not on file   Highest education level: Not on file  Occupational History   Occupation: special needs worker  Tobacco Use   Smoking status: Never   Smokeless tobacco: Never  Vaping Use   Vaping Use: Never used  Substance and Sexual Activity   Alcohol  use: Yes    Alcohol/week: 2.0 standard drinks    Types: 2 Cans of beer per week    Comment: beer on weekend   Drug use: No   Sexual activity: Not Currently  Other Topics Concern   Not on file  Social History Narrative   Lives in [place]; with son. 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 Never smoked; beer/iqour over weekends.    Social Determinants of Health   Financial Resource Strain: Not on file  Food Insecurity: Not on file  Transportation Needs: Not on file  Physical Activity: Not on file  Stress: Not on file  Social Connections: Not on file  Intimate Partner Violence: Not on file    FAMILY HISTORY: Family History  Problem Relation Age of Onset   Pancreatic cancer Mother        AT 58 years   Breast cancer Sister        appx-55 years   Pancreatic cancer Maternal Grandmother     ALLERGIES:  is allergic to seasonal ic [cholestatin].  MEDICATIONS:  Current Outpatient Medications  Medication Sig Dispense Refill   Chlorpheniramine Maleate (ALLERGY RELIEF PO) Take 1 tablet by mouth daily as needed (allergies).     CINNAMON PO Take 1 tablet by mouth daily.     diphenoxylate-atropine (LOMOTIL) 2.5-0.025 MG tablet Take 1 tablet by mouth 4 (  four) times daily as needed for diarrhea or loose stools. Take it along with immodium 60 tablet 0   GARLIC PO Take 1 tablet by mouth daily.     glipiZIDE (GLUCOTROL) 5 MG tablet Take 1 tablet (5 mg total) by mouth 2 (two) times daily before a meal. 60 tablet 6   lidocaine-prilocaine (EMLA) cream Apply 1 application topically as needed. Apply on the port 30-45 mins prior to port access 30 g 1   losartan (COZAAR) 25 MG tablet Take 25 mg by mouth every morning.      Multiple Vitamins-Minerals (MULTIVITAMIN WITH MINERALS) tablet Take 1 tablet by mouth daily.     ondansetron (ZOFRAN) 8 MG tablet One pill every 8 hours as needed for nausea/vomitting. 40 tablet 1   oxyCODONE (OXY IR/ROXICODONE) 5 MG immediate release tablet  Take 1-2 tablets (5-10 mg total) by mouth every 6 (six) hours as needed for severe pain. 60 tablet 0   potassium chloride SA (KLOR-CON) 20 MEQ tablet 1 pill twice a day 60 tablet 3   pravastatin (PRAVACHOL) 10 MG tablet Take 10 mg by mouth 4 (four) times a week.     prochlorperazine (COMPAZINE) 10 MG tablet Take 1 tablet (10 mg total) by mouth every 6 (six) hours as needed for nausea or vomiting. 40 tablet 1   traMADol (ULTRAM) 50 MG tablet Take 1 tablet (50 mg total) by mouth every 8 (eight) hours as needed. Watch out dizziness after taking the pills. Caution with driving 45 tablet 0   No current facility-administered medications for this visit.   Facility-Administered Medications Ordered in Other Visits  Medication Dose Route Frequency Provider Last Rate Last Admin   fluorouracil (ADRUCIL) 5,000 mg in sodium chloride 0.9 % 150 mL chemo infusion  5,000 mg Intravenous 1 day or 1 dose Charlaine Dalton R, MD       heparin lock flush 100 UNIT/ML injection            heparin lock flush 100 unit/mL  500 Units Intravenous Once Cammie Sickle, MD       irinotecan LIPOSOME (ONIVYDE) 150 mg in sodium chloride 0.9 % 500 mL chemo infusion  150 mg Intravenous Once Cammie Sickle, MD 357 mL/hr at 06/22/21 1038 150 mg at 06/22/21 1038   leucovorin 850 mg in sodium chloride 0.9 % 250 mL infusion  850 mg Intravenous Once Charlaine Dalton R, MD       sodium chloride flush (NS) 0.9 % injection 10 mL  10 mL Intravenous PRN Cammie Sickle, MD   10 mL at 04/20/21 0825      .  PHYSICAL EXAMINATION: ECOG PERFORMANCE STATUS: 0 - Asymptomatic  Vitals:   06/22/21 0900  BP: 116/61  Pulse: (!) 103  Resp: 16  Temp: 99.1 F (37.3 C)    Filed Weights   06/22/21 0900  Weight: 200 lb 9.6 oz (91 kg)     Physical Exam HENT:     Head: Normocephalic and atraumatic.     Mouth/Throat:     Pharynx: No oropharyngeal exudate.  Eyes:     Pupils: Pupils are equal, round, and reactive to  light.  Cardiovascular:     Rate and Rhythm: Normal rate and regular rhythm.  Pulmonary:     Effort: Pulmonary effort is normal. No respiratory distress.     Breath sounds: Normal breath sounds. No wheezing.  Abdominal:     General: Bowel sounds are normal.     Palpations: Abdomen is soft.  There is no mass.     Tenderness: There is no abdominal tenderness. There is no guarding or rebound.  Musculoskeletal:        General: No tenderness. Normal range of motion.     Cervical back: Normal range of motion and neck supple.  Skin:    General: Skin is warm.  Neurological:     Mental Status: She is alert and oriented to person, place, and time.  Psychiatric:        Mood and Affect: Affect normal.     LABORATORY DATA:  I have reviewed the data as listed Lab Results  Component Value Date   WBC 10.4 06/22/2021   HGB 8.4 (L) 06/22/2021   HCT 27.9 (L) 06/22/2021   MCV 85.8 06/22/2021   PLT 234 06/22/2021   Recent Labs    05/18/21 0825 05/25/21 0807 06/01/21 0824 06/22/21 0827  NA 138 136 135 134*  K 4.0 3.6 3.8 4.6  CL 104 100 101 101  CO2 '25 25 25 24  ' GLUCOSE 98 125* 137* 105*  BUN '17 17 17 15  ' CREATININE 0.93 1.02* 1.01* 1.10*  CALCIUM 8.9 8.7* 8.8* 8.4*  GFRNONAA >60 >60 >60 55*  PROT 7.5  --  7.3 7.2  ALBUMIN 4.0  --  3.8 3.6  AST 23  --  29 36  ALT 16  --  17 21  ALKPHOS 79  --  82 116  BILITOT 0.5  --  0.5 0.4    RADIOGRAPHIC STUDIES: I have personally reviewed the radiological images as listed and agreed with the findings in the report. No results found.   ASSESSMENT & PLAN:   Primary adenocarcinoma of gall bladder [facility]) #Metastatic gallbladder adenocarcinoma-CT scan Oct 11th, 2022-shows progressive disease-new masses in the right hepatic lobe/adjacent to gallbladder with recurrent malignancy.  Also new lesion in the right hepatic lobe concerning for metastasis.  Faint nodularity of omentum/peritoneal disease; periportal adenopathy increased.  Rising tumor  marker; CA 19-9- rising; currently on 5-FU CVI+ Onivyde.  I again reviewed with the patient the rising cancer marker ~NOV 2022- 9000+.  #Proceed with cycle #3 chemotherapy today. Labs today reviewed;  acceptable for treatment today. Repeat scan in JAN 2023.   # Diarrhea- G-1; imodium/ Lomotil- improved.   # Anemia sec to chemo-hemoglobin today is 8.3; hold off PRBC transfusion.  Monitor closely. Monitor closely. STABLE; check iron studies/ferritin.   # Abdominal distention/abdominal pain- likely malignant ascites; s/p US/paracentsis- worse- improved on oxycdone.   # Blood glucose-s/p P-BF-BG-105  [HbA1c- May 5.5; continue glipizide 5 mg BID- STABLE.   # HTN-  continue with cozaar- STABLE.  # Hypokalemia- 3.8- sec to cisplatin- improved; continue Kdur 20 meQ once a day- STABLE.   # DISPOSITION:  # chemo today- 5FU+ Onivide # Follow up in 2 weeks- NP; labs- cbc/cmp/ca-19-9; %FU+onivide; chemo- pump off 48 hours later  # Follow up in 4 weeks- MD; labs- cbc/cmp/ca-19-9; %FU+onivide; chemo- pump off 48 hours later-Dr.B   All questions were answered. The patient knows to call the clinic with any problems, questions or concerns.   Cammie Sickle, MD 06/22/2021 11:24 AM

## 2021-06-23 LAB — CANCER ANTIGEN 19-9: CA 19-9: 19534 U/mL — ABNORMAL HIGH (ref 0–35)

## 2021-06-24 ENCOUNTER — Other Ambulatory Visit: Payer: Self-pay

## 2021-06-24 ENCOUNTER — Inpatient Hospital Stay: Payer: Medicare Other | Attending: Internal Medicine

## 2021-06-24 VITALS — BP 114/72 | HR 96 | Resp 16

## 2021-06-24 DIAGNOSIS — C23 Malignant neoplasm of gallbladder: Secondary | ICD-10-CM | POA: Diagnosis not present

## 2021-06-24 DIAGNOSIS — C787 Secondary malignant neoplasm of liver and intrahepatic bile duct: Secondary | ICD-10-CM | POA: Diagnosis not present

## 2021-06-24 DIAGNOSIS — D6481 Anemia due to antineoplastic chemotherapy: Secondary | ICD-10-CM | POA: Insufficient documentation

## 2021-06-24 DIAGNOSIS — Z5111 Encounter for antineoplastic chemotherapy: Secondary | ICD-10-CM | POA: Insufficient documentation

## 2021-06-24 DIAGNOSIS — C792 Secondary malignant neoplasm of skin: Secondary | ICD-10-CM | POA: Diagnosis not present

## 2021-06-24 MED ORDER — SODIUM CHLORIDE 0.9% FLUSH
10.0000 mL | INTRAVENOUS | Status: DC | PRN
  Administered 2021-06-24: 10 mL
  Filled 2021-06-24: qty 10

## 2021-06-24 MED ORDER — HEPARIN SOD (PORK) LOCK FLUSH 100 UNIT/ML IV SOLN
500.0000 [IU] | Freq: Once | INTRAVENOUS | Status: AC | PRN
  Administered 2021-06-24: 500 [IU]
  Filled 2021-06-24: qty 5

## 2021-06-24 NOTE — Progress Notes (Signed)
Patient presented for Pump DC. Pump still has approx. 4.1 MLS until finished. Patient states her pump was beeping yesterday afternoon and she had to contact the company in order to make it stop.  Patient states the beeping did stop eventually and the pump continued infusion. Patient is unable to stay for remainder of treatment and is unable to come back once it is finished. Dr. Rogue Bussing made aware. Okay to remove pump early per MD.

## 2021-06-24 NOTE — Progress Notes (Signed)
After disconnecting pump, RN attempted to flush with NS. Resistance was met. RN pushed down on needle as patient had mentioned that she did to make the pump stop beeping, and was able to flush. Needle repositioned slightly and flushed well with great blood return. Port site assessed and no edema, redness, or pain was present. Patient denies any concerns. Dr. Rogue Bussing made aware. Patient advised to contact clinic should she need anything. Discharged home.

## 2021-07-03 ENCOUNTER — Encounter: Payer: Self-pay | Admitting: Internal Medicine

## 2021-07-04 ENCOUNTER — Other Ambulatory Visit: Payer: Self-pay

## 2021-07-04 ENCOUNTER — Ambulatory Visit (LOCAL_COMMUNITY_HEALTH_CENTER): Payer: Medicare Other

## 2021-07-04 DIAGNOSIS — R7611 Nonspecific reaction to tuberculin skin test without active tuberculosis: Secondary | ICD-10-CM

## 2021-07-04 DIAGNOSIS — Z111 Encounter for screening for respiratory tuberculosis: Secondary | ICD-10-CM

## 2021-07-04 NOTE — Progress Notes (Signed)
In Nurse Clinic for TB screen. Hx +ppd. Denies s/s. TB screening form completed and given to pt. Copy sent for scanning. Josie Saunders, RN

## 2021-07-06 ENCOUNTER — Inpatient Hospital Stay: Payer: Medicare Other

## 2021-07-06 ENCOUNTER — Other Ambulatory Visit: Payer: Self-pay

## 2021-07-06 ENCOUNTER — Encounter: Payer: Self-pay | Admitting: Oncology

## 2021-07-06 ENCOUNTER — Inpatient Hospital Stay (HOSPITAL_BASED_OUTPATIENT_CLINIC_OR_DEPARTMENT_OTHER): Payer: Medicare Other | Admitting: Oncology

## 2021-07-06 VITALS — HR 99

## 2021-07-06 VITALS — BP 103/50 | HR 104 | Temp 97.9°F | Resp 18 | Wt 195.0 lb

## 2021-07-06 DIAGNOSIS — K521 Toxic gastroenteritis and colitis: Secondary | ICD-10-CM | POA: Diagnosis not present

## 2021-07-06 DIAGNOSIS — C23 Malignant neoplasm of gallbladder: Secondary | ICD-10-CM

## 2021-07-06 DIAGNOSIS — T451X5A Adverse effect of antineoplastic and immunosuppressive drugs, initial encounter: Secondary | ICD-10-CM | POA: Insufficient documentation

## 2021-07-06 DIAGNOSIS — Z5111 Encounter for antineoplastic chemotherapy: Secondary | ICD-10-CM | POA: Insufficient documentation

## 2021-07-06 DIAGNOSIS — D6481 Anemia due to antineoplastic chemotherapy: Secondary | ICD-10-CM

## 2021-07-06 LAB — CBC WITH DIFFERENTIAL/PLATELET
Abs Immature Granulocytes: 0.08 10*3/uL — ABNORMAL HIGH (ref 0.00–0.07)
Basophils Absolute: 0 10*3/uL (ref 0.0–0.1)
Basophils Relative: 0 %
Eosinophils Absolute: 0.4 10*3/uL (ref 0.0–0.5)
Eosinophils Relative: 6 %
HCT: 25.9 % — ABNORMAL LOW (ref 36.0–46.0)
Hemoglobin: 8 g/dL — ABNORMAL LOW (ref 12.0–15.0)
Immature Granulocytes: 1 %
Lymphocytes Relative: 18 %
Lymphs Abs: 1.2 10*3/uL (ref 0.7–4.0)
MCH: 25.5 pg — ABNORMAL LOW (ref 26.0–34.0)
MCHC: 30.9 g/dL (ref 30.0–36.0)
MCV: 82.5 fL (ref 80.0–100.0)
Monocytes Absolute: 1.8 10*3/uL — ABNORMAL HIGH (ref 0.1–1.0)
Monocytes Relative: 25 %
Neutro Abs: 3.4 10*3/uL (ref 1.7–7.7)
Neutrophils Relative %: 50 %
Platelets: 264 10*3/uL (ref 150–400)
RBC: 3.14 MIL/uL — ABNORMAL LOW (ref 3.87–5.11)
RDW: 20.3 % — ABNORMAL HIGH (ref 11.5–15.5)
WBC: 7 10*3/uL (ref 4.0–10.5)
nRBC: 0 % (ref 0.0–0.2)

## 2021-07-06 LAB — COMPREHENSIVE METABOLIC PANEL
ALT: 26 U/L (ref 0–44)
AST: 39 U/L (ref 15–41)
Albumin: 3.2 g/dL — ABNORMAL LOW (ref 3.5–5.0)
Alkaline Phosphatase: 154 U/L — ABNORMAL HIGH (ref 38–126)
Anion gap: 12 (ref 5–15)
BUN: 14 mg/dL (ref 8–23)
CO2: 23 mmol/L (ref 22–32)
Calcium: 8.5 mg/dL — ABNORMAL LOW (ref 8.9–10.3)
Chloride: 97 mmol/L — ABNORMAL LOW (ref 98–111)
Creatinine, Ser: 1.08 mg/dL — ABNORMAL HIGH (ref 0.44–1.00)
GFR, Estimated: 57 mL/min — ABNORMAL LOW (ref >60)
Glucose, Bld: 113 mg/dL — ABNORMAL HIGH (ref 70–99)
Potassium: 4.2 mmol/L (ref 3.5–5.1)
Sodium: 132 mmol/L — ABNORMAL LOW (ref 135–145)
Total Bilirubin: 0.7 mg/dL (ref 0.3–1.2)
Total Protein: 7.1 g/dL (ref 6.5–8.1)

## 2021-07-06 LAB — MAGNESIUM: Magnesium: 2.1 mg/dL (ref 1.7–2.4)

## 2021-07-06 MED ORDER — SODIUM CHLORIDE 0.9 % IV SOLN
10.0000 mg | Freq: Once | INTRAVENOUS | Status: AC
  Administered 2021-07-06: 10:00:00 10 mg via INTRAVENOUS
  Filled 2021-07-06: qty 10

## 2021-07-06 MED ORDER — SODIUM CHLORIDE 0.9 % IV SOLN
150.0000 mg | Freq: Once | INTRAVENOUS | Status: AC
  Administered 2021-07-06: 11:00:00 150 mg via INTRAVENOUS
  Filled 2021-07-06: qty 34.88

## 2021-07-06 MED ORDER — SODIUM CHLORIDE 0.9 % IV SOLN
850.0000 mg | Freq: Once | INTRAVENOUS | Status: AC
  Administered 2021-07-06: 12:00:00 850 mg via INTRAVENOUS
  Filled 2021-07-06: qty 42.5

## 2021-07-06 MED ORDER — PALONOSETRON HCL INJECTION 0.25 MG/5ML
0.2500 mg | Freq: Once | INTRAVENOUS | Status: AC
  Administered 2021-07-06: 10:00:00 0.25 mg via INTRAVENOUS
  Filled 2021-07-06: qty 5

## 2021-07-06 MED ORDER — ONDANSETRON HCL 8 MG PO TABS
ORAL_TABLET | ORAL | 1 refills | Status: AC
Start: 2021-07-06 — End: ?

## 2021-07-06 MED ORDER — SODIUM CHLORIDE 0.9 % IV SOLN
Freq: Once | INTRAVENOUS | Status: AC
  Filled 2021-07-06: qty 250

## 2021-07-06 MED ORDER — HEPARIN SOD (PORK) LOCK FLUSH 100 UNIT/ML IV SOLN
500.0000 [IU] | Freq: Once | INTRAVENOUS | Status: DC
  Filled 2021-07-06: qty 5

## 2021-07-06 MED ORDER — SODIUM CHLORIDE 0.9% FLUSH
10.0000 mL | INTRAVENOUS | Status: DC | PRN
  Administered 2021-07-06: 09:00:00 10 mL via INTRAVENOUS
  Filled 2021-07-06: qty 10

## 2021-07-06 MED ORDER — SODIUM CHLORIDE 0.9 % IV SOLN
5000.0000 mg | INTRAVENOUS | Status: DC
  Administered 2021-07-06: 13:00:00 5000 mg via INTRAVENOUS
  Filled 2021-07-06: qty 100

## 2021-07-06 MED ORDER — PROCHLORPERAZINE MALEATE 10 MG PO TABS: 10.0000 mg | ORAL_TABLET | Freq: Four times a day (QID) | ORAL | 1 refills | Status: AC | PRN

## 2021-07-06 MED ORDER — DIPHENOXYLATE-ATROPINE 2.5-0.025 MG PO TABS
1.0000 | ORAL_TABLET | Freq: Once | ORAL | Status: AC
  Administered 2021-07-06: 10:00:00 1 via ORAL
  Filled 2021-07-06: qty 1

## 2021-07-06 NOTE — Patient Instructions (Signed)
[facility] AT [place]  Discharge Instructions: Thank you for choosing [facility] to provide your oncology and hematology care.  If you have a lab appointment with the [facility], please go directly to the [facility] and check in at the registration area.  Wear comfortable clothing and clothing appropriate for easy access to any Portacath or PICC line.   We strive to give you quality time with your provider. You may need to reschedule your appointment if you arrive late (15 or more minutes).  Arriving late affects you and other patients whose appointments are after yours.  Also, if you miss three or more appointments without notifying the office, you may be dismissed from the clinic at the providers discretion.      For prescription refill requests, have your pharmacy contact our office and allow 72 hours for refills to be completed.    Today you received IV fluids and the following chemotherapy and/or immunotherapy agents:  fluorouracil (ADRUCIL), irinotecan LIPOSOME (ONIVYDE)  and leucovorin  To help prevent nausea and vomiting after your treatment, we encourage you to take your nausea medication as directed.  BELOW ARE SYMPTOMS THAT SHOULD BE REPORTED IMMEDIATELY: *FEVER GREATER THAN 100.4 F (38 C) OR HIGHER *CHILLS OR SWEATING *NAUSEA AND VOMITING THAT IS NOT CONTROLLED WITH YOUR NAUSEA MEDICATION *UNUSUAL SHORTNESS OF BREATH *UNUSUAL BRUISING OR BLEEDING *URINARY PROBLEMS (pain or burning when urinating, or frequent urination) *BOWEL PROBLEMS (unusual diarrhea, constipation, pain near the anus) TENDERNESS IN MOUTH AND THROAT WITH OR WITHOUT PRESENCE OF ULCERS (sore throat, sores in mouth, or a toothache) UNUSUAL RASH, SWELLING OR PAIN  UNUSUAL VAGINAL DISCHARGE OR ITCHING   Items with * indicate a potential emergency and should be followed up as soon as possible or go to the Emergency Department if any problems should occur.  Please show  the CHEMOTHERAPY ALERT CARD or IMMUNOTHERAPY ALERT CARD at check-in to the Emergency Department and triage nurse.  Should you have questions after your visit or need to cancel or reschedule your appointment, please contact [facility] CANCER [place] AT [place]  [phone] and follow the prompts.  Office hours are 8:00 a.m. to 4:30 p.m. Monday - Friday. Please note that voicemails left after 4:00 p.m. may not be returned until the following business day.  We are closed weekends and major holidays. You have access to a nurse at all times for urgent questions. Please call the main number to the clinic [phone] and follow the prompts.  For any non-urgent questions, you may also contact your provider using MyChart. We now offer e-Visits for anyone 67 and older to request care online for non-urgent symptoms. For details visit [email or web address].   Also download the MyChart app! Go to the app store, search "MyChart", open the app, select [facility], and log in with your MyChart username and password.  Due to Covid, a mask is required upon entering the hospital/clinic. If you do not have a mask, one will be given to you upon arrival. For doctor visits, patients may have 1 support person aged 31 or older with them. For treatment visits, patients cannot have anyone with them due to current Covid guidelines and our immunocompromised population.

## 2021-07-06 NOTE — Progress Notes (Signed)
Patient's HR 104, Per MD ok to treat with chemo today. IV fluids ordered. Rechecked HR 99.

## 2021-07-06 NOTE — Progress Notes (Signed)
Patient here for follow up. Compazine and zofran refilled, per pt request

## 2021-07-06 NOTE — Progress Notes (Signed)
[facility] CONSULT NOTE  Patient Care Team: Revelo, Elyse Jarvis, MD as PCP - General (Family Medicine) Rico Junker, RN as Registered Nurse Christene Lye, MD (General Surgery) Clent Jacks, RN as Oncology Nurse Navigator  CHIEF COMPLAINTS/PURPOSE OF CONSULTATION: GALLBLADDER CANCER   #  Oncology History Overview Note  10/25- GALLBLADDER CANCER- [Dr.Cannon]/Dr.Byerly- laproscopic cholecystectomy- pT Category: pT3; pN1' POSITIVE for PNI; Histologic Type: Adenocarcinoma, biliary type  Histologic Grade: G3, poorly differentiated  Tumor Size: Greatest dimension: 2.2 cm  Tumor Extent: Tumor directly invades the liver  Lymphovascular Invasion: Present pre-treatement   Tumor Site: Fundus MARGINS  Margin Status for Invasive Carcinoma:      Margin Involved by Invasive Carcinoma: Liver parenchymal / hepatic  Bed; Number of Lymph Nodes with Tumor: 1       Number of Lymph Nodes Examined: 1    # October 25th 2021- Incidental gallbladder ADENOCA-s/p acute cholecystectomy [Dr.Cannon]. Stage III- T3N1; liver margin positive.  CT chest negative for any metastatic disease.  MRI abdomen with and without contrast-residual disease noted gallbladder bed; no evidence of distant metastases or lymphadenopathy. NOv 3rd 2021-CA 19-9 > 2000;     # NEOADJUVANT CHEMO-12/15-  Gem-cis [d1 & d-8 q 21 days] ; DEC 2021- [id number].   June 14th, 2022- Dr.Byerly- METASTATIC DISEASE [. Multiple firm nodules were identified adherent to the anterior abdominal wall in both the RUQ as well as periumbilical area. These were primarily in the omentum, though one was between the liver and the abdominal wall.  Several of these nodules were excised and sent to pathology for frozen section, with preliminary pathology results consistent with adenocarcinoma]  # July 6th- Gem-Cis- Durva q21 days  9/21- cycle6day#1-decrease dose of gemcitabine to 80 mg meter square; cisplatin to 20 mg of  metered square; same dose of Durva; day 8-no changes; on pro.   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  Kristina Orozco 65 y.o.  female stage IV adenocarcinoma gallbladder currently on palliative chemotherapy with 5FU+liposomal irinotecan.  Patient follows up with Dr.Brahmanday who is off today, I am covering him to see this patient. She report feeling tired. Nausea in the morning and antiemetic helps.  RUQ pain is controlled with current pain regimen.  Episodes of diarrhea, improves after taking imodium/lomotil. She also is on senna kot on some days.  She says her BM is regular on most days.    Complains of abdominal pain- RUQ currently improved on Oxycodone.  Episode of diarrhea-improved after taking Imodium/Lomotil; poor taste. With nausea/ vomitting. Lost weight.  Review of Systems  Constitutional:  Positive for malaise/fatigue. Negative for chills, diaphoresis and fever.  HENT:  Negative for  nosebleeds and sore throat.   Eyes:  Negative for double vision.  Respiratory:  Negative for cough, hemoptysis, sputum production, shortness of breath and wheezing.   Cardiovascular:  Negative for chest pain, palpitations, orthopnea and leg  swelling.  Gastrointestinal:  Positive for abdominal pain. Negative for blood in stool, diarrhea, heartburn, melena, nausea and vomiting.  Genitourinary:  Negative for dysuria, frequency and urgency.  Musculoskeletal:  Negative for back pain and joint pain.  Skin: Negative.  Negative for itching and rash.  Neurological:  Negative for dizziness, tingling, focal weakness, weakness and headaches.  Endo/Heme/Allergies:  Does not bruise/bleed easily.  Psychiatric/Behavioral:  Negative for depression. The patient is not nervous/anxious and does not have insomnia.  +   MEDICAL HISTORY:  Past Medical History:  Diagnosis Date   Cancer ([place]) 05/17/2020   gallbladder   Diabetes mellitus without complication ([place])    History of chemotherapy    started 06/2020   Hypertension     SURGICAL HISTORY: Past Surgical History:  Procedure Laterality Date   ABDOMINAL HYSTERECTOMY     APPENDECTOMY  2004   BREAST BIOPSY Left 2011   BREAST BIOPSY Right 08/26/13   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/28/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   CHOLECYSTECTOMY N/A 05/17/2020   Procedure: LAPAROSCOPIC CHOLECYSTECTOMY;  Surgeon: Fredirick Maudlin, MD;  Location: [facility];  Service: General;  Laterality: N/A;   COLONOSCOPY WITH PROPOFOL N/A 01/31/2018   Procedure: COLONOSCOPY WITH PROPOFOL;  Surgeon: Jonathon Bellows, MD;  Location: [facility];  Service: Gastroenterology;  Laterality: N/A;   LAPAROSCOPIC LYSIS OF ADHESIONS  01/04/2021   Procedure: LAPAROSCOPIC LYSIS OF ADHESIONS;  Surgeon: Stark Klein, MD;  Location: [facility];  Service: General;;  1 HOUR   LAPAROSCOPY N/A 01/04/2021   Procedure: LAPAROSCOPY DIAGNOSTIC;  Surgeon: Stark Klein, MD;  Location: [facility];  Service: General;  Laterality: N/A;  EPIDURAL   PORTACATH PLACEMENT Right 07/02/2020   Procedure: INSERTION PORT-A-CATH, chemotherapy;  Surgeon: Fredirick Maudlin, MD;  Location: [facility];  Service: General;  Laterality: Right;    SOCIAL HISTORY: Social History   Socioeconomic  History   Marital status: Single    Spouse name: Not on file   Number of children: Not on file   Years of education: Not on file   Highest education level: Not on file  Occupational History   Occupation: special needs worker  Tobacco Use   Smoking status: Never   Smokeless tobacco: Never  Vaping Use   Vaping Use: Never used  Substance and Sexual Activity   Alcohol use: Yes    Alcohol/week: 2.0 standard drinks    Types: 2 Cans of beer per week    Comment: beer on weekend   Drug use: No   Sexual activity: Not Currently  Other Topics Concern   Not on file  Social History Narrative   Lives in [place]; with son. 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 Never smoked; beer/iqour over weekends.    Social Determinants of Health   Financial Resource Strain: Not on file  Food Insecurity: Not on file  Transportation Needs: Not on file  Physical Activity: Not on file  Stress: Not on file  Social Connections: Not on file  Intimate Partner Violence: Not on file    FAMILY HISTORY: Family History  Problem Relation Age of Onset   Pancreatic cancer Mother        AT 53 years   Breast cancer Sister        appx-55 years   Pancreatic cancer Maternal  Grandmother     ALLERGIES:  is allergic to seasonal ic [cholestatin].  MEDICATIONS:  Current Outpatient Medications  Medication Sig Dispense Refill   Chlorpheniramine Maleate (ALLERGY RELIEF PO) Take 1 tablet by mouth daily as needed (allergies).     CINNAMON PO Take 1 tablet by mouth daily.     diphenoxylate-atropine (LOMOTIL) 2.5-0.025 MG tablet Take 1 tablet by mouth 4 (four) times daily as needed for diarrhea or loose stools. Take it along with immodium 60 tablet 0   GARLIC PO Take 1 tablet by mouth daily.     glipiZIDE (GLUCOTROL) 5 MG tablet Take 1 tablet (5 mg total) by mouth 2 (two) times daily before a meal. 60 tablet 6   lidocaine-prilocaine (EMLA) cream Apply 1 application topically as needed. Apply on the  port 30-45 mins prior to port access 30 g 1   losartan (COZAAR) 25 MG tablet Take 25 mg by mouth every morning.      Multiple Vitamins-Minerals (MULTIVITAMIN WITH MINERALS) tablet Take 1 tablet by mouth daily.     oxyCODONE (OXY IR/ROXICODONE) 5 MG immediate release tablet Take 1-2 tablets (5-10 mg total) by mouth every 6 (six) hours as needed for severe pain. 60 tablet 0   potassium chloride SA (KLOR-CON) 20 MEQ tablet 1 pill twice a day 60 tablet 3   pravastatin (PRAVACHOL) 10 MG tablet Take 10 mg by mouth 4 (four) times a week.     traMADol (ULTRAM) 50 MG tablet Take 1 tablet (50 mg total) by mouth every 8 (eight) hours as needed. Watch out dizziness after taking the pills. Caution with driving 45 tablet 0   ondansetron (ZOFRAN) 8 MG tablet One pill every 8 hours as needed for nausea/vomitting. 40 tablet 1   prochlorperazine (COMPAZINE) 10 MG tablet Take 1 tablet (10 mg total) by mouth every 6 (six) hours as needed for nausea or vomiting. 40 tablet 1   No current facility-administered medications for this visit.   Facility-Administered Medications Ordered in Other Visits  Medication Dose Route Frequency Provider Last Rate Last Admin   fluorouracil (ADRUCIL) 5,000 mg in sodium chloride 0.9 % 150 mL chemo infusion  5,000 mg Intravenous 1 day or 1 dose Earlie Server, MD       heparin lock flush 100 UNIT/ML injection            heparin lock flush 100 unit/mL  500 Units Intravenous Once Cammie Sickle, MD       irinotecan LIPOSOME (ONIVYDE) 150 mg in sodium chloride 0.9 % 500 mL chemo infusion  150 mg Intravenous Once Earlie Server, MD 357 mL/hr at 07/06/21 1032 150 mg at 07/06/21 1032   leucovorin 850 mg in sodium chloride 0.9 % 250 mL infusion  850 mg Intravenous Once Earlie Server, MD       sodium chloride flush (NS) 0.9 % injection 10 mL  10 mL Intravenous PRN Cammie Sickle, MD   10 mL at 04/20/21 0825      .  PHYSICAL EXAMINATION: ECOG PERFORMANCE STATUS: 0 - Asymptomatic  Vitals:    07/06/21 0843  BP: (!) 103/50  Pulse: (!) 104  Resp: 18  Temp: 97.9 F (36.6 C)    Filed Weights   07/06/21 0843  Weight: 195 lb (88.5 kg)     Physical Exam HENT:     Head: Normocephalic and atraumatic.     Mouth/Throat:     Pharynx: No oropharyngeal exudate.  Eyes:     Pupils: Pupils are equal,  round, and reactive to light.  Cardiovascular:     Rate and Rhythm: Normal rate and regular rhythm.  Pulmonary:     Effort: Pulmonary effort is normal. No respiratory distress.     Breath sounds: Normal breath sounds. No wheezing.  Abdominal:     General: Bowel sounds are normal.     Palpations: Abdomen is soft. There is no mass.     Tenderness: There is no abdominal tenderness. There is no guarding or rebound.  Musculoskeletal:        General: No tenderness. Normal range of motion.     Cervical back: Normal range of motion and neck supple.  Skin:    General: Skin is warm.  Neurological:     Mental Status: She is alert and oriented to person, place, and time.  Psychiatric:        Mood and Affect: Affect normal.     LABORATORY DATA:  I have reviewed the data as listed Lab Results  Component Value Date   WBC 7.0 07/06/2021   HGB 8.0 (L) 07/06/2021   HCT 25.9 (L) 07/06/2021   MCV 82.5 07/06/2021   PLT 264 07/06/2021   Recent Labs    06/01/21 0824 06/22/21 0827 07/06/21 0823  NA 135 134* 132*  K 3.8 4.6 4.2  CL 101 101 97*  CO2 _0 GLUCOSE 137* 105* 113*  BUN _1 CREATININE 1.01* 1.10* 1.08*  CALCIUM 8.8* 8.4* 8.5*  GFRNONAA >60 55* 57*  PROT 7.3 7.2 7.1  ALBUMIN 3.8 3.6 3.2*  AST 29 36 39  ALT _2 ALKPHOS 82 116 154*  BILITOT 0.5 0.4 0.7     RADIOGRAPHIC STUDIES: I have personally reviewed the radiological images as listed and agreed with the findings in the report. No results found.   ASSESSMENT & PLAN:  1. 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 Encounter for antineoplastic chemotherapy   3. Chemotherapy induced  diarrhea    # Gall bladder cancer, currently on palliative 2nd line 5-FU _ liposomal irinotecan.  Labs are reviewed and discussed with patient. Proceed with cycle 4 5-FU and irinotecan.  Tumor marker is rising. Dr.B plans to repeat image in Jan 2023.   # chemotherapy induced anemia, Hb trending down to 8. Ok to proceed with chemotherapy. Cbc in 1 week, +/- PRBC transfusion.   # Chemotherapy induced diarrhea, continue anti diarrhea regimen. Recommend patient to stop sennakot.   # Hypokalemia, continue potassium supplementation. Stable K  Follow up in 1 week for lab - cbc hold tube +/- PRBC transfusion.  2 weeks lab Dr.B 5-FU liposomal irinotecan  All questions were answered. The patient knows to call the clinic with any problems, questions or concerns.   Earlie Server, MD 07/06/2021 10:47 AM

## 2021-07-06 NOTE — Progress Notes (Signed)
Patient tolerated Irinotecan / Leucovorin infusion well today. 1 L IVF also administered. No concerns voiced.   5-FU pump applied, no questions at this time. Patient discharged. Stable.

## 2021-07-07 LAB — CANCER ANTIGEN 19-9: CA 19-9: 23832 U/mL — ABNORMAL HIGH (ref 0–35)

## 2021-07-08 ENCOUNTER — Inpatient Hospital Stay: Payer: Medicare Other

## 2021-07-08 ENCOUNTER — Other Ambulatory Visit: Payer: Self-pay

## 2021-07-08 DIAGNOSIS — C23 Malignant neoplasm of gallbladder: Secondary | ICD-10-CM

## 2021-07-08 DIAGNOSIS — Z5111 Encounter for antineoplastic chemotherapy: Secondary | ICD-10-CM | POA: Diagnosis not present

## 2021-07-08 MED ORDER — HEPARIN SOD (PORK) LOCK FLUSH 100 UNIT/ML IV SOLN
500.0000 [IU] | Freq: Once | INTRAVENOUS | Status: AC | PRN
  Administered 2021-07-08: 500 [IU]
  Filled 2021-07-08: qty 5

## 2021-07-08 MED ORDER — SODIUM CHLORIDE 0.9% FLUSH
10.0000 mL | INTRAVENOUS | Status: DC | PRN
  Administered 2021-07-08: 10 mL
  Filled 2021-07-08: qty 10

## 2021-07-12 ENCOUNTER — Other Ambulatory Visit: Payer: Self-pay | Admitting: Oncology

## 2021-07-12 ENCOUNTER — Other Ambulatory Visit: Payer: Self-pay

## 2021-07-12 ENCOUNTER — Inpatient Hospital Stay: Payer: Medicare Other

## 2021-07-12 DIAGNOSIS — C23 Malignant neoplasm of gallbladder: Secondary | ICD-10-CM

## 2021-07-12 DIAGNOSIS — D649 Anemia, unspecified: Secondary | ICD-10-CM

## 2021-07-12 DIAGNOSIS — Z5111 Encounter for antineoplastic chemotherapy: Secondary | ICD-10-CM | POA: Diagnosis not present

## 2021-07-12 LAB — CBC WITH DIFFERENTIAL/PLATELET
Abs Immature Granulocytes: 0.31 10*3/uL — ABNORMAL HIGH (ref 0.00–0.07)
Basophils Absolute: 0.1 10*3/uL (ref 0.0–0.1)
Basophils Relative: 1 %
Eosinophils Absolute: 0.6 10*3/uL — ABNORMAL HIGH (ref 0.0–0.5)
Eosinophils Relative: 8 %
HCT: 26.6 % — ABNORMAL LOW (ref 36.0–46.0)
Hemoglobin: 7.9 g/dL — ABNORMAL LOW (ref 12.0–15.0)
Immature Granulocytes: 4 %
Lymphocytes Relative: 14 %
Lymphs Abs: 1.1 10*3/uL (ref 0.7–4.0)
MCH: 24.3 pg — ABNORMAL LOW (ref 26.0–34.0)
MCHC: 29.7 g/dL — ABNORMAL LOW (ref 30.0–36.0)
MCV: 81.8 fL (ref 80.0–100.0)
Monocytes Absolute: 0.2 10*3/uL (ref 0.1–1.0)
Monocytes Relative: 2 %
Neutro Abs: 6 10*3/uL (ref 1.7–7.7)
Neutrophils Relative %: 71 %
Platelets: 204 10*3/uL (ref 150–400)
RBC: 3.25 MIL/uL — ABNORMAL LOW (ref 3.87–5.11)
RDW: 20.7 % — ABNORMAL HIGH (ref 11.5–15.5)
WBC: 8.3 10*3/uL (ref 4.0–10.5)
nRBC: 0 % (ref 0.0–0.2)

## 2021-07-12 LAB — SAMPLE TO BLOOD BANK

## 2021-07-13 ENCOUNTER — Inpatient Hospital Stay: Payer: Medicare Other

## 2021-07-13 DIAGNOSIS — Z5111 Encounter for antineoplastic chemotherapy: Secondary | ICD-10-CM | POA: Diagnosis not present

## 2021-07-13 DIAGNOSIS — C23 Malignant neoplasm of gallbladder: Secondary | ICD-10-CM

## 2021-07-13 DIAGNOSIS — D649 Anemia, unspecified: Secondary | ICD-10-CM

## 2021-07-13 LAB — PREPARE RBC (CROSSMATCH)

## 2021-07-13 MED ORDER — HEPARIN SOD (PORK) LOCK FLUSH 100 UNIT/ML IV SOLN
500.0000 [IU] | Freq: Every day | INTRAVENOUS | Status: AC | PRN
  Administered 2021-07-13: 12:00:00 500 [IU]
  Filled 2021-07-13: qty 5

## 2021-07-13 MED ORDER — ACETAMINOPHEN 325 MG PO TABS
650.0000 mg | ORAL_TABLET | Freq: Once | ORAL | Status: AC
  Administered 2021-07-13: 09:00:00 650 mg via ORAL
  Filled 2021-07-13: qty 2

## 2021-07-13 MED ORDER — SODIUM CHLORIDE 0.9% FLUSH
10.0000 mL | INTRAVENOUS | Status: AC | PRN
  Administered 2021-07-13: 12:00:00 10 mL
  Filled 2021-07-13: qty 10

## 2021-07-13 MED ORDER — DIPHENHYDRAMINE HCL 25 MG PO CAPS
25.0000 mg | ORAL_CAPSULE | Freq: Once | ORAL | Status: AC
  Administered 2021-07-13: 09:00:00 25 mg via ORAL
  Filled 2021-07-13: qty 1

## 2021-07-13 MED ORDER — SODIUM CHLORIDE 0.9% IV SOLUTION
250.0000 mL | Freq: Once | INTRAVENOUS | Status: AC
  Administered 2021-07-13: 09:00:00 250 mL via INTRAVENOUS
  Filled 2021-07-13: qty 250

## 2021-07-13 MED ORDER — HEPARIN SOD (PORK) LOCK FLUSH 100 UNIT/ML IV SOLN
INTRAVENOUS | Status: AC
  Filled 2021-07-13: qty 5

## 2021-07-14 LAB — BPAM RBC
Blood Product Expiration Date: 202301172359
ISSUE DATE / TIME: 202212210944
Unit Type and Rh: 6200

## 2021-07-14 LAB — TYPE AND SCREEN
ABO/RH(D): A POS
Antibody Screen: NEGATIVE
Unit division: 0

## 2021-07-19 ENCOUNTER — Other Ambulatory Visit: Payer: Self-pay | Admitting: *Deleted

## 2021-07-20 ENCOUNTER — Inpatient Hospital Stay: Payer: Medicare Other

## 2021-07-20 ENCOUNTER — Inpatient Hospital Stay (HOSPITAL_BASED_OUTPATIENT_CLINIC_OR_DEPARTMENT_OTHER): Payer: Medicare Other | Admitting: Internal Medicine

## 2021-07-20 ENCOUNTER — Encounter: Payer: Self-pay | Admitting: Internal Medicine

## 2021-07-20 ENCOUNTER — Other Ambulatory Visit: Payer: Self-pay

## 2021-07-20 DIAGNOSIS — C23 Malignant neoplasm of gallbladder: Secondary | ICD-10-CM

## 2021-07-20 DIAGNOSIS — Z5111 Encounter for antineoplastic chemotherapy: Secondary | ICD-10-CM | POA: Diagnosis not present

## 2021-07-20 LAB — CBC WITH DIFFERENTIAL/PLATELET
Abs Immature Granulocytes: 0.08 10*3/uL — ABNORMAL HIGH (ref 0.00–0.07)
Basophils Absolute: 0.1 10*3/uL (ref 0.0–0.1)
Basophils Relative: 1 %
Eosinophils Absolute: 0.3 10*3/uL (ref 0.0–0.5)
Eosinophils Relative: 5 %
HCT: 30.6 % — ABNORMAL LOW (ref 36.0–46.0)
Hemoglobin: 9.5 g/dL — ABNORMAL LOW (ref 12.0–15.0)
Immature Granulocytes: 1 %
Lymphocytes Relative: 16 %
Lymphs Abs: 1 10*3/uL (ref 0.7–4.0)
MCH: 25.3 pg — ABNORMAL LOW (ref 26.0–34.0)
MCHC: 31 g/dL (ref 30.0–36.0)
MCV: 81.6 fL (ref 80.0–100.0)
Monocytes Absolute: 2.5 10*3/uL — ABNORMAL HIGH (ref 0.1–1.0)
Monocytes Relative: 39 %
Neutro Abs: 2.4 10*3/uL (ref 1.7–7.7)
Neutrophils Relative %: 38 %
Platelets: 136 10*3/uL — ABNORMAL LOW (ref 150–400)
RBC: 3.75 MIL/uL — ABNORMAL LOW (ref 3.87–5.11)
RDW: 20.5 % — ABNORMAL HIGH (ref 11.5–15.5)
Smear Review: NORMAL
WBC: 6.3 10*3/uL (ref 4.0–10.5)
nRBC: 0 % (ref 0.0–0.2)

## 2021-07-20 LAB — COMPREHENSIVE METABOLIC PANEL
ALT: 33 U/L (ref 0–44)
AST: 74 U/L — ABNORMAL HIGH (ref 15–41)
Albumin: 2.2 g/dL — ABNORMAL LOW (ref 3.5–5.0)
Alkaline Phosphatase: 264 U/L — ABNORMAL HIGH (ref 38–126)
Anion gap: 13 (ref 5–15)
BUN: 15 mg/dL (ref 8–23)
CO2: 17 mmol/L — ABNORMAL LOW (ref 22–32)
Calcium: 6.8 mg/dL — ABNORMAL LOW (ref 8.9–10.3)
Chloride: 105 mmol/L (ref 98–111)
Creatinine, Ser: 0.76 mg/dL (ref 0.44–1.00)
GFR, Estimated: >60 mL/min (ref >60)
Glucose, Bld: 100 mg/dL — ABNORMAL HIGH (ref 70–99)
Potassium: 3.2 mmol/L — ABNORMAL LOW (ref 3.5–5.1)
Sodium: 135 mmol/L (ref 135–145)
Total Bilirubin: 0.6 mg/dL (ref 0.3–1.2)
Total Protein: 5.3 g/dL — ABNORMAL LOW (ref 6.5–8.1)

## 2021-07-20 LAB — MAGNESIUM: Magnesium: 1.8 mg/dL (ref 1.7–2.4)

## 2021-07-20 MED ORDER — SODIUM CHLORIDE 0.9% FLUSH
10.0000 mL | Freq: Once | INTRAVENOUS | Status: AC
  Administered 2021-07-20: 08:00:00 10 mL via INTRAVENOUS
  Filled 2021-07-20: qty 10

## 2021-07-20 MED ORDER — OXYCODONE HCL 5 MG PO TABS: 5.0000 mg | ORAL_TABLET | Freq: Four times a day (QID) | ORAL | 0 refills | Status: AC | PRN

## 2021-07-20 MED ORDER — HEPARIN SOD (PORK) LOCK FLUSH 100 UNIT/ML IV SOLN
500.0000 [IU] | Freq: Once | INTRAVENOUS | Status: AC
  Administered 2021-07-20: 11:00:00 500 [IU] via INTRAVENOUS
  Filled 2021-07-20: qty 5

## 2021-07-20 MED ORDER — SODIUM CHLORIDE 0.9 % IV SOLN
Freq: Once | INTRAVENOUS | Status: AC
  Filled 2021-07-20: qty 250

## 2021-07-20 NOTE — Patient Instructions (Signed)
#  Hold off losartan-until next visit.

## 2021-07-20 NOTE — Progress Notes (Signed)
[name] CONSULT NOTE  Patient Care Team: Revelo, Elyse Jarvis, MD as PCP - General (Family Medicine) Rico Junker, RN as Registered Nurse Christene Lye, MD (General Surgery) Clent Jacks, RN as Oncology Nurse Navigator  CHIEF COMPLAINTS/PURPOSE OF CONSULTATION: GALLBLADDER CANCER   #  Oncology History Overview Note  10/25- GALLBLADDER CANCER- [Dr.Cannon]/Dr.Byerly- laproscopic cholecystectomy- pT Category: pT3; pN1' POSITIVE for PNI; Histologic Type: Adenocarcinoma, biliary type  Histologic Grade: G3, poorly differentiated  Tumor Size: Greatest dimension: 2.2 cm  Tumor Extent: Tumor directly invades the liver  Lymphovascular Invasion: Present pre-treatement   Tumor Site: Fundus MARGINS  Margin Status for Invasive Carcinoma:      Margin Involved by Invasive Carcinoma: Liver parenchymal / hepatic  Bed; Number of Lymph Nodes with Tumor: 1       Number of Lymph Nodes Examined: 1    # October 25th 2021- Incidental gallbladder ADENOCA-s/p acute cholecystectomy [Dr.Cannon]. Stage III- T3N1; liver margin positive.  CT chest negative for any metastatic disease.  MRI abdomen with and without contrast-residual disease noted gallbladder bed; no evidence of distant metastases or lymphadenopathy. NOv 3rd 2021-CA 19-9 > 2000;     # NEOADJUVANT CHEMO-12/15-  Gem-cis [d1 & d-8 q 21 days] ; DEC 2021- [id number].   June 14th, 2022- Dr.Byerly- METASTATIC DISEASE [. Multiple firm nodules were identified adherent to the anterior abdominal wall in both the RUQ as well as periumbilical area. These were primarily in the omentum, though one was between the liver and the abdominal wall.  Several of these nodules were excised and sent to pathology for frozen section, with preliminary pathology results consistent with adenocarcinoma]  # July 6th- Gem-Cis- Durva q21 days  9/21- cycle6day#1-decrease dose of gemcitabine to 80 mg meter square; cisplatin to 20 mg of  metered square; same dose of Durva; day 8-no changes; on pro. PROGRESSION of disease.   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)   05/27/2020 Cancer Staging   Staging form: Gallbladder, AJCC 8th Edition - Clinical: Stage IIIB (cT3, cN1, cM0) - Signed by Cammie Sickle, MD on 08/05/2020    07/07/2020 - 04/20/2021 Chemotherapy   Patient is on Treatment Plan : BILIARY TRACT Cisplatin + Gemcitabine D1,8 q21d     05/18/2021 -  Chemotherapy   Patient is on Treatment Plan : PANCREAS Liposomal Irinotecan / Leucovorin / 5-FU  q14d        HISTORY OF PRESENTING ILLNESS: Alone/ambulating independently.  Kristina Orozco 65 y.o.  female stage IV adenocarcinoma gallbladder currently on palliative chemotherapy with 5FU+Onivyde-status post 4 cycles.  Continues to have intermittent abdominal pain in the right upper quadrant.  Currently stable on oxycodone.  Episode of diarrhea-improved after taking Imodium/Lomotil; poor taste. With nausea/ vomitting. Lost weight.  Review of Systems  Constitutional:  Positive for malaise/fatigue. Negative for chills, diaphoresis and fever.  HENT:  Negative for nosebleeds and sore throat.   Eyes:  Negative for double vision.  Respiratory:  Negative for cough, hemoptysis, sputum production, shortness of breath and wheezing.   Cardiovascular:  Negative for chest pain, palpitations, orthopnea and leg swelling.  Gastrointestinal:  Positive for abdominal pain. Negative for blood in stool, diarrhea, heartburn, melena,  nausea and vomiting.  Genitourinary:  Negative for dysuria, frequency and urgency.  Musculoskeletal:  Negative for back pain and joint pain.  Skin: Negative.  Negative for  itching and rash.  Neurological:  Negative for dizziness, tingling, focal weakness, weakness and headaches.  Endo/Heme/Allergies:  Does not bruise/bleed easily.  Psychiatric/Behavioral:  Negative for depression. The patient is not nervous/anxious and does not have insomnia.  +   MEDICAL HISTORY:  Past Medical History:  Diagnosis Date   Cancer ([place]) 05/17/2020   gallbladder   Diabetes mellitus without complication ([place])    History of chemotherapy    started 06/2020   Hypertension     SURGICAL HISTORY: Past Surgical History:  Procedure Laterality Date   ABDOMINAL HYSTERECTOMY     APPENDECTOMY  2004   BREAST BIOPSY Left 2011   BREAST BIOPSY Right 08/26/13   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/28/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   CHOLECYSTECTOMY N/A 05/17/2020   Procedure: LAPAROSCOPIC CHOLECYSTECTOMY;  Surgeon: Fredirick Maudlin, MD;  Location: [facility] ORS;  Service: General;  Laterality: N/A;   COLONOSCOPY WITH PROPOFOL N/A 01/31/2018   Procedure: COLONOSCOPY WITH PROPOFOL;  Surgeon: Jonathon Bellows, MD;  Location: [facility] ENDOSCOPY;  Service: Gastroenterology;  Laterality: N/A;   LAPAROSCOPIC LYSIS OF ADHESIONS  01/04/2021   Procedure: LAPAROSCOPIC LYSIS OF ADHESIONS;  Surgeon: Stark Klein, MD;  Location: [facility];  Service: General;;  1 HOUR   LAPAROSCOPY N/A 01/04/2021   Procedure: LAPAROSCOPY DIAGNOSTIC;  Surgeon: Stark Klein, MD;  Location: [facility];  Service: General;  Laterality: N/A;  EPIDURAL   PORTACATH PLACEMENT Right 07/02/2020   Procedure: INSERTION PORT-A-CATH, chemotherapy;  Surgeon: Fredirick Maudlin, MD;  Location: [facility] ORS;  Service: General;  Laterality: Right;    SOCIAL HISTORY: Social History   Socioeconomic History   Marital status: Single    Spouse name: Not on file   Number of children: Not on file   Years of education: Not on file   Highest education level: Not on file  Occupational History   Occupation: special needs worker  Tobacco Use   Smoking status: Never   Smokeless tobacco: Never   Vaping Use   Vaping Use: Never used  Substance and Sexual Activity   Alcohol use: Yes    Alcohol/week: 2.0 standard drinks    Types: 2 Cans of beer per week    Comment: beer on weekend   Drug use: No   Sexual activity: Not Currently  Other Topics Concern   Not on file  Social History Narrative   Lives in [place]; with son. 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 Never smoked; beer/iqour over weekends.    Social Determinants of Health   Financial Resource Strain: Not on file  Food Insecurity: Not on file  Transportation Needs: Not on file  Physical Activity: Not on file  Stress: Not on file  Social Connections: Not on file  Intimate Partner Violence: Not on file    FAMILY HISTORY: Family History  Problem Relation Age of Onset   Pancreatic cancer Mother        AT 55 years   Breast cancer Sister        appx-55 years   Pancreatic cancer Maternal Grandmother     ALLERGIES:  is allergic to seasonal ic [cholestatin].  MEDICATIONS:  Current Outpatient Medications  Medication Sig Dispense Refill   Chlorpheniramine Maleate (ALLERGY RELIEF PO) Take 1 tablet by mouth daily as needed (allergies).     CINNAMON PO Take 1 tablet by mouth daily.  diphenoxylate-atropine (LOMOTIL) 2.5-0.025 MG tablet Take 1 tablet by mouth 4 (four) times daily as needed for diarrhea or loose stools. Take it along with immodium 60 tablet 0   GARLIC PO Take 1 tablet by mouth daily.     glipiZIDE (GLUCOTROL) 5 MG tablet Take 1 tablet (5 mg total) by mouth 2 (two) times daily before a meal. 60 tablet 6   lidocaine-prilocaine (EMLA) cream Apply 1 application topically as needed. Apply on the port 30-45 mins prior to port access 30 g 1   losartan (COZAAR) 25 MG tablet Take 25 mg by mouth every morning.      Multiple Vitamins-Minerals (MULTIVITAMIN WITH MINERALS) tablet Take 1 tablet by mouth daily.     ondansetron (ZOFRAN) 8 MG tablet One pill every 8 hours as needed for  nausea/vomitting. 40 tablet 1   potassium chloride SA (KLOR-CON) 20 MEQ tablet 1 pill twice a day 60 tablet 3   pravastatin (PRAVACHOL) 10 MG tablet Take 10 mg by mouth 4 (four) times a week.     prochlorperazine (COMPAZINE) 10 MG tablet Take 1 tablet (10 mg total) by mouth every 6 (six) hours as needed for nausea or vomiting. 40 tablet 1   oxyCODONE (OXY IR/ROXICODONE) 5 MG immediate release tablet Take 1 tablet (5 mg total) by mouth every 6 (six) hours as needed for severe pain. 60 tablet 0   traMADol (ULTRAM) 50 MG tablet Take 1 tablet (50 mg total) by mouth every 8 (eight) hours as needed. Watch out dizziness after taking the pills. Caution with driving (Patient not taking: Reported on 07/20/2021) 45 tablet 0   No current facility-administered medications for this visit.   Facility-Administered Medications Ordered in Other Visits  Medication Dose Route Frequency Provider Last Rate Last Admin   heparin lock flush 100 UNIT/ML injection            heparin lock flush 100 unit/mL  500 Units Intravenous Once Charlaine Dalton R, MD       sodium chloride flush (NS) 0.9 % injection 10 mL  10 mL Intravenous PRN Cammie Sickle, MD   10 mL at 04/20/21 0825      .  PHYSICAL EXAMINATION: ECOG PERFORMANCE STATUS: 0 - Asymptomatic  Vitals:   07/20/21 0840  BP: (!) 141/86  Pulse: (!) 122  Resp: 18  Temp: 99.1 F (37.3 C)    Filed Weights   07/20/21 0840  Weight: 190 lb 3.2 oz (86.3 kg)     Physical Exam HENT:     Head: Normocephalic and atraumatic.     Mouth/Throat:     Pharynx: No oropharyngeal exudate.  Eyes:     Pupils: Pupils are equal, round, and reactive to light.  Cardiovascular:     Rate and Rhythm: Normal rate and regular rhythm.  Pulmonary:     Effort: Pulmonary effort is normal. No respiratory distress.     Breath sounds: Normal breath sounds. No wheezing.  Abdominal:     General: Bowel sounds are normal.     Palpations: Abdomen is soft. There is no mass.      Tenderness: There is no abdominal tenderness. There is no guarding or rebound.  Musculoskeletal:        General: No tenderness. Normal range of motion.     Cervical back: Normal range of motion and neck supple.  Skin:    General: Skin is warm.  Neurological:     Mental Status: She is alert and oriented to person, place, and  time.  Psychiatric:        Mood and Affect: Affect normal.     LABORATORY DATA:  I have reviewed the data as listed Lab Results  Component Value Date   WBC 6.3 07/20/2021   HGB 9.5 (L) 07/20/2021   HCT 30.6 (L) 07/20/2021   MCV 81.6 07/20/2021   PLT 136 (L) 07/20/2021   Recent Labs    06/22/21 0827 07/06/21 0823 07/20/21 0811  NA 134* 132* 135  K 4.6 4.2 3.2*  CL 101 97* 105  CO2 24 23 17*  GLUCOSE 105* 113* 100*  BUN _0 CREATININE 1.10* 1.08* 0.76  CALCIUM 8.4* 8.5* 6.8*  GFRNONAA 55* 57* >60  PROT 7.2 7.1 5.3*  ALBUMIN 3.6 3.2* 2.2*  AST 36 39 74*  ALT 21 26 33  ALKPHOS 116 154* 264*  BILITOT 0.4 0.7 0.6    RADIOGRAPHIC STUDIES: I have personally reviewed the radiological images as listed and agreed with the findings in the report. No results found.   ASSESSMENT & PLAN:   Primary adenocarcinoma of gall bladder [facility]) #Metastatic gallbladder adenocarcinoma-CT scan Oct 11th, 2022-shows progressive disease-new masses in the right hepatic lobe/adjacent to gallbladder with recurrent malignancy.  Also new lesion in the right hepatic lobe concerning for metastasis.  Faint nodularity of omentum/peritoneal disease; periportal adenopathy increased.  Rising tumor marker; CA 19-9- rising.  #Patient currently  on 5-FU CVI+ Onivyde x4 cycles.  I again reviewed with the patient the rising cancer marker ~dec 2022-23,000+.  Again concerning for progressive metastatic disease.  Recommend repeat imaging-ordered today.  Options include: Switching chemotherapy to gemcitabine-Abraxane vs.regorafenib.   # Diarrhea- G-1; imodium/ Lomotil-  improved/STABLE.   # Anemia sec to chemo-hemoglobin today is 8.3; hold off PRBC transfusion.  Monitor closely. Monitor closely. STABLE; check iron studies/ferritin.   # Abdominal distention/abdominal pain- likely malignant ascites; s/p US/paracentsis- worse- improved on oxycdone.   # Blood glucose-s/p P-BF-BG-105  [HbA1c- May 5.5; continue glipizide 5 mg BID- STABLE.   # HTN-  continue with cozaar- STABLE.  # Hypokalemia- 3.8- sec to cisplatin- improved; continue Kdur 20 meQ once a day- STABLE.   # DISPOSITION:  # HOLD chemo today; IVFs 1 lit over 1 hour today; cancel appt for 12/30  # Follow up in 2 weeks- MD; labs- cbc/cmp/ca-19-9; 5FU+onivide; chemo- pump off 48 hours later; CT CAP prior--Dr.B    All questions were answered. The patient knows to call the clinic with any problems, questions or concerns.   Cammie Sickle, MD 07/21/2021 11:30 AM

## 2021-07-20 NOTE — Assessment & Plan Note (Addendum)
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  Also new lesion in the right hepatic lobe concerning for metastasis.  Faint nodularity of omentum/peritoneal disease; periportal adenopathy increased.  Rising tumor marker; CA 19-9- rising.  #Patient currently  on 5-FU CVI+ Onivyde x4 cycles.  I again reviewed with the patient the rising cancer marker ~dec 2022-23,000+.  Again concerning for progressive metastatic disease.  Recommend repeat imaging-ordered today.  Options include: Switching chemotherapy to gemcitabine-Abraxane vs.regorafenib.   # Diarrhea- G-1; imodium/ Lomotil- improved/STABLE.   # Anemia sec to chemo-hemoglobin today is 8.3; hold off PRBC transfusion.  Monitor closely. Monitor closely. STABLE; check iron studies/ferritin.   # Abdominal distention/abdominal pain- likely malignant ascites; s/p US/paracentsis- worse- improved on oxycdone.   # Blood glucose-s/p P-BF-BG-105  [HbA1c- May 5.5; continue glipizide 5 mg BID- STABLE.   # HTN-  continue with cozaar- STABLE.  # Hypokalemia- 3.8- sec to cisplatin- improved; continue Kdur 20 meQ once a day- STABLE.   # DISPOSITION:  # HOLD chemo today; IVFs 1 lit over 1 hour today; cancel appt for 12/30  # Follow up in 2 weeks- MD; labs- cbc/cmp/ca-19-9; 5FU+onivide; chemo- pump off 48 hours later; CT CAP prior--Dr.B

## 2021-07-20 NOTE — Progress Notes (Signed)
Patient reports right side pain that is 6/10 today and last took pain pill last night.  Has a 5 lb weight loss despite having a good appetite.  Has not taken Losartan for 3 days and is in need of a refill, blood pressure 141/86 with HR 122.

## 2021-07-21 ENCOUNTER — Encounter: Payer: Self-pay | Admitting: Internal Medicine

## 2021-07-21 ENCOUNTER — Telehealth: Payer: Self-pay | Admitting: Pharmacist

## 2021-07-21 LAB — CANCER ANTIGEN 19-9: CA 19-9: 39485 U/mL — ABNORMAL HIGH (ref 0–35)

## 2021-07-21 NOTE — Telephone Encounter (Signed)
Oral Oncology Pharmacist Encounter  Received new prescription for Stivarga (regorafenib) for the treatment of stage IV adenocarcinoma gallbladder, planned duration until disease progression or unacceptable drug toxicity.  CMP from 07/20/21 assessed, no relevant lab abnormalities. BP from 07/20/21 slightly elevated, continue to monitor.   Current medication list in Epic reviewed, no DDIs with regorafenib identified.  Evaluated chart and no patient barriers to medication adherence identified.   Prescription has been e-scribed to the [facility] for benefits analysis and approval.  Oral Oncology Clinic will continue to follow for insurance authorization, copayment issues, initial counseling and start date.  Darl Pikes, PharmD, BCPS, BCOP, CPP Hematology/Oncology Clinical Pharmacist Practitioner [place]/[facility]/AP Oral [facility] [phone]  07/21/2021 5:06 PM

## 2021-07-22 ENCOUNTER — Inpatient Hospital Stay: Payer: Medicare Other

## 2021-07-22 ENCOUNTER — Telehealth: Payer: Self-pay | Admitting: Internal Medicine

## 2021-07-22 ENCOUNTER — Other Ambulatory Visit (HOSPITAL_COMMUNITY): Payer: Self-pay

## 2021-07-22 ENCOUNTER — Telehealth: Payer: Self-pay | Admitting: Pharmacist

## 2021-07-22 ENCOUNTER — Encounter: Payer: Self-pay | Admitting: Internal Medicine

## 2021-07-22 ENCOUNTER — Inpatient Hospital Stay
Admission: RE | Admit: 2021-07-22 | Discharge: 2021-07-22 | Disposition: A | Discharge status: Home / Self Care | Payer: Medicare Other | Source: Ambulatory Visit | Attending: Internal Medicine | Admitting: Internal Medicine

## 2021-07-22 ENCOUNTER — Other Ambulatory Visit: Payer: Self-pay

## 2021-07-22 ENCOUNTER — Telehealth: Payer: Self-pay | Admitting: *Deleted

## 2021-07-22 DIAGNOSIS — E11649 Type 2 diabetes mellitus with hypoglycemia without coma: Secondary | ICD-10-CM | POA: Diagnosis not present

## 2021-07-22 DIAGNOSIS — C23 Malignant neoplasm of gallbladder: Secondary | ICD-10-CM

## 2021-07-22 DIAGNOSIS — A419 Sepsis, unspecified organism: Secondary | ICD-10-CM | POA: Diagnosis not present

## 2021-07-22 IMAGING — CT CT CHEST-ABD-PELV W/ CM
2 of 5 series · 11 of 36 positions shown, 12 images · IV contrast (omnipaque)
Comparison: [DATE].

CLINICAL DATA: A 65-year-old female presents for follow-up of
gallbladder cancer post systemic therapy.

EXAM:
CT CHEST, ABDOMEN, AND PELVIS WITH CONTRAST
TECHNIQUE: Multidetector CT imaging of the chest, abdomen and pelvis was
performed following the standard protocol during bolus
administration of intravenous contrast.
CONTRAST:  100mL OMNIPAQUE IOHEXOL 300 MG/ML  SOLN

[Series 2: cap with · axial · 0.81mm/px · z∈[-637,-97]mm · 8 of 132 slices shown, 9 images]
[im 12/132  mediastinal]
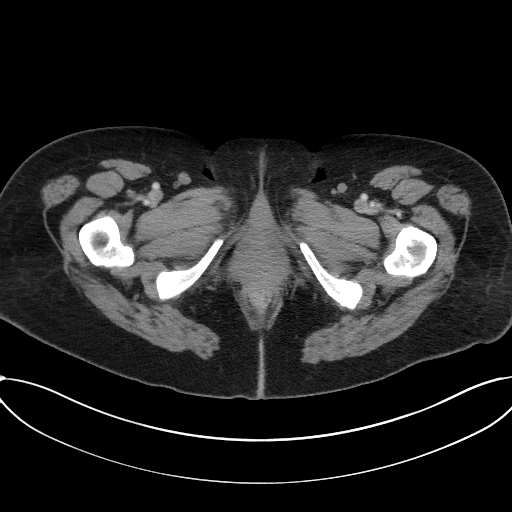
[im 12/132  bone]
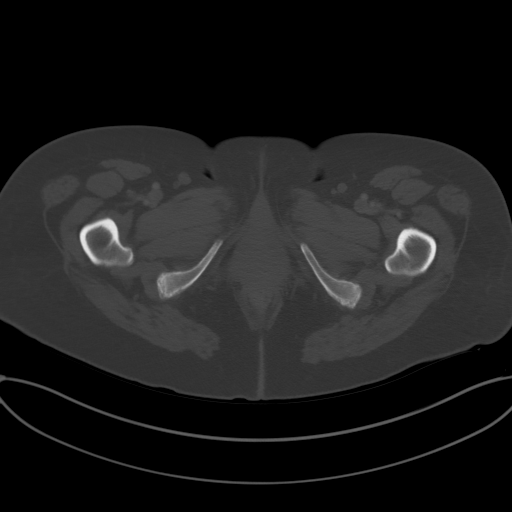
[im 24/132  mediastinal]
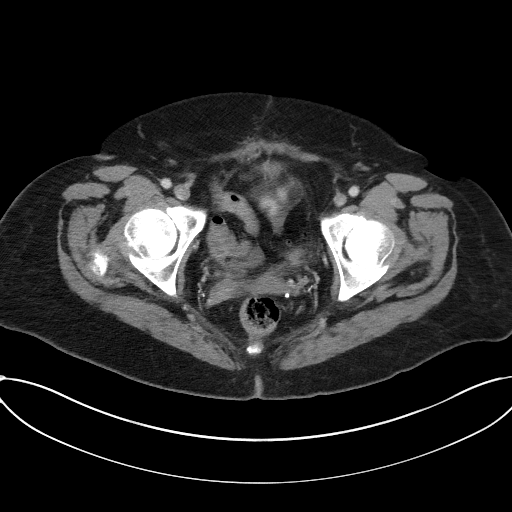
[im 48/132  mediastinal]
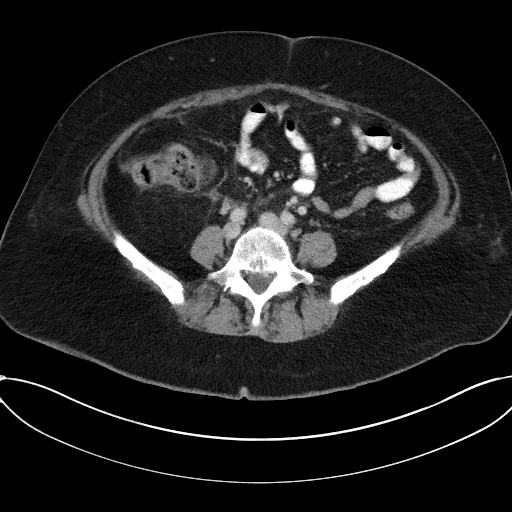
[im 60/132  mediastinal]
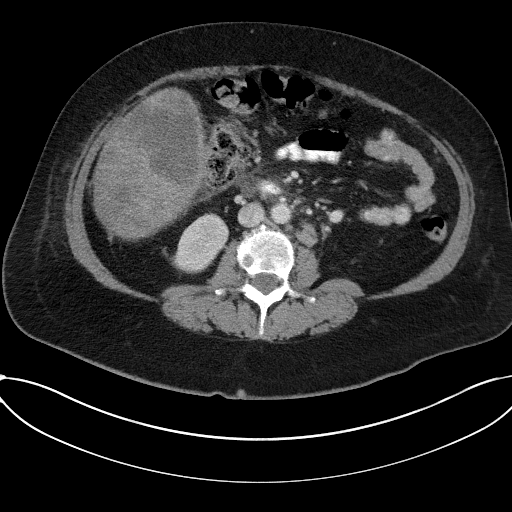
[im 72/132  mediastinal]
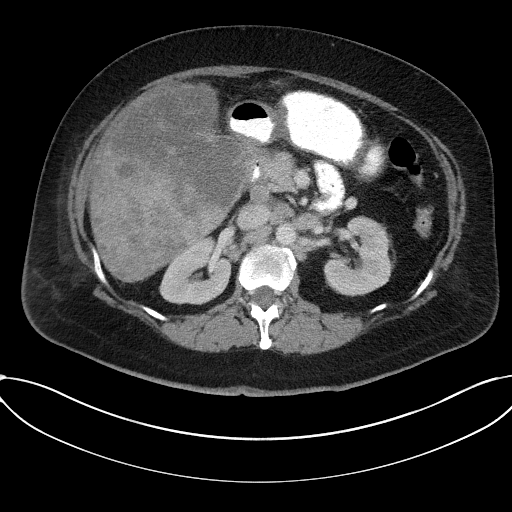
[im 84/132  mediastinal]
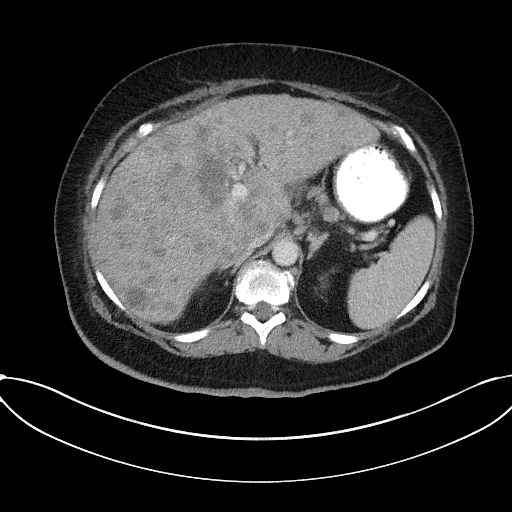
[im 108/132  mediastinal]
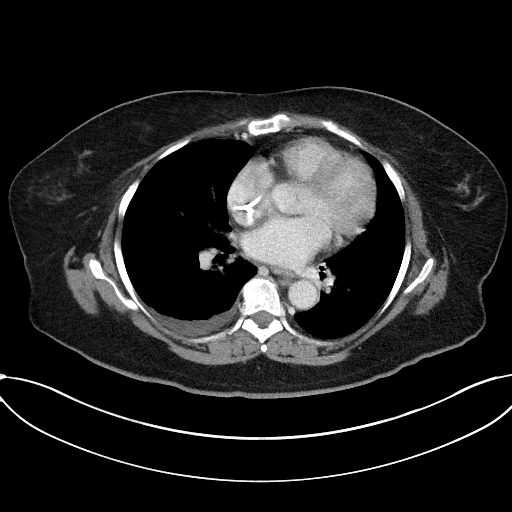
[im 120/132  mediastinal]
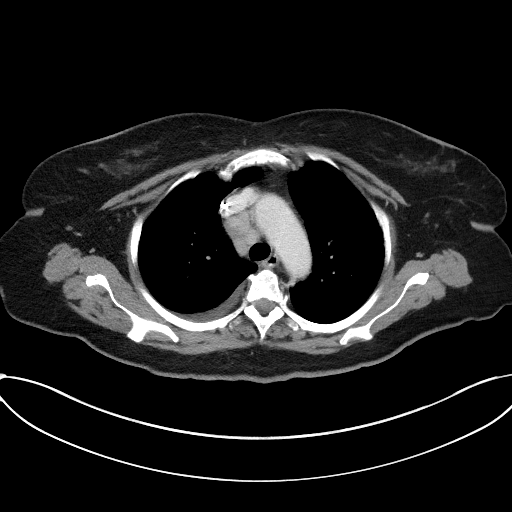

[Series 5: coronals · coronal · 0.89mm/px · 3 of 158 slices shown]
[im 32/158  mediastinal]
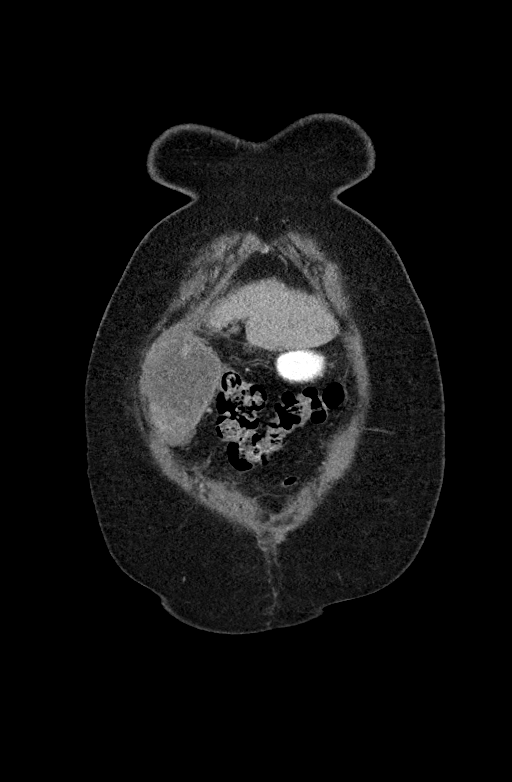
[im 63/158  mediastinal]
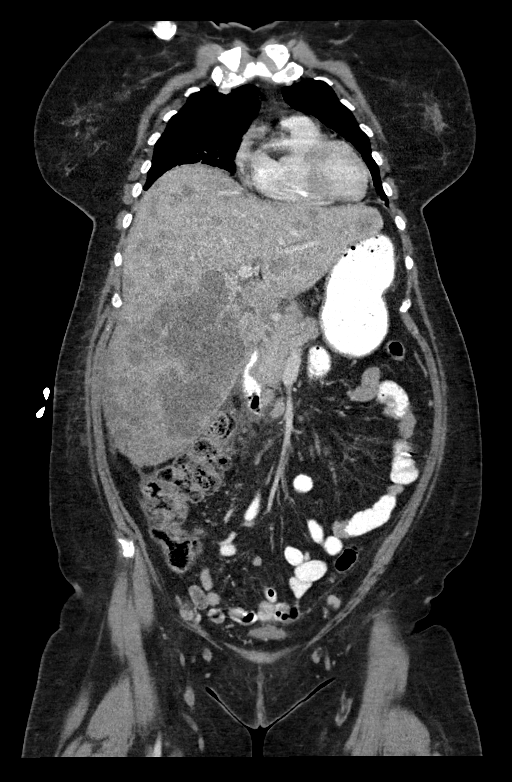
[im 95/158  mediastinal]
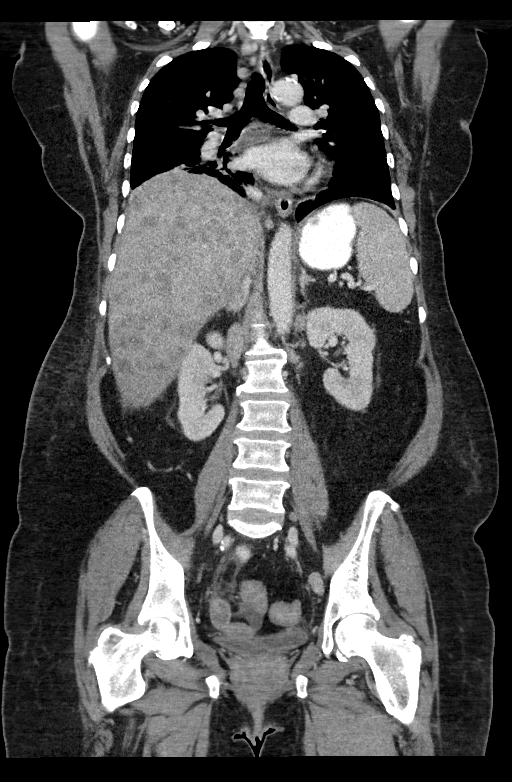

[11 of 36 positions shown; findings below may reference images not displayed]

FINDINGS: CT CHEST FINDINGS

Cardiovascular: RIGHT-sided Port-A-Cath terminates at the lower
aspect of the RIGHT atrium, likely extending into the proximal
portion of the RIGHT ventricle, previously in the inferior vena
cava. Heart size is stable. No pericardial effusion. Central
pulmonary vasculature is unremarkable on venous phase assessment.

Mediastinum/Nodes: Bulky adenopathy throughout the chest including
the thoracic inlet and mediastinum.

(Image [DATE]) 19 mm short axis measurement of a large RIGHT
paratracheal lymph nodes slightly smaller nodes track to the level
of the RIGHT thoracic inlet where there is a moderately enlarged
RIGHT thoracic inlet lymph node (image [DATE]) 16 mm not present on
previous imaging.

The largest lymph node in the RIGHT paratracheal region was 9 mm on
the prior study. No subcarinal adenopathy.

LEFT mediastinal adenopathy just below the thoracic inlet (image
[DATE]) 16 mm 3-4 mm on the prior study.

Posterior mediastinal adenopathy (image [DATE]) 12 mm. Smaller lymph
nodes adjacent to the aorta with rounded morphology tracking from
the retrocrural region.

No hilar adenopathy.  No axillary adenopathy.

Lungs/Pleura: Small RIGHT-sided pleural effusion. Scattered small
pulmonary nodules which are new compared to previous imaging and
scattered randomly throughout the chest. RIGHT upper lobe pulmonary
nodule (image 43) 6 mm nodule in the RIGHT upper lobe. At least 5
additional tiny nodules which are new in the RIGHT upper lobe as
well.

RIGHT middle lobe pulmonary nodule (64/3) 5 mm. At least 1
additional nodule in the RIGHT middle lobe.

RIGHT lower lobe nodule (image 67/3) 3-4 mm.

Signs of RIGHT pleural effusion and basilar airspace disease.

(Image 80/3) 6 mm LEFT lower lobe pulmonary nodule and 3-4
additional pulmonary nodules in the LEFT lower lobe.

Extrapleural soft tissue nodularity potentially related to lymph
nodes (image 43/2) 11 mm adjacent to the head of the LEFT tenth rib.
Smaller similar extrapleural nodules and lymph nodes posterior to
the aorta seen as well in the LEFT chest.

Musculoskeletal: See below for full musculoskeletal details.

CT ABDOMEN PELVIS FINDINGS

Hepatobiliary: Diffuse hepatic metastatic disease has worsened since
the previous exam. Confluent low attenuation in the medial inferior
RIGHT hepatic lobe, hepatic subsegment V (image 64/2) 10 x 8.5 cm,
largest discrete area in this location was previously 5 cm.

Innumerable new hepatic lesions involving both LEFT and RIGHT
hepatic lobe many less than a cm but with numerous, greater than 20
to 30 additional lesions greater than a cm. For instance, (image
37/2) 19 mm LEFT hepatic lobe lesion hepatic subsegment II.

(Image 43/2) 24 mm LEFT hepatic lobe lesion, hepatic subsegment IV.

(Image 46/2) 21 mm lesion in the RIGHT hepatic lobe at the boundary
of hepatic subsegment V and VIII.

Portal vein to the RIGHT hepatic lobe is attenuated, in particular
to the inferior RIGHT hepatic lobe where it is not visualized beyond
the porta hepatis. Superior RIGHT portal vein branches are visible.
LEFT portal is patent. Main portal vein is widely patent as is the
splenic vein.

Pancreas: Nodal disease about the pancreas. No signs of focal
pancreatic lesion, ductal dilation or inflammation.

Spleen: Normal.

Adrenals/Urinary Tract:

Adrenal glands are unremarkable. Symmetric renal enhancement. No
sign of hydronephrosis. No suspicious renal lesion or perinephric
stranding.

Urinary bladder is grossly unremarkable.

Stomach/Bowel: No sign of bowel obstruction or acute bowel finding.
Appendix not visualized. No secondary signs to suggest appendiceal
inflammation by report the patient is post appendectomy.

Vascular/Lymphatic: Bulky adenopathy now seen throughout the
retroperitoneum in addition to the porta hepatis. Porta hepatis
adenopathy shows signs of early necrosis with areas of lower
attenuation.

(Image 50/2) 23 mm short axis measurement of a periportal lymph node
previously 17 mm.

Hepatic gastric adenopathy and celiac adenopathy in the 1-1.5 cm
range.

Juxta crural lymph nodes and retrocaval, intra-aortocaval and LEFT
periaortic lymph nodes ranging in size from less than a cm to 16 mm
short axis (image 62/2) high intra-aortocaval lymph node as an
example. Numerous lymph nodes tracking into the root of the small
bowel mesentery as well (image 70/2) 12 mm.

12 mm LEFT pelvic sidewall lymph node, external iliac lymph node
(image 102/2)

Reproductive: Post hysterectomy.

Other: Small volume ascites in the pelvis. Signs of peritoneal and
omental nodularity (image 90/2) 5 mm nodule in the omentum in the
upper abdomen. Subtle peritoneal thickening and subtle nodularity
seen elsewhere in the peritoneum (image 70/2) along the LEFT
hemiabdomen adjacent to the jejunum and LEFT hemicolon.

Fat containing ventral and periumbilical hernia similar to prior
imaging.

Musculoskeletal: 13 mm metastatic focus in the RIGHT lateral aspect
L3. This measures 9 mm (image 98/5)

Subtle lesion in L4 (image 95/6) this measures approximately 3 mm.
IMPRESSION: 1. Marked interval worsening of disease now with metastatic disease
to the chest involving nodes, lung parenchyma and likely with
developing pleural/extrapleural disease as well.
2. Marked interval worsening of hepatic metastatic disease now with
occlusion of RIGHT portal branches to the inferior RIGHT hepatic
lobe. A large area of low attenuation in the inferior RIGHT hepatic
lobe could reflect a combination of disease and parenchymal
hypoperfusion/infarct. Correlate with any new symptoms of abdominal
pain and suggest attention on follow-up for any signs of developing
necrosis.
3. Worsening of abdominal nodal disease with development of pelvic
nodal disease.
4. Signs of peritoneal disease.
5. Signs of bony metastatic disease, new compared to previous
imaging.
6. Small volume ascites.
7. Chronic low position of the catheter tip from the patient's
Port-A-Cath. Correlate with any dysrhythmia and consider
repositioning as warranted.

These results will be called to the ordering clinician or
representative by the Radiologist Assistant, and communication
documented in the PACS or [REDACTED].

## 2021-07-22 MED ORDER — IOHEXOL 300 MG/ML  SOLN
100.0000 mL | Freq: Once | INTRAMUSCULAR | Status: AC | PRN
  Administered 2021-07-22: 08:00:00 100 mL via INTRAVENOUS

## 2021-07-22 NOTE — Telephone Encounter (Signed)
IMPRESSION: 1. Marked interval worsening of disease now with metastatic disease to the chest involving nodes, lung parenchyma and likely with developing pleural/extrapleural disease as well. 2. Marked interval worsening of hepatic metastatic disease now with occlusion of RIGHT portal branches to the inferior RIGHT hepatic lobe. A large area of low attenuation in the inferior RIGHT hepatic lobe could reflect a combination of disease and parenchymal hypoperfusion/infarct. Correlate with any new symptoms of abdominal pain and suggest attention on follow-up for any signs of developing necrosis. 3. Worsening of abdominal nodal disease with development of pelvic nodal disease. 4. Signs of peritoneal disease. 5. Signs of bony metastatic disease, new compared to previous imaging. 6. Small volume ascites. 7. Chronic low position of the catheter tip from the patient's Port-A-Cath. Correlate with any dysrhythmia and consider repositioning as warranted.   These results will be called to the ordering clinician or representative by the Radiologist Assistant, and communication documented in the PACS or [employer].     Electronically Signed   By: Zetta Bills M.D.   On: 07/22/2021 09:12

## 2021-07-22 NOTE — Telephone Encounter (Signed)
Oral Oncology Pharmacist Encounter   Received notification from [employer] that prior authorization for Stivarga is required.   PA submitted on CMM Key [id number] Status is pending   Oral Oncology Clinic will continue to follow.   Darl Pikes, PharmD, BCPS, [facility] Hematology/Oncology Clinical Pharmacist [facility] Oral [place] [facility] [phone]  07/22/2021 12:24 PM

## 2021-07-22 NOTE — Telephone Encounter (Signed)
Oral Oncology Pharmacist Encounter   Prior Authorization for Kristina Orozco has been approved.     PA# [id number] Effective dates: 07/22/21 through 07/23/22  [place]: [name] will continue to follow.   Darl Pikes, PharmD, BCPS. BCOP Hematology/Oncology Clinical Pharmacist [facility] Oral Chemotherapy [facility] [phone]  07/22/2021 2:07 PM

## 2021-07-22 NOTE — Telephone Encounter (Signed)
Spoke to patient regarding results of the CT scan shows progressive disease.  Also discussed regarding Stivarga.  Discussed the mechanism of the drug/also palliative nature of the treatment.  Kristina Orozco has been approved by insurance awaiting co-pay assistance.  Alysson-I would appreciate if you could call the patient on Jan 3rd-to give more information about Stivarga.  I would recommend dose escalation based on Re-Dos study/ for colon cancer.  Patient can get started on Stivarga when medication available.  C-please cancel the infusion appointment on Jan 11th/13th-keep lab MD appointments.

## 2021-07-22 NOTE — Telephone Encounter (Signed)
MD will call patient.

## 2021-07-22 NOTE — Telephone Encounter (Signed)
Called report, the report has not crossed over into EPIC yet There is an infarct in the liver with worsening metastatic diseasae in the right hepatic lobe.

## 2021-07-24 ENCOUNTER — Encounter: Payer: Self-pay | Admitting: Emergency Medicine

## 2021-07-24 ENCOUNTER — Other Ambulatory Visit: Payer: Self-pay

## 2021-07-24 ENCOUNTER — Emergency Department: Payer: Medicare Other

## 2021-07-24 ENCOUNTER — Inpatient Hospital Stay
Admission: EM | Admit: 2021-07-24 | Discharge: 2021-08-24 | DRG: 871 | Disposition: E | Payer: Medicare Other | Attending: Internal Medicine | Admitting: Internal Medicine

## 2021-07-24 DIAGNOSIS — E872 Acidosis, unspecified: Secondary | ICD-10-CM | POA: Diagnosis present

## 2021-07-24 DIAGNOSIS — R7401 Elevation of levels of liver transaminase levels: Secondary | ICD-10-CM | POA: Diagnosis not present

## 2021-07-24 DIAGNOSIS — Z515 Encounter for palliative care: Secondary | ICD-10-CM

## 2021-07-24 DIAGNOSIS — Z9049 Acquired absence of other specified parts of digestive tract: Secondary | ICD-10-CM | POA: Diagnosis not present

## 2021-07-24 DIAGNOSIS — D649 Anemia, unspecified: Secondary | ICD-10-CM | POA: Diagnosis not present

## 2021-07-24 DIAGNOSIS — J90 Pleural effusion, not elsewhere classified: Secondary | ICD-10-CM | POA: Diagnosis present

## 2021-07-24 DIAGNOSIS — C7951 Secondary malignant neoplasm of bone: Secondary | ICD-10-CM | POA: Diagnosis present

## 2021-07-24 DIAGNOSIS — C23 Malignant neoplasm of gallbladder: Secondary | ICD-10-CM | POA: Diagnosis present

## 2021-07-24 DIAGNOSIS — N179 Acute kidney failure, unspecified: Secondary | ICD-10-CM

## 2021-07-24 DIAGNOSIS — Z90710 Acquired absence of both cervix and uterus: Secondary | ICD-10-CM

## 2021-07-24 DIAGNOSIS — R4701 Aphasia: Secondary | ICD-10-CM | POA: Diagnosis not present

## 2021-07-24 DIAGNOSIS — D6959 Other secondary thrombocytopenia: Secondary | ICD-10-CM | POA: Diagnosis present

## 2021-07-24 DIAGNOSIS — E11649 Type 2 diabetes mellitus with hypoglycemia without coma: Secondary | ICD-10-CM | POA: Diagnosis present

## 2021-07-24 DIAGNOSIS — R6520 Severe sepsis without septic shock: Secondary | ICD-10-CM | POA: Diagnosis present

## 2021-07-24 DIAGNOSIS — C7800 Secondary malignant neoplasm of unspecified lung: Secondary | ICD-10-CM | POA: Diagnosis not present

## 2021-07-24 DIAGNOSIS — A419 Sepsis, unspecified organism: Principal | ICD-10-CM | POA: Diagnosis present

## 2021-07-24 DIAGNOSIS — C787 Secondary malignant neoplasm of liver and intrahepatic bile duct: Secondary | ICD-10-CM | POA: Diagnosis present

## 2021-07-24 DIAGNOSIS — K763 Infarction of liver: Secondary | ICD-10-CM | POA: Diagnosis present

## 2021-07-24 DIAGNOSIS — D696 Thrombocytopenia, unspecified: Secondary | ICD-10-CM

## 2021-07-24 DIAGNOSIS — C7989 Secondary malignant neoplasm of other specified sites: Secondary | ICD-10-CM | POA: Diagnosis present

## 2021-07-24 DIAGNOSIS — E871 Hypo-osmolality and hyponatremia: Secondary | ICD-10-CM | POA: Diagnosis present

## 2021-07-24 DIAGNOSIS — R6510 Systemic inflammatory response syndrome (SIRS) of non-infectious origin without acute organ dysfunction: Secondary | ICD-10-CM | POA: Diagnosis not present

## 2021-07-24 DIAGNOSIS — Z20822 Contact with and (suspected) exposure to covid-19: Secondary | ICD-10-CM | POA: Diagnosis present

## 2021-07-24 DIAGNOSIS — C799 Secondary malignant neoplasm of unspecified site: Secondary | ICD-10-CM | POA: Diagnosis not present

## 2021-07-24 DIAGNOSIS — R188 Other ascites: Secondary | ICD-10-CM | POA: Diagnosis present

## 2021-07-24 DIAGNOSIS — J309 Allergic rhinitis, unspecified: Secondary | ICD-10-CM | POA: Diagnosis present

## 2021-07-24 DIAGNOSIS — Z7189 Other specified counseling: Secondary | ICD-10-CM | POA: Diagnosis not present

## 2021-07-24 DIAGNOSIS — C782 Secondary malignant neoplasm of pleura: Secondary | ICD-10-CM | POA: Diagnosis not present

## 2021-07-24 DIAGNOSIS — E875 Hyperkalemia: Secondary | ICD-10-CM

## 2021-07-24 DIAGNOSIS — R109 Unspecified abdominal pain: Secondary | ICD-10-CM | POA: Diagnosis not present

## 2021-07-24 DIAGNOSIS — G9340 Encephalopathy, unspecified: Secondary | ICD-10-CM | POA: Diagnosis present

## 2021-07-24 DIAGNOSIS — K7200 Acute and subacute hepatic failure without coma: Secondary | ICD-10-CM | POA: Diagnosis present

## 2021-07-24 DIAGNOSIS — I10 Essential (primary) hypertension: Secondary | ICD-10-CM | POA: Diagnosis present

## 2021-07-24 DIAGNOSIS — R160 Hepatomegaly, not elsewhere classified: Secondary | ICD-10-CM | POA: Diagnosis present

## 2021-07-24 DIAGNOSIS — Z800 Family history of malignant neoplasm of digestive organs: Secondary | ICD-10-CM

## 2021-07-24 DIAGNOSIS — E162 Hypoglycemia, unspecified: Secondary | ICD-10-CM

## 2021-07-24 DIAGNOSIS — D511 Vitamin B12 deficiency anemia due to selective vitamin B12 malabsorption with proteinuria: Secondary | ICD-10-CM | POA: Diagnosis not present

## 2021-07-24 LAB — COMPREHENSIVE METABOLIC PANEL
ALT: 148 U/L — ABNORMAL HIGH (ref 0–44)
AST: 493 U/L — ABNORMAL HIGH (ref 15–41)
Albumin: 2.4 g/dL — ABNORMAL LOW (ref 3.5–5.0)
Alkaline Phosphatase: 522 U/L — ABNORMAL HIGH (ref 38–126)
Anion gap: 19 — ABNORMAL HIGH (ref 5–15)
BUN: 47 mg/dL — ABNORMAL HIGH (ref 8–23)
CO2: 16 mmol/L — ABNORMAL LOW (ref 22–32)
Calcium: 8.7 mg/dL — ABNORMAL LOW (ref 8.9–10.3)
Chloride: 97 mmol/L — ABNORMAL LOW (ref 98–111)
Creatinine, Ser: 2.28 mg/dL — ABNORMAL HIGH (ref 0.44–1.00)
GFR, Estimated: 23 mL/min — ABNORMAL LOW (ref >60)
Glucose, Bld: 116 mg/dL — ABNORMAL HIGH (ref 70–99)
Potassium: 6 mmol/L — ABNORMAL HIGH (ref 3.5–5.1)
Sodium: 132 mmol/L — ABNORMAL LOW (ref 135–145)
Total Bilirubin: 3.5 mg/dL — ABNORMAL HIGH (ref 0.3–1.2)
Total Protein: 6.7 g/dL (ref 6.5–8.1)

## 2021-07-24 LAB — CBC WITH DIFFERENTIAL/PLATELET
Abs Immature Granulocytes: 1.3 10*3/uL — ABNORMAL HIGH (ref 0.00–0.07)
Basophils Absolute: 0.3 10*3/uL — ABNORMAL HIGH (ref 0.0–0.1)
Basophils Relative: 1 %
Eosinophils Absolute: 0 10*3/uL (ref 0.0–0.5)
Eosinophils Relative: 0 %
HCT: 30.4 % — ABNORMAL LOW (ref 36.0–46.0)
Hemoglobin: 9.2 g/dL — ABNORMAL LOW (ref 12.0–15.0)
Lymphocytes Relative: 8 %
Lymphs Abs: 2.6 10*3/uL (ref 0.7–4.0)
MCH: 24.7 pg — ABNORMAL LOW (ref 26.0–34.0)
MCHC: 30.3 g/dL (ref 30.0–36.0)
MCV: 81.7 fL (ref 80.0–100.0)
Metamyelocytes Relative: 2 %
Monocytes Absolute: 1 10*3/uL (ref 0.1–1.0)
Monocytes Relative: 3 %
Myelocytes: 2 %
Neutro Abs: 26.9 10*3/uL — ABNORMAL HIGH (ref 1.7–7.7)
Neutrophils Relative %: 84 %
Platelets: 85 10*3/uL — ABNORMAL LOW (ref 150–400)
RBC: 3.72 MIL/uL — ABNORMAL LOW (ref 3.87–5.11)
RDW: 22.1 % — ABNORMAL HIGH (ref 11.5–15.5)
Smear Review: DECREASED
WBC: 32 10*3/uL — ABNORMAL HIGH (ref 4.0–10.5)
nRBC: 1 % — ABNORMAL HIGH (ref 0.0–0.2)
nRBC: 1 /100 WBC — ABNORMAL HIGH

## 2021-07-24 LAB — PROTIME-INR
INR: 3.5 — ABNORMAL HIGH (ref 0.8–1.2)
Prothrombin Time: 35.5 seconds — ABNORMAL HIGH (ref 11.4–15.2)

## 2021-07-24 LAB — CBG MONITORING, ED
Glucose-Capillary: 100 mg/dL — ABNORMAL HIGH (ref 70–99)
Glucose-Capillary: 102 mg/dL — ABNORMAL HIGH (ref 70–99)
Glucose-Capillary: 107 mg/dL — ABNORMAL HIGH (ref 70–99)
Glucose-Capillary: 142 mg/dL — ABNORMAL HIGH (ref 70–99)
Glucose-Capillary: 179 mg/dL — ABNORMAL HIGH (ref 70–99)
Glucose-Capillary: 59 mg/dL — ABNORMAL LOW (ref 70–99)
Glucose-Capillary: 69 mg/dL — ABNORMAL LOW (ref 70–99)
Glucose-Capillary: 92 mg/dL (ref 70–99)
Glucose-Capillary: 96 mg/dL (ref 70–99)
Glucose-Capillary: 97 mg/dL (ref 70–99)

## 2021-07-24 LAB — APTT: aPTT: 48 seconds — ABNORMAL HIGH (ref 24–36)

## 2021-07-24 IMAGING — DX DG CHEST 1V PORT
1 series · 1 of 1 positions shown · non-contrast
Comparison: CT chest [DATE], chest x-ray [DATE]

CLINICAL DATA: HYPOGLYCEMIA, weakness, see recent CT chest

EXAM:
PORTABLE CHEST 1 VIEW

[chest ap]
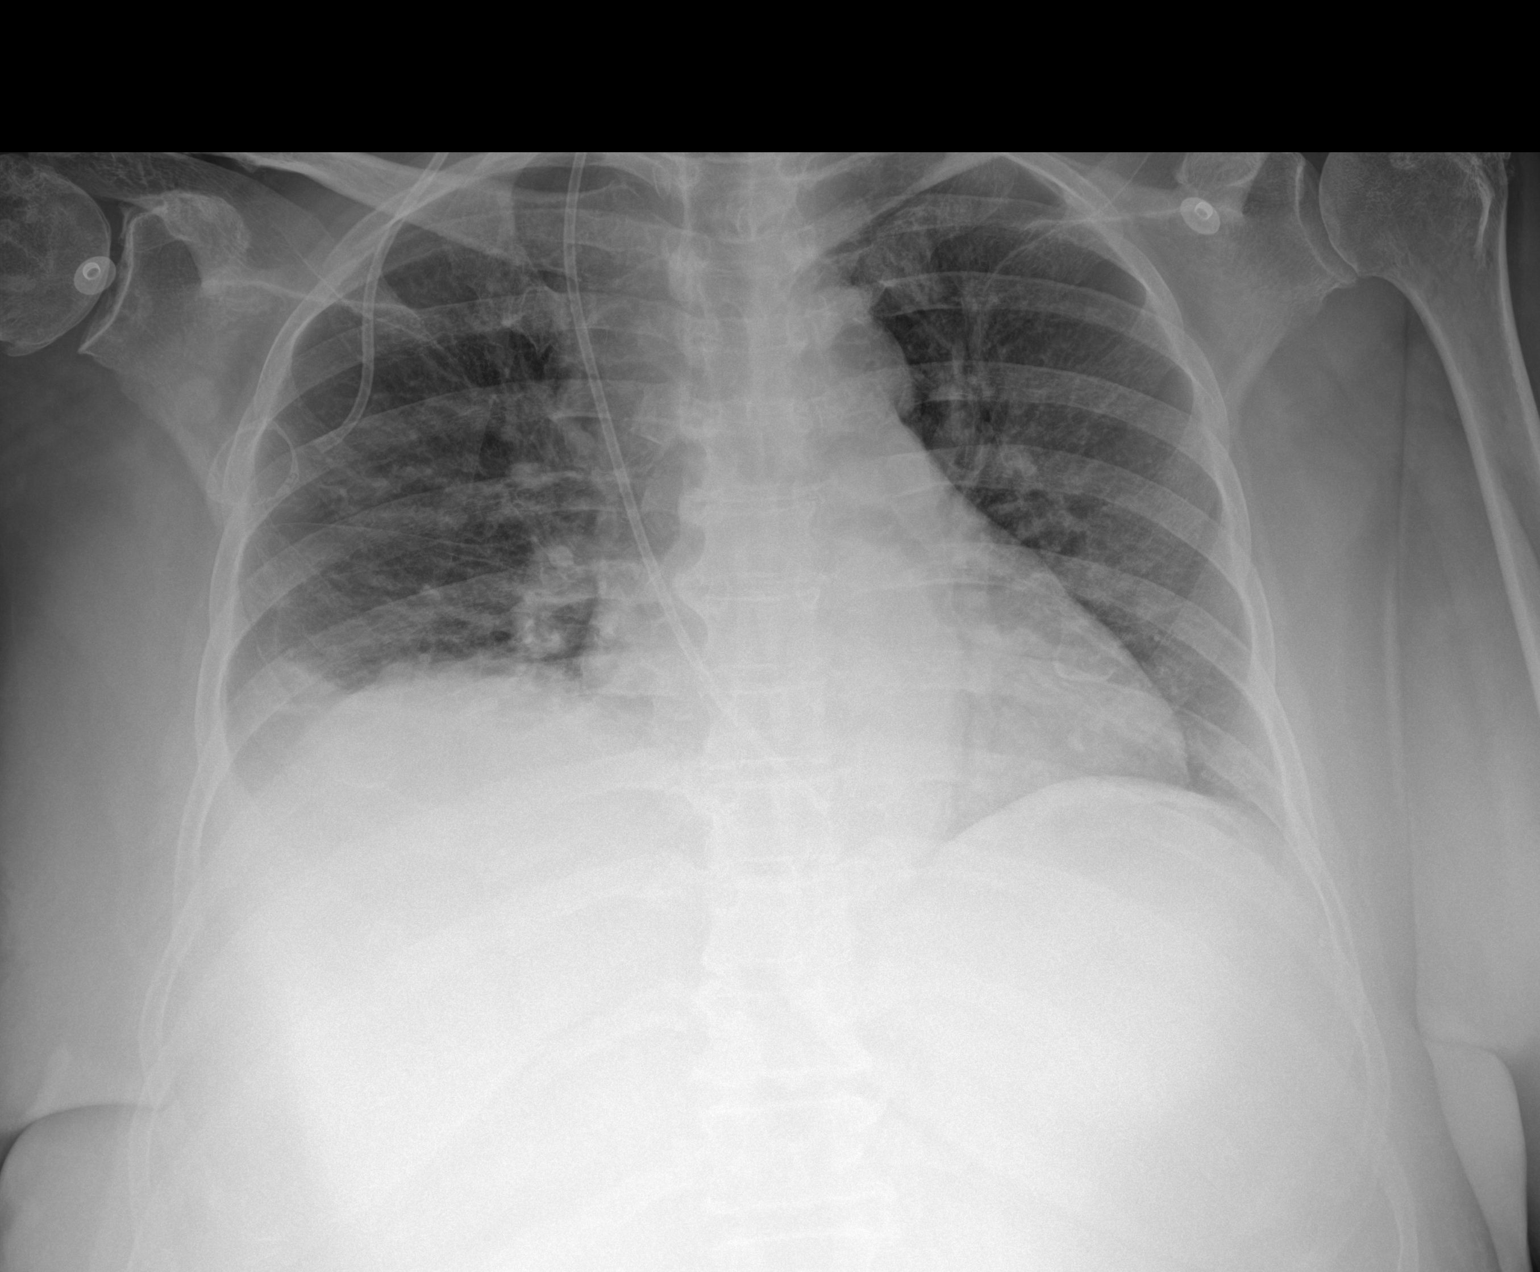

[1 of 1 positions shown; findings below may reference images not displayed]

FINDINGS: Similar-appearing right chest wall Port-A-Cath with tip overlying
the expected region of the left atrium.

The heart and mediastinal contours are unchanged.

Elevated right hemidiaphragm with streaky airspace opacities at the
right base likely representing atelectasis. Otherwise no focal
consolidation. No pulmonary edema. Persistent trace right pleural
effusion. No left pleural effusion. No pneumothorax.

No acute osseous abnormality.
IMPRESSION: 1. Persistent trace right pleural effusion in the setting of an
elevated right hemidiaphragm and right basilar atelectasis.
2. Similar-appearing right chest wall Port-A-Cath with tip overlying
the expected region of the left atrium. Correlate with any
dysrhythmia and consider
repositioning/replacing as needed.

## 2021-07-24 IMAGING — CT CT RENAL STONE PROTOCOL
2 of 4 series · 15 of 46 positions shown, 17 images · non-contrast
Comparison: CT the chest, abdomen and pelvis [DATE].

CLINICAL DATA: 65-year-old female with history of renal ischemia or
infarction.

EXAM:
CT ABDOMEN AND PELVIS WITHOUT CONTRAST
TECHNIQUE: Multidetector CT imaging of the abdomen and pelvis was performed
following the standard protocol without IV contrast.

[Series 2: stone full standard · axial · 0.91mm/px · z∈[-1274,-824]mm · 12 of 108 slices shown, 14 images]
[im 9/108  soft-tissue]
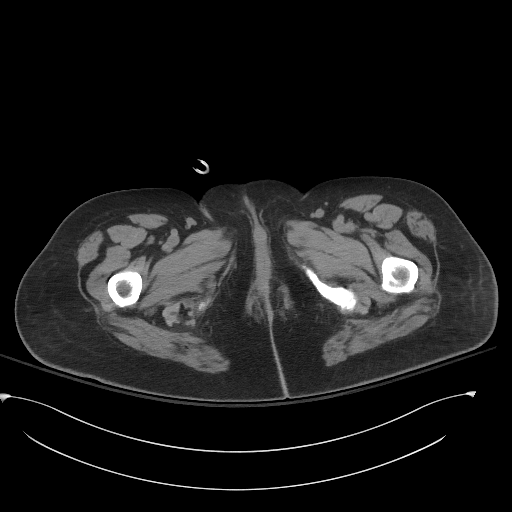
[im 9/108  bone]
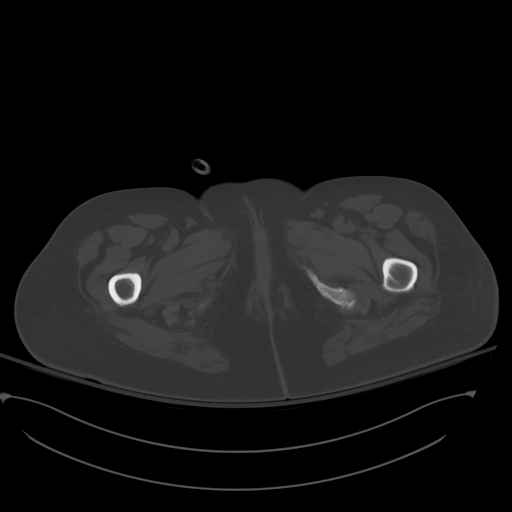
[im 17/108  soft-tissue]
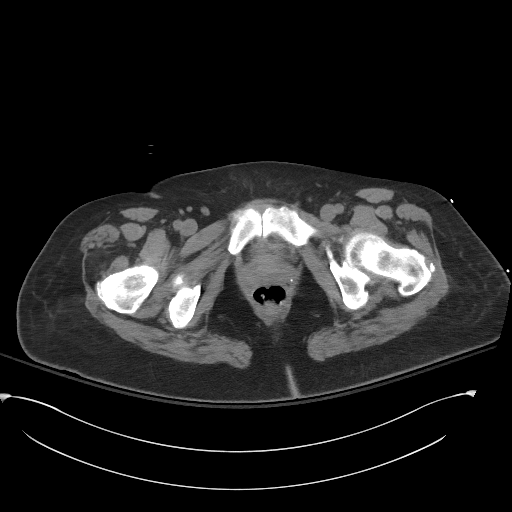
[im 25/108  soft-tissue]
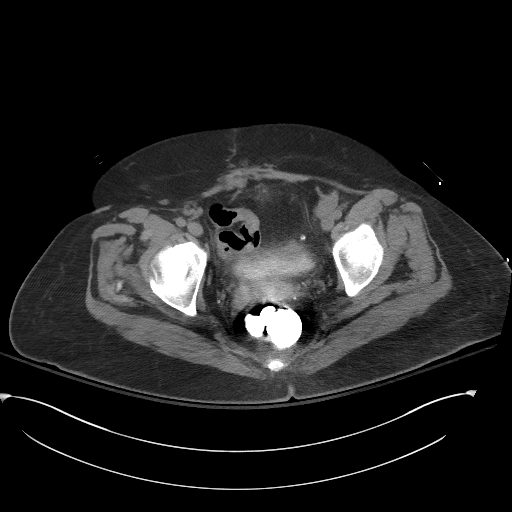
[im 33/108  soft-tissue]
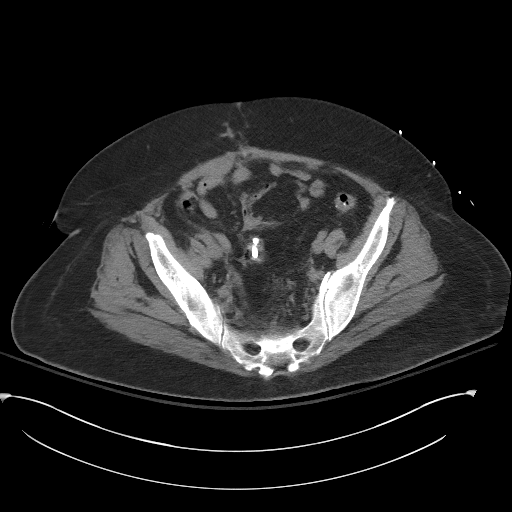
[im 42/108  soft-tissue]
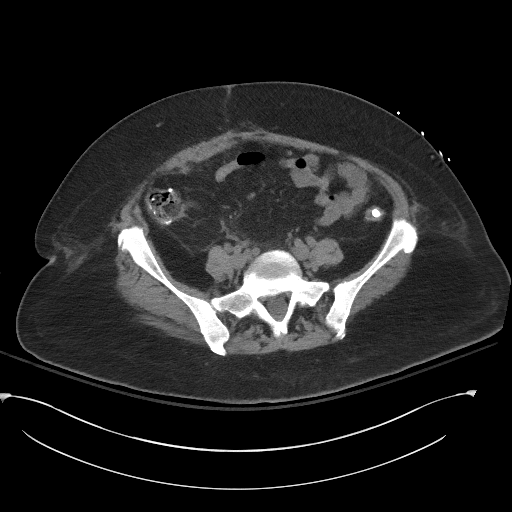
[im 50/108  soft-tissue]
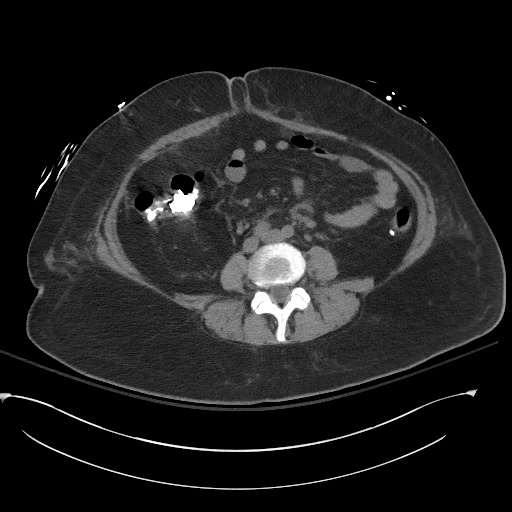
[im 58/108  soft-tissue]
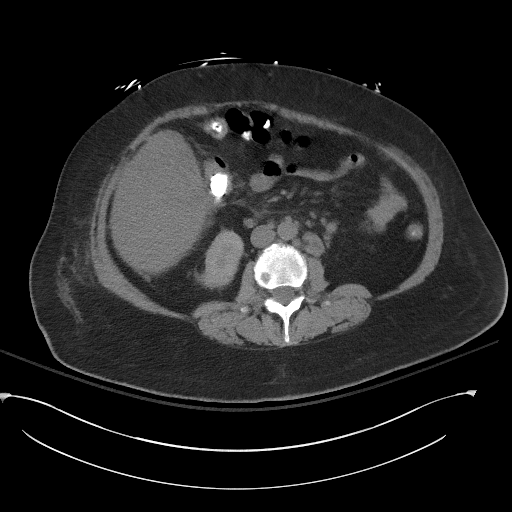
[im 66/108  soft-tissue]
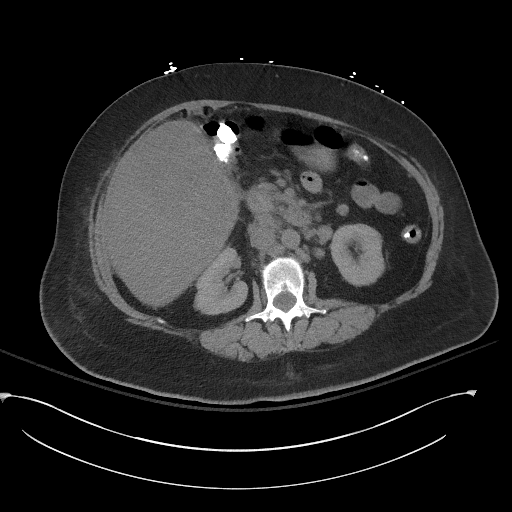
[im 75/108  soft-tissue]
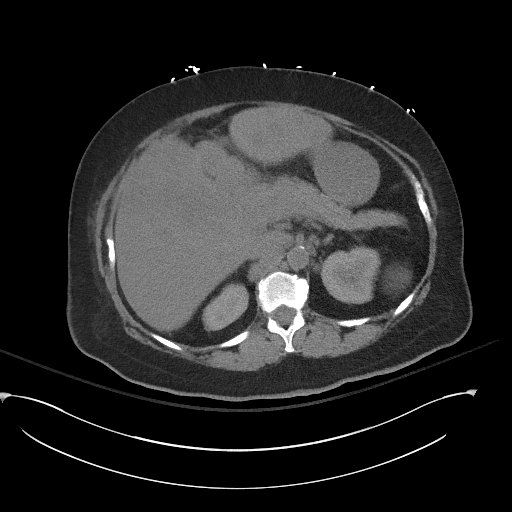
[im 75/108  bone]
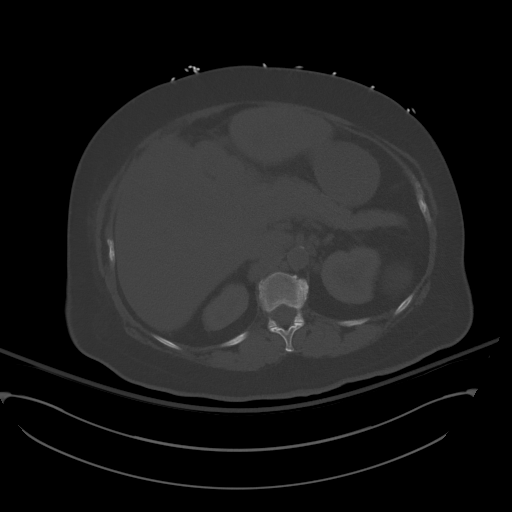
[im 83/108  soft-tissue]
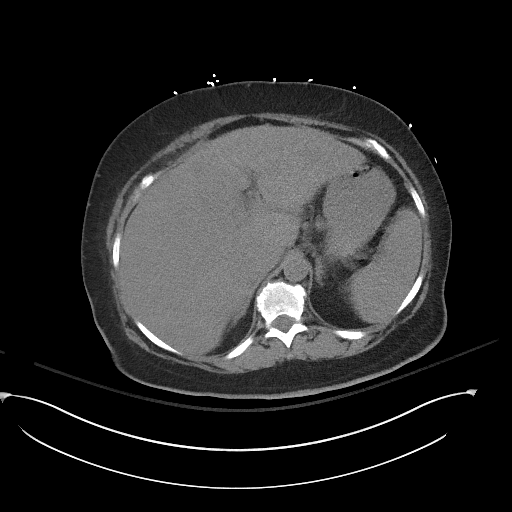
[im 91/108  soft-tissue]
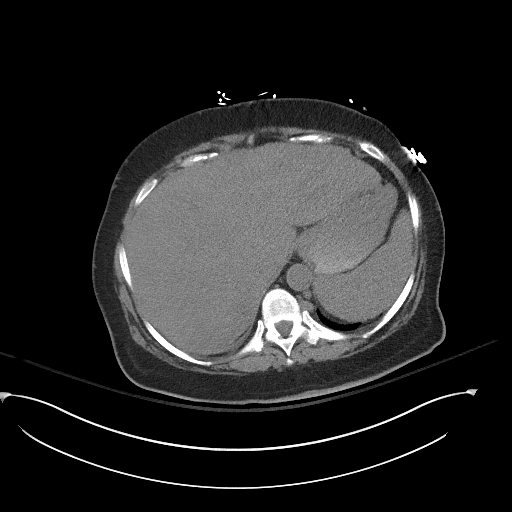
[im 99/108  soft-tissue]
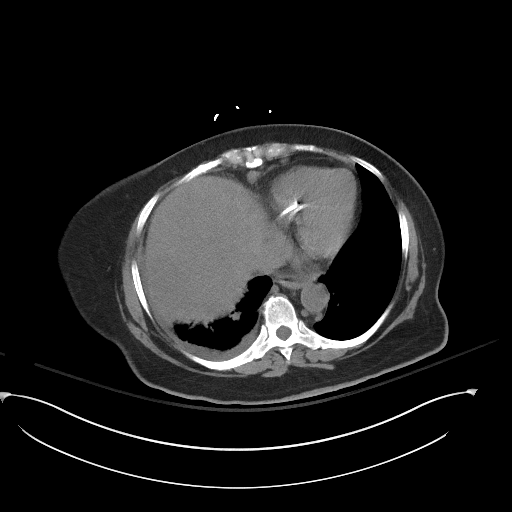

[Series 5: coronal · coronal · 0.91mm/px · 3 of 163 slices shown]
[im 55/163  soft-tissue]
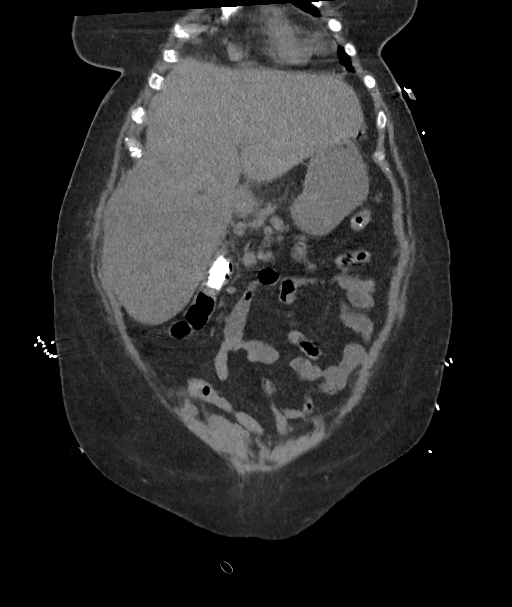
[im 73/163  soft-tissue]
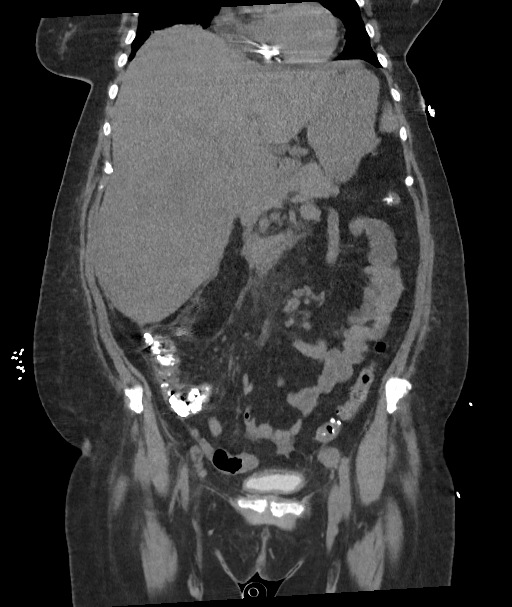
[im 91/163  soft-tissue]
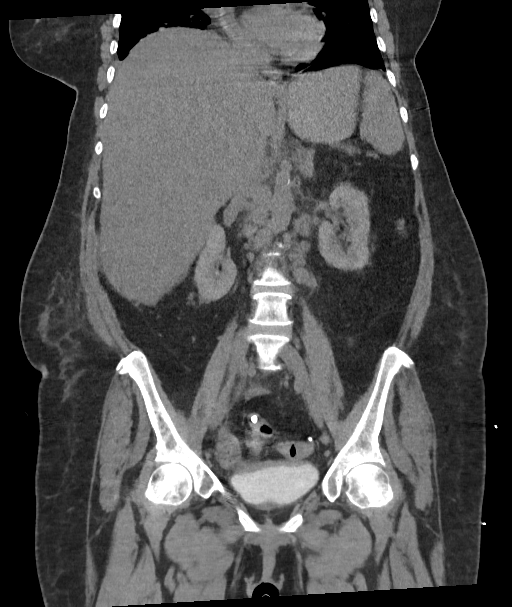

[15 of 46 positions shown; findings below may reference images not displayed]

FINDINGS: Lower chest: Trace right pleural effusion lying dependently with
some passive subsegmental atelectasis in the right lower lobe.
Aortic atherosclerosis. Central venous catheter terminating in the
right ventricle.

Hepatobiliary: Liver is diffusely enlarged and heterogeneous in
appearance. Multiple hepatic metastatic lesions noted on the recent
prior examination are not as well demonstrated on today's
noncontrast CT examination. Status post cholecystectomy.

Pancreas: No definite pancreatic mass or peripancreatic fluid
collections or inflammatory changes are noted on today's noncontrast
CT examination.

Spleen: Unremarkable.

Adrenals/Urinary Tract: 2.0 x 1.2 cm low-attenuation lesion in the
lower pole of the left kidney, compatible with a simple cyst. Right
kidney and bilateral adrenal glands are normal in appearance. No
hydroureteronephrosis. Urinary bladder is filled with iodinated
contrast material, but otherwise unremarkable in appearance.

Stomach/Bowel: The appearance of the stomach is normal. No
pathologic dilatation of small bowel or colon. Numerous colonic
diverticulae are noted, without surrounding inflammatory changes to
suggest an acute diverticulitis at this time. The appendix is not
confidently identified and may be surgically absent. Regardless,
there are no inflammatory changes noted adjacent to the cecum to
suggest the presence of an acute appendicitis at this time.

Vascular/Lymphatic: Aortic atherosclerosis. Multiple enlarged and
borderline enlarged retroperitoneal, upper abdominal and pelvic
lymph nodes. The largest of these is in the left para-aortic nodal
station (axial image 51 of series 2) measuring 1.7 cm in short axis.
The largest upper abdominal lymph node measures 1.4 cm in short axis
adjacent to the lesser curvature of the antrum of the stomach.

Reproductive: Status post hysterectomy. Ovaries are not confidently
identified may be surgically absent or atrophic.

Other: Multiple tiny peritoneal nodules again noted. No significant
volume of ascites. No pneumoperitoneum.

Musculoskeletal: There are no aggressive appearing lytic or blastic
lesions noted in the visualized portions of the skeleton.
IMPRESSION: 1. Widespread metastatic disease in the abdomen and pelvis, most
notably throughout the liver, poorly evaluated on today's
noncontrast CT examination.
2. Trace right pleural effusion lying dependently.
3. Colonic diverticulosis without evidence of acute diverticulitis
at this time.
4. Aortic atherosclerosis.
5. Additional incidental findings, as above.

## 2021-07-24 MED ORDER — ADULT MULTIVITAMIN W/MINERALS CH: 1.0000 | ORAL_TABLET | Freq: Every day | ORAL | Status: DC

## 2021-07-24 MED ORDER — SODIUM CHLORIDE 0.9 % IV BOLUS
500.0000 mL | Freq: Once | INTRAVENOUS | Status: AC
  Administered 2021-07-24: 500 mL via INTRAVENOUS

## 2021-07-24 MED ORDER — DEXTROSE 50 % IV SOLN: 25.0000 g | INTRAVENOUS | Status: DC | PRN

## 2021-07-24 MED ORDER — MORPHINE SULFATE (PF) 2 MG/ML IV SOLN
2.0000 mg | Freq: Once | INTRAVENOUS | Status: AC
  Administered 2021-07-24: 2 mg via INTRAVENOUS
  Filled 2021-07-24: qty 1

## 2021-07-24 MED ORDER — SENNOSIDES-DOCUSATE SODIUM 8.6-50 MG PO TABS: 1.0000 | ORAL_TABLET | Freq: Every evening | ORAL | Status: DC | PRN

## 2021-07-24 MED ORDER — DEXTROSE 50 % IV SOLN: 1.0000 | Freq: Once | INTRAVENOUS | Status: AC

## 2021-07-24 MED ORDER — DEXTROSE-NACL 5-0.9 % IV SOLN: INTRAVENOUS | Status: DC

## 2021-07-24 MED ORDER — ENOXAPARIN SODIUM 30 MG/0.3ML IJ SOSY: 30.0000 mg | PREFILLED_SYRINGE | INTRAMUSCULAR | Status: DC

## 2021-07-24 MED ORDER — ALUM & MAG HYDROXIDE-SIMETH 200-200-20 MG/5ML PO SUSP: 60.0000 mL | ORAL | Status: DC | PRN

## 2021-07-24 MED ORDER — GUAIFENESIN-DM 100-10 MG/5ML PO SYRP: 10.0000 mL | ORAL_SOLUTION | ORAL | Status: DC | PRN

## 2021-07-24 MED ORDER — PROCHLORPERAZINE MALEATE 10 MG PO TABS
10.0000 mg | ORAL_TABLET | Freq: Four times a day (QID) | ORAL | Status: DC | PRN
  Filled 2021-07-24: qty 1

## 2021-07-24 MED ORDER — DIPHENOXYLATE-ATROPINE 2.5-0.025 MG PO TABS: 1.0000 | ORAL_TABLET | Freq: Four times a day (QID) | ORAL | Status: DC | PRN

## 2021-07-24 MED ORDER — MORPHINE SULFATE (PF) 2 MG/ML IV SOLN
2.0000 mg | INTRAVENOUS | Status: DC | PRN
  Administered 2021-07-25: 4 mg via INTRAVENOUS
  Filled 2021-07-24: qty 2
  Filled 2021-07-24: qty 1

## 2021-07-24 MED ORDER — DEXTROSE 50 % IV SOLN
INTRAVENOUS | Status: AC
  Administered 2021-07-24: 50 mL via INTRAVENOUS
  Filled 2021-07-24: qty 50

## 2021-07-24 MED ORDER — HYDROCORTISONE (PERIANAL) 2.5 % EX CREA
1.0000 "application " | TOPICAL_CREAM | Freq: Two times a day (BID) | CUTANEOUS | Status: DC | PRN
  Filled 2021-07-24: qty 28.35

## 2021-07-24 MED ORDER — ONDANSETRON HCL 4 MG/2ML IJ SOLN
4.0000 mg | INTRAMUSCULAR | Status: AC
  Administered 2021-07-24: 4 mg via INTRAVENOUS
  Filled 2021-07-24: qty 2

## 2021-07-24 NOTE — Assessment & Plan Note (Signed)
Likely d/t liver failure / ascites Not critical, NS IV fluids, check in AM

## 2021-07-24 NOTE — ED Triage Notes (Signed)
Pt via EMS from home. Pt c/o R lower pain. EMS states that when they checked her CBG I eas 54 initially. EMS was unable to keep it up with OJ. EMS started D10 and it increased to 177 but dropped down to 110. CBG on arrival 62. Restarted D10 at this time. Pt is a cancer pt and is currently on chemotherapy. Denies any confusion or AMS. Pt is A&OX4 and NAD.

## 2021-07-24 NOTE — Assessment & Plan Note (Signed)
Trend AM labs but expect will worsen Pain control is priority given the infarct

## 2021-07-24 NOTE — Assessment & Plan Note (Signed)
Spoke with patient twice - once without family present. Made clear that at this stage of metastatic disease and with evidence of liver failure, tachycardia, hypoglycemia, kidney injury, hyperkalemia, bleeding risk etc she is very high risk for cardiac/general decompensation. Made clear that in her condition, CPR / intubation / life support is not going to cure her underlying illness which is I believe a terminal / end stage condition at this point. Made clear that CPR / resuscitation is invasive, painful, extremely unlikely to result in successful resuscitation or result in good QOL. Made clear that the alternative is to treat pain and anxiety, and to allow nature to take its course and allow for peaceful passing. She would like to talk to her family tomorrow. She made clear that she would want CPR / resuscitation attempted even if it will not result in saving her life. Palliative consult placed. Multiple caring family members are involved. Patient currently has capacity to give informed consent/refusal.

## 2021-07-24 NOTE — Assessment & Plan Note (Signed)
Likely d/t AKI and tissue catabolism  EKG tracing reviewed: sinus tach, normal T waves Cannot give insulin in setting of hypoglycemia but this would not correct underlying problem  High risk for decompensation

## 2021-07-24 NOTE — Assessment & Plan Note (Signed)
Trending down Likely d/t liver failure Holding Rx VTE Ppx

## 2021-07-24 NOTE — ED Notes (Signed)
Dr. Sheppard Coil at bedside having extensive end of life conversation with pt. With family members and this RN present. Pt. Has verbalized that she wants to be full code.

## 2021-07-24 NOTE — H&P (Signed)
Kristina Orozco is an 66 y.o. female.   Chief Complaint:  Chief Complaint  Patient presents with   Hypoglycemia, abdominal pain     HPI:  Hx metastatic gallbladder cancer, presents today w/ low Glc and increased abd pain. Reports abdomen feels swollen and tight. Reprots fatigue. Denies SOB/CP.   Recent scans show worsening mets including to bone, lungs. Also showed concern for liver infarct. Pt reports pain is controlled w/ ED intervention.   ER course: Multiple lab abnormalities and VS c/w SIRS without sepsis. Glc dropping multiple times, will respond to dextrose but drops again within an hour. GOC discussion: she would like to talk more with family. She is very clear that she wants CPR/resuscitative efforts even if this does not have a chance of saving her life. 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 Made clear that at this stage of metastatic disease and with evidence of liver failure, tachycardia, hypoglycemia, kidney injury, hyperkalemia, bleeding risk etc she is very high risk for cardiac/general decompensation. Made clear that in her condition, CPR / intubation / life support is not going to cure her underlying illness which is I believe a terminal / end stage condition at this point. Made clear that CPR / resuscitation is invasive, painful, extremely unlikely to result in successful resuscitation or result in good QOL. Made clear that the alternative is to treat pain and anxiety, and to allow nature to take its course and allow for peaceful passing. She would  like to talk to her family tomorrow. She made clear that she would want CPR / resuscitation attempted even if it will not result in saving her life. Palliative consult placed. Multiple caring family members are involved. Patient currently has capacity to give informed consent/refusal.  Hyperkalemia to 6.0 up from 3.2 four days ago  Likely d/t AKI and tissue catabolism  EKG tracing reviewed: sinus tach, normal T waves Cannot give insulin in setting of hypoglycemia but this would not correct underlying problem  High risk for decompensation   Hypoglycemia Glc checks q1 hour, dextrose prn D5 NS continuous Encourage po intake but pt reports low appetite, severe hepatomegaly likely reducing gastric fxn/volume   Liver failure d/t hepatic infarct and metastatic disease Trend AM labs but expect will worsen Pain control is priority given the infarct   AKI Cr 2.28 up from baseline around 1 Fluid resuscitation and trend labs Poor po intake and metastatic disease likely cause   Hyponatremia  Likely d/t liver failure / ascites Not critical, NS IV fluids, check in AM  Anemia - stable Trend AM Expect likley dilutional effect  No s/s bleeding at this time   Thrombocytopenia - Plt trending down  Trending down Likely d/t liver failure Holding Rx VTE Ppx   VTE Ppx: SCD given anemia, thrombocytopenia, liver failure CODE: FULL Dispo: poor prognosis, high risk decompensation         Past Medical History:  Diagnosis Date   Cancer ([place]) 05/17/2020   gallbladder   Diabetes mellitus without complication ([place])    History of  chemotherapy    started 06/2020   Hypertension     Past Surgical History:  Procedure Laterality Date   ABDOMINAL HYSTERECTOMY     APPENDECTOMY  2004   BREAST BIOPSY Left 2011   BREAST BIOPSY Right 08/26/13   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/28/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   CHOLECYSTECTOMY N/A 05/17/2020   Procedure: LAPAROSCOPIC CHOLECYSTECTOMY;  Surgeon: Fredirick Maudlin, MD;   Location: [facility] ORS;  Service: General;  Laterality: N/A;   COLONOSCOPY WITH PROPOFOL N/A 01/31/2018   Procedure: COLONOSCOPY WITH PROPOFOL;  Surgeon: Jonathon Bellows, MD;  Location: [facility] END[facility];  Service: Gastroenterology;  Laterality: N/A;   LAPAROSCOPIC LYSIS OF ADHESIONS  01/04/2021   Procedure: LAPAROSCOPIC LYSIS OF ADHESIONS;  Surgeon: Stark Klein, MD;  Location: [place];  Service: General;;  1 HOUR   LAPAROSCOPY N/A 01/04/2021   Procedure: LAPAROSCOPY DIAGNOSTIC;  Surgeon: Stark Klein, MD;  Location: [place];  Service: General;  Laterality: N/A;  EPIDURAL   PORTACATH PLACEMENT Right 07/02/2020   Procedure: INSERTION PORT-A-CATH, chemotherapy;  Surgeon: Fredirick Maudlin, MD;  Location: [facility] ORS;  Service: General;  Laterality: Right;    Family History  Problem Relation Age of Onset   Pancreatic cancer Mother        AT 24 years   Breast cancer Sister        appx-55 years   Pancreatic cancer Maternal Grandmother    Social History:  reports that she has never smoked. She has never used smokeless tobacco. She reports current alcohol use of about 2.0 standard drinks per week. She reports that she does not use drugs.  Allergies:  Allergies  Allergen Reactions   Seasonal Ic [Cholestatin]     Seasonal allergies causes drainage and stuffy nose    (Not in a hospital admission)   Results for orders placed or performed during the hospital encounter of 08/18/2021 (from the past 48 hour(s))  CBG monitoring, ED     Status: Abnormal   Collection Time: 07/31/2021  2:43 PM  Result Value Ref Range   Glucose-Capillary 69 (L) 70 - 99 mg/dL    Comment: Glucose reference range applies only to samples taken after fasting for at least 8 hours.  CBC with Differential     Status: Abnormal   Collection Time: 08/14/2021  2:53 PM  Result Value Ref Range   WBC 32.0 (H) 4.0 - 10.5 K/uL   RBC 3.72 (L) 3.87 - 5.11 MIL/uL   Hemoglobin 9.2 (L) 12.0 - 15.0 g/dL   HCT 30.4 (L) 36.0 - 46.0 %   MCV 81.7 80.0 - 100.0  fL   MCH 24.7 (L) 26.0 - 34.0 pg   MCHC 30.3 30.0 - 36.0 g/dL   RDW 22.1 (H) 11.5 - 15.5 %   Platelets 85 (L) 150 - 400 K/uL    Comment: Immature Platelet Fraction may be clinically indicated, consider ordering this additional test [id number]    nRBC 1.0 (H) 0.0 - 0.2 %   Neutrophils Relative % 84 %   Neutro Abs 26.9 (H) 1.7 - 7.7 K/uL   Lymphocytes Relative 8 %   Lymphs Abs 2.6 0.7 - 4.0 K/uL   Monocytes Relative 3 %   Monocytes Absolute 1.0 0.1 - 1.0 K/uL   Eosinophils Relative 0 %   Eosinophils Absolute 0.0 0.0 - 0.5 K/uL   Basophils Relative 1 %   Basophils Absolute 0.3 (H) 0.0 - 0.1 K/uL   WBC Morphology MILD LEFT SHIFT (1-5% METAS, OCC MYELO, OCC BANDS)  Comment: VACUOLATED NEUTROPHILS   RBC Morphology MORPHOLOGY UNREMARKABLE    Smear Review PLATELETS APPEAR DECREASED    nRBC 1 (H) 0 /100 WBC   Metamyelocytes Relative 2 %   Myelocytes 2 %   Abs Immature Granulocytes 1.30 (H) 0.00 - 0.07 K/uL    Comment: Performed at [facility], [address] [place], [place] [address]  Comprehensive metabolic panel     Status: Abnormal   Collection Time: 07/27/2021  2:53 PM  Result Value Ref Range   Sodium 132 (L) 135 - 145 mmol/L   Potassium 6.0 (H) 3.5 - 5.1 mmol/L   Chloride 97 (L) 98 - 111 mmol/L   CO2 16 (L) 22 - 32 mmol/L   Glucose, Bld 116 (H) 70 - 99 mg/dL    Comment: Glucose reference range applies only to samples taken after fasting for at least 8 hours.   BUN 47 (H) 8 - 23 mg/dL   Creatinine, Ser 2.28 (H) 0.44 - 1.00 mg/dL   Calcium 8.7 (L) 8.9 - 10.3 mg/dL   Total Protein 6.7 6.5 - 8.1 g/dL   Albumin 2.4 (L) 3.5 - 5.0 g/dL   AST 493 (H) 15 - 41 U/L   ALT 148 (H) 0 - 44 U/L   Alkaline Phosphatase 522 (H) 38 - 126 U/L   Total Bilirubin 3.5 (H) 0.3 - 1.2 mg/dL   GFR, Estimated 23 (L) >60 mL/min    Comment: (NOTE) Calculated using the CKD-EPI Creatinine Equation (2021)    Anion gap 19 (H) 5 - 15    Comment: Performed at [facility], [address] [place], [place] [address]  CBG monitoring, ED (now and then every hour for 3 hours)     Status: Abnormal   Collection Time: 08/18/2021  3:56 PM  Result Value Ref Range   Glucose-Capillary 179 (H) 70 - 99 mg/dL    Comment: Glucose reference range applies only to samples taken after fasting for at least 8 hours.  CBG monitoring, ED (now and then every hour for 3 hours)     Status: Abnormal   Collection Time: 08/02/2021  4:58 PM  Result Value Ref Range   Glucose-Capillary 102 (H) 70 - 99 mg/dL    Comment: Glucose reference range applies only to samples taken after fasting for at least 8 hours.  CBG monitoring, ED (now and then every hour for 3 hours)     Status: None   Collection Time: 07/25/2021  5:14 PM  Result Value Ref Range   Glucose-Capillary 92 70 - 99 mg/dL    Comment: Glucose reference range applies only to samples taken after fasting for at least 8 hours.  Protime-INR     Status: Abnormal   Collection Time: 08/20/2021  5:47 PM  Result Value Ref Range   Prothrombin Time 35.5 (H) 11.4 - 15.2 seconds   INR 3.5 (H) 0.8 - 1.2    Comment: (NOTE) INR goal varies based on device and disease states. Performed at [facility], [address] [place] , [place] [address]   APTT     Status: Abnormal   Collection Time: 07/29/2021  5:47 PM  Result Value Ref Range   aPTT 48 (H) 24 - 36 seconds    Comment:        IF BASELINE aPTT IS ELEVATED, SUGGEST PATIENT RISK ASSESSMENT BE USED TO DETERMINE APPROPRIATE ANTICOAGULANT THERAPY. Performed at [facility], [address], [place], [place] [address]   POC CBG, ED  Status: Abnormal   Collection Time: 07/25/2021  6:07 PM  Result Value Ref Range   Glucose-Capillary 59 (L) 70 - 99 mg/dL    Comment: Glucose reference range applies only to samples taken after fasting for at least 8 hours.  POC CBG, ED     Status: Abnormal   Collection Time: 08/06/2021  6:31 PM  Result Value Ref Range   Glucose-Capillary 142 (H) 70 -  99 mg/dL    Comment: Glucose reference range applies only to samples taken after fasting for at least 8 hours.   DG Chest Port 1 View  Result Date: 08/20/2021 CLINICAL DATA:  HYPOGLYCEMIA, weakness, see recent CT chest EXAM: PORTABLE CHEST 1 VIEW COMPARISON:  CT chest 07/22/2021, chest x-ray 07/02/2020 FINDINGS: Similar-appearing right chest wall Port-A-Cath with tip overlying the expected region of the left atrium. The heart and mediastinal contours are unchanged. Elevated right hemidiaphragm with streaky airspace opacities at the right base likely representing atelectasis. Otherwise no focal consolidation. No pulmonary edema. Persistent trace right pleural effusion. No left pleural effusion. No pneumothorax. No acute osseous abnormality. IMPRESSION: 1. Persistent trace right pleural effusion in the setting of an elevated right hemidiaphragm and right basilar atelectasis. 2. Similar-appearing right chest wall Port-A-Cath with tip overlying the expected region of the left atrium. Correlate with any dysrhythmia and consider repositioning/replacing as needed. Electronically Signed   By: Iven Finn M.D.   On: 08/06/2021 16:33   CT Renal Stone Study  Result Date: 08/21/2021 CLINICAL DATA:  66 year old female with history of renal ischemia or infarction. EXAM: CT ABDOMEN AND PELVIS WITHOUT CONTRAST TECHNIQUE: Multidetector CT imaging of the abdomen and pelvis was performed following the standard protocol without IV contrast. COMPARISON:  CT the chest, abdomen and pelvis 07/22/2021. FINDINGS: Lower chest: Trace right pleural effusion lying dependently with some passive subsegmental atelectasis in the right lower lobe. Aortic atherosclerosis. Central venous catheter terminating in the right ventricle. Hepatobiliary: Liver is diffusely enlarged and heterogeneous in appearance. Multiple hepatic metastatic lesions noted on the recent prior examination are not as well demonstrated on today's noncontrast CT  examination. Status post cholecystectomy. Pancreas: No definite pancreatic mass or peripancreatic fluid collections or inflammatory changes are noted on today's noncontrast CT examination. Spleen: Unremarkable. Adrenals/Urinary Tract: 2.0 x 1.2 cm low-attenuation lesion in the lower pole of the left kidney, compatible with a simple cyst. Right kidney and bilateral adrenal glands are normal in appearance. No hydroureteronephrosis. Urinary bladder is filled with iodinated contrast material, but otherwise unremarkable in appearance. Stomach/Bowel: The appearance of the stomach is normal. No pathologic dilatation of small bowel or colon. Numerous colonic diverticulae are noted, without surrounding inflammatory changes to suggest an acute diverticulitis at this time. The appendix is not confidently identified and may be surgically absent. Regardless, there are no inflammatory changes noted adjacent to the cecum to suggest the presence of an acute appendicitis at this time. Vascular/Lymphatic: Aortic atherosclerosis. Multiple enlarged and borderline enlarged retroperitoneal, upper abdominal and pelvic lymph nodes. The largest of these is in the left para-aortic nodal station (axial image 51 of series 2) measuring 1.7 cm in short axis. The largest upper abdominal lymph node measures 1.4 cm in short axis adjacent to the lesser curvature of the antrum of the stomach. Reproductive: Status post hysterectomy. Ovaries are not confidently identified may be surgically absent or atrophic. Other: Multiple tiny peritoneal nodules again noted. No significant volume of ascites. No pneumoperitoneum. Musculoskeletal: There are no aggressive appearing lytic or blastic lesions noted in the visualized  portions of the skeleton. IMPRESSION: 1. Widespread metastatic disease in the abdomen and pelvis, most notably throughout the liver, poorly evaluated on today's noncontrast CT examination. 2. Trace right pleural effusion lying dependently. 3.  Colonic diverticulosis without evidence of acute diverticulitis at this time. 4. Aortic atherosclerosis. 5. Additional incidental findings, as above. Electronically Signed   By: Vinnie Langton M.D.   On: 08/13/2021 16:59    Review of Systems  Constitutional:  Positive for appetite change and fatigue.  HENT:  Negative for trouble swallowing.   Respiratory:  Negative for chest tightness and shortness of breath.   Cardiovascular:  Positive for leg swelling. Negative for chest pain and palpitations.  Gastrointestinal:  Positive for abdominal distention and abdominal pain.  Genitourinary:  Negative for difficulty urinating.  Musculoskeletal:  Negative for arthralgias.  Neurological:  Negative for syncope and light-headedness.  Psychiatric/Behavioral:  The patient is nervous/anxious.    Blood pressure (!) 117/45, pulse (!) 122, temperature 98.2 F (36.8 C), temperature source Oral, resp. rate (!) 34, height 5\' 7"  (1.702 m), weight 86.2 kg, SpO2 98 %. Physical Exam Constitutional:      Appearance: Normal appearance. She is ill-appearing. She is not toxic-appearing.  HENT:     Head: Normocephalic and atraumatic.  Cardiovascular:     Rate and Rhythm: Tachycardia present.  Pulmonary:     Effort: Pulmonary effort is normal.     Breath sounds: Normal breath sounds.  Abdominal:     General: There is distension.     Palpations: There is mass.     Tenderness: There is abdominal tenderness.     Comments: HEPATOMEGALY   Musculoskeletal:     Cervical back: Neck supple. No rigidity.     Right lower leg: Edema (TRACE) present.     Left lower leg: Edema (TRACE) present.  Skin:    General: Skin is dry.  Neurological:     General: No focal deficit present.     Mental Status: She is alert and oriented to person, place, and time.  Psychiatric:        Mood and Affect: Mood normal.        Behavior: Behavior normal.        Thought Content: Thought content normal.     Emeterio Reeve,  DO 08/22/2021, 6:48 PM

## 2021-07-24 NOTE — ED Notes (Signed)
Rad at bedside.

## 2021-07-24 NOTE — Assessment & Plan Note (Signed)
Trend AM Expect likley dilutional effect  No s/s bleeding at this time

## 2021-07-24 NOTE — Assessment & Plan Note (Signed)
Fluid resuscitation and trend labs Poor po intake and metastatic disease likely cause

## 2021-07-24 NOTE — ED Provider Notes (Signed)
Admission was discussed and accepted to hospitalist service by Dr. Jonny Ruiz, MD 07/27/2021 [phone]

## 2021-07-24 NOTE — ED Provider Notes (Signed)
[facility] Provider Note    Event Date/Time   First MD Initiated Contact with Patient 07/25/2021 1546     (approximate)   History   Hypoglycemia   HPI  Kristina Orozco is a 66 y.o. female history of metastatic gallbladder cancer currently under therapy follows with oncology  Had a CT scan done just a few days ago for follow-up.  Has not really discussed results with her doctor.  On Saturday noticed that she started to have an increasing and steady pain located mostly in her mid to right upper abdomen.  Not associated nausea vomiting or diarrhea  No chest pain no shortness of breath no fevers.  EMS was called to her home due to the pain and noted to her blood sugar was low, she received orange juice and her blood sugar remained somewhat low.  Also received D10     Physical Exam   Triage Vital Signs: ED Triage Vitals  Enc Vitals Group     BP 08/10/2021 1445 119/88     Pulse Rate 08/01/2021 1445 (!) 128     Resp 08/04/2021 1445 20     Temp 08/12/2021 1445 98.2 F (36.8 C)     Temp Source 08/22/2021 1445 Oral     SpO2 07/29/2021 1445 96 %     Weight 08/21/2021 1446 190 lb (86.2 kg)     Height 07/28/2021 1446 5\' 7"  (1.702 m)     Head Circumference --      Peak Flow --      Pain Score 08/11/2021 1445 4     Pain Loc --      Pain Edu? --      Excl. in [place]? --     Most recent vital signs: Vitals:   08/04/2021 1800 08/11/2021 1815  BP: 106/67 (!) 117/45  Pulse: (!) 123 (!) 122  Resp: (!) 28 (!) 34  Temp:    SpO2: 95% 98%     General: Awake, no distress.  Pleasant with sister at bedside CV:  Good peripheral perfusion.  Peripheral perfusion excellent in the hands and feet bilateral Resp:  Normal effort.  Abd:  Some distention slight of the abdomen.  Fairly firm right upper abdomen reports pain along the right flank and right mid abdomen.  No obvious ascites.  No obvious herniations. No skin rashes No meningismus. Alert oriented follows commands without difficulty.   Normal mentation   ED Results / Procedures / Treatments   Labs (all labs ordered are listed, but only abnormal results are displayed) Labs Reviewed  CBC WITH DIFFERENTIAL/PLATELET - Abnormal; Notable for the following components:      Result Value   WBC 32.0 (*)    RBC 3.72 (*)    Hemoglobin 9.2 (*)    HCT 30.4 (*)    MCH 24.7 (*)    RDW 22.1 (*)    Platelets 85 (*)    nRBC 1.0 (*)    Neutro Abs 26.9 (*)    Basophils Absolute 0.3 (*)    nRBC 1 (*)    Abs Immature Granulocytes 1.30 (*)    All other components within normal limits  COMPREHENSIVE METABOLIC PANEL - Abnormal; Notable for the following components:   Sodium 132 (*)    Potassium 6.0 (*)    Chloride 97 (*)    CO2 16 (*)    Glucose, Bld 116 (*)    BUN 47 (*)    Creatinine, Ser 2.28 (*)  Calcium 8.7 (*)    Albumin 2.4 (*)    AST 493 (*)    ALT 148 (*)    Alkaline Phosphatase 522 (*)    Total Bilirubin 3.5 (*)    GFR, Estimated 23 (*)    Anion gap 19 (*)    All other components within normal limits  PROTIME-INR - Abnormal; Notable for the following components:   Prothrombin Time 35.5 (*)    INR 3.5 (*)    All other components within normal limits  APTT - Abnormal; Notable for the following components:   aPTT 48 (*)    All other components within normal limits  CBG MONITORING, ED - Abnormal; Notable for the following components:   Glucose-Capillary 69 (*)    All other components within normal limits  CBG MONITORING, ED - Abnormal; Notable for the following components:   Glucose-Capillary 179 (*)    All other components within normal limits  CBG MONITORING, ED - Abnormal; Notable for the following components:   Glucose-Capillary 102 (*)    All other components within normal limits  CBG MONITORING, ED - Abnormal; Notable for the following components:   Glucose-Capillary 59 (*)    All other components within normal limits  CULTURE, BLOOD (ROUTINE X 2)  CULTURE, BLOOD (ROUTINE X 2)  LACTIC ACID, PLASMA   LACTIC ACID, PLASMA  URINALYSIS, COMPLETE (UACMP) WITH MICROSCOPIC  CBG MONITORING, ED  CBG MONITORING, ED  CBG MONITORING, ED  CBG MONITORING, ED  CBG MONITORING, ED  CBG MONITORING, ED  CBG MONITORING, ED  CBG MONITORING, ED   Notable for mild hyperkalemia, reduced CO2, elevated creatinine from baseline transaminitis INR pending  Significant neutrophilia and leukosite ptosis Baseline hemoglobin and a thrombocytopenia  EKG  Reviewed inter by me at 1700 Heart rate 129 QRS 99 QTc 490 Sinus tachycardia, mild prolongation of QT   RADIOLOGY  DG Chest Port 1 View  Result Date: 08/18/2021 CLINICAL DATA:  HYPOGLYCEMIA, weakness, see recent CT chest EXAM: PORTABLE CHEST 1 VIEW COMPARISON:  CT chest 07/22/2021, chest x-ray 07/02/2020 FINDINGS: Similar-appearing right chest wall Port-A-Cath with tip overlying the expected region of the left atrium. The heart and mediastinal contours are unchanged. Elevated right hemidiaphragm with streaky airspace opacities at the right base likely representing atelectasis. Otherwise no focal consolidation. No pulmonary edema. Persistent trace right pleural effusion. No left pleural effusion. No pneumothorax. No acute osseous abnormality. IMPRESSION: 1. Persistent trace right pleural effusion in the setting of an elevated right hemidiaphragm and right basilar atelectasis. 2. Similar-appearing right chest wall Port-A-Cath with tip overlying the expected region of the left atrium. Correlate with any dysrhythmia and consider repositioning/replacing as needed. Electronically Signed   By: Iven Finn M.D.   On: 08/04/2021 16:33   CT Renal Stone Study  Result Date: 08/16/2021 CLINICAL DATA:  66 year old female with history of renal ischemia or infarction. EXAM: CT ABDOMEN AND PELVIS WITHOUT CONTRAST TECHNIQUE: Multidetector CT imaging of the abdomen and pelvis was performed following the standard protocol without IV contrast. COMPARISON:  CT the chest, abdomen and  pelvis 07/22/2021. FINDINGS: Lower chest: Trace right pleural effusion lying dependently with some passive subsegmental atelectasis in the right lower lobe. Aortic atherosclerosis. Central venous catheter terminating in the right ventricle. Hepatobiliary: Liver is diffusely enlarged and heterogeneous in appearance. Multiple hepatic metastatic lesions noted on the recent prior examination are not as well demonstrated on today's noncontrast CT examination. Status post cholecystectomy. Pancreas: No definite pancreatic mass or peripancreatic fluid collections or inflammatory changes  are noted on today's noncontrast CT examination. Spleen: Unremarkable. Adrenals/Urinary Tract: 2.0 x 1.2 cm low-attenuation lesion in the lower pole of the left kidney, compatible with a simple cyst. Right kidney and bilateral adrenal glands are normal in appearance. No hydroureteronephrosis. Urinary bladder is filled with iodinated contrast material, but otherwise unremarkable in appearance. Stomach/Bowel: The appearance of the stomach is normal. No pathologic dilatation of small bowel or colon. Numerous colonic diverticulae are noted, without surrounding inflammatory changes to suggest an acute diverticulitis at this time. The appendix is not confidently identified and may be surgically absent. Regardless, there are no inflammatory changes noted adjacent to the cecum to suggest the presence of an acute appendicitis at this time. Vascular/Lymphatic: Aortic atherosclerosis. Multiple enlarged and borderline enlarged retroperitoneal, upper abdominal and pelvic lymph nodes. The largest of these is in the left para-aortic nodal station (axial image 51 of series 2) measuring 1.7 cm in short axis. The largest upper abdominal lymph node measures 1.4 cm in short axis adjacent to the lesser curvature of the antrum of the stomach. Reproductive: Status post hysterectomy. Ovaries are not confidently identified may be surgically absent or atrophic.  Other: Multiple tiny peritoneal nodules again noted. No significant volume of ascites. No pneumoperitoneum. Musculoskeletal: There are no aggressive appearing lytic or blastic lesions noted in the visualized portions of the skeleton. IMPRESSION: 1. Widespread metastatic disease in the abdomen and pelvis, most notably throughout the liver, poorly evaluated on today's noncontrast CT examination. 2. Trace right pleural effusion lying dependently. 3. Colonic diverticulosis without evidence of acute diverticulitis at this time. 4. Aortic atherosclerosis. 5. Additional incidental findings, as above. Electronically Signed   By: Vinnie Langton M.D.   On: 08/14/2021 16:59     Personally viewed imaging.  Reviewed report from radiologist discussed CT imaging with Dr. Sheppard Coil  Also reviewed the patient's recent CT abdomen pelvis with contrast from a couple days ago.  Notable for widespread metastatic static disease to the abdomen and pelvis.  Diverticulosis without diverticulitis.  Right pleural effusion   PROCEDURES:  Critical Care performed: Yes, see critical care procedure note(s)  Procedures   MEDICATIONS ORDERED IN ED: Medications  sodium chloride 0.9 % bolus 500 mL (0 mLs Intravenous Stopped 08/12/2021 1748)  morphine 2 MG/ML injection 2 mg (2 mg Intravenous Given 08/17/2021 1622)  ondansetron (ZOFRAN) injection 4 mg (4 mg Intravenous Given 08/19/2021 1756)  dextrose 50 % solution 50 mL (50 mLs Intravenous Given 08/12/2021 1810)     IMPRESSION / MDM / ASSESSMENT AND PLAN / ED COURSE  I reviewed the triage vital signs and the nursing notes.                              Differential diagnosis includes, but is not limited to, acute liver ischemia, hepatic failure, SIRS, acute infectious abnormality, tumor lysis syndrome etc.  AKI likely prerenal in nature, also considerations that would be renal and postrenal obstructive pathology.  Cultures performed to evaluate for infection.  Afebrile.  Does have  leukocytosis tachycardia, indicative of systemic inflammatory response type syndrome but no obvious infectious etiology did noted to this point at 6 PM at the ED.  Discussed with the hospitalist and they will evaluate and consider potential coverage with antibiotic but at this point I do not see an obvious source that would require treatment     Patient mid to hospitalist service Dr. Sheppard Coil.  Pain control with morphine antiemetics Zofran.  At  this point I have do not have certainty but my concern is the patient may be experiencing liver ischemia or liver infarct.  Her LFTs are uptrending, her white count elevated, tachycardia.  I have discussed her case with Dr. Janese Banks who advises symptomatic treatment but no targeted specific treatments at this time given the patient's widely spread metastatic cancer.  We will admit to the hospital for further care treatment and I do believe certainly needs discussion around goals of care  Clinical Course as of 07/29/2021 1828  Sun Jul 24, 2021  1614 Blood glucose rechecked improved.  Normal glycemic and slightly hyperglycemic.  I have sent message requesting call back to Dr. Janese Banks [MQ]    Clinical Course User Index [MQ] Delman Kitten, MD     FINAL CLINICAL IMPRESSION(S) / ED DIAGNOSES   Final diagnoses:  SIRS (systemic inflammatory response syndrome) ([place])  Transaminitis  AKI (acute kidney injury) ([place])  Hyperkalemia     Rx / DC Orders   ED Discharge Orders     None        Note:  This document was prepared using Dragon voice recognition software and may include unintentional dictation errors.   Delman Kitten, MD 08/12/2021 [phone]

## 2021-07-24 NOTE — Assessment & Plan Note (Signed)
Glc checks q1 hour, dextrose prn D5 NS continuous Encourage po intake but pt reports low appetite, severe hepatomegaly likely reducing gastric fxn/volume

## 2021-07-25 DIAGNOSIS — C23 Malignant neoplasm of gallbladder: Secondary | ICD-10-CM

## 2021-07-25 DIAGNOSIS — E875 Hyperkalemia: Secondary | ICD-10-CM

## 2021-07-25 DIAGNOSIS — G9340 Encephalopathy, unspecified: Secondary | ICD-10-CM | POA: Diagnosis not present

## 2021-07-25 DIAGNOSIS — C782 Secondary malignant neoplasm of pleura: Secondary | ICD-10-CM

## 2021-07-25 DIAGNOSIS — D511 Vitamin B12 deficiency anemia due to selective vitamin B12 malabsorption with proteinuria: Secondary | ICD-10-CM

## 2021-07-25 DIAGNOSIS — C7989 Secondary malignant neoplasm of other specified sites: Secondary | ICD-10-CM

## 2021-07-25 DIAGNOSIS — E162 Hypoglycemia, unspecified: Secondary | ICD-10-CM

## 2021-07-25 DIAGNOSIS — R109 Unspecified abdominal pain: Secondary | ICD-10-CM | POA: Diagnosis not present

## 2021-07-25 DIAGNOSIS — C7800 Secondary malignant neoplasm of unspecified lung: Secondary | ICD-10-CM

## 2021-07-25 DIAGNOSIS — C787 Secondary malignant neoplasm of liver and intrahepatic bile duct: Secondary | ICD-10-CM | POA: Diagnosis not present

## 2021-07-25 DIAGNOSIS — R4701 Aphasia: Secondary | ICD-10-CM

## 2021-07-25 DIAGNOSIS — R6510 Systemic inflammatory response syndrome (SIRS) of non-infectious origin without acute organ dysfunction: Secondary | ICD-10-CM

## 2021-07-25 LAB — COMPREHENSIVE METABOLIC PANEL
ALT: 167 U/L — ABNORMAL HIGH (ref 0–44)
AST: 626 U/L — ABNORMAL HIGH (ref 15–41)
Albumin: 2.3 g/dL — ABNORMAL LOW (ref 3.5–5.0)
Alkaline Phosphatase: 501 U/L — ABNORMAL HIGH (ref 38–126)
Anion gap: 18 — ABNORMAL HIGH (ref 5–15)
BUN: 50 mg/dL — ABNORMAL HIGH (ref 8–23)
CO2: 15 mmol/L — ABNORMAL LOW (ref 22–32)
Calcium: 8.4 mg/dL — ABNORMAL LOW (ref 8.9–10.3)
Chloride: 100 mmol/L (ref 98–111)
Creatinine, Ser: 2.38 mg/dL — ABNORMAL HIGH (ref 0.44–1.00)
GFR, Estimated: 22 mL/min — ABNORMAL LOW (ref >60)
Glucose, Bld: 100 mg/dL — ABNORMAL HIGH (ref 70–99)
Potassium: 5.7 mmol/L — ABNORMAL HIGH (ref 3.5–5.1)
Sodium: 133 mmol/L — ABNORMAL LOW (ref 135–145)
Total Bilirubin: 3.4 mg/dL — ABNORMAL HIGH (ref 0.3–1.2)
Total Protein: 6.1 g/dL — ABNORMAL LOW (ref 6.5–8.1)

## 2021-07-25 LAB — CBG MONITORING, ED
Glucose-Capillary: 101 mg/dL — ABNORMAL HIGH (ref 70–99)
Glucose-Capillary: 103 mg/dL — ABNORMAL HIGH (ref 70–99)
Glucose-Capillary: 104 mg/dL — ABNORMAL HIGH (ref 70–99)
Glucose-Capillary: 105 mg/dL — ABNORMAL HIGH (ref 70–99)
Glucose-Capillary: 98 mg/dL (ref 70–99)

## 2021-07-25 LAB — PROTIME-INR
INR: 4.8 (ref 0.8–1.2)
Prothrombin Time: 44.9 seconds — ABNORMAL HIGH (ref 11.4–15.2)

## 2021-07-25 LAB — CBC
HCT: 27.6 % — ABNORMAL LOW (ref 36.0–46.0)
Hemoglobin: 8.4 g/dL — ABNORMAL LOW (ref 12.0–15.0)
MCH: 24.7 pg — ABNORMAL LOW (ref 26.0–34.0)
MCHC: 30.4 g/dL (ref 30.0–36.0)
MCV: 81.2 fL (ref 80.0–100.0)
Platelets: 62 10*3/uL — ABNORMAL LOW (ref 150–400)
RBC: 3.4 MIL/uL — ABNORMAL LOW (ref 3.87–5.11)
RDW: 22.4 % — ABNORMAL HIGH (ref 11.5–15.5)
WBC: 32.5 10*3/uL — ABNORMAL HIGH (ref 4.0–10.5)
nRBC: 1.5 % — ABNORMAL HIGH (ref 0.0–0.2)

## 2021-07-25 LAB — RESP PANEL BY RT-PCR (FLU A&B, COVID) ARPGX2
Influenza A by PCR: NEGATIVE
Influenza B by PCR: NEGATIVE
SARS Coronavirus 2 by RT PCR: NEGATIVE

## 2021-07-25 LAB — LACTIC ACID, PLASMA: Lactic Acid, Venous: >9 mmol/L (ref 0.5–1.9)

## 2021-07-25 LAB — APTT: aPTT: 65 seconds — ABNORMAL HIGH (ref 24–36)

## 2021-07-25 MED ORDER — VITAMIN K1 10 MG/ML IJ SOLN
10.0000 mg | Freq: Once | INTRAVENOUS | Status: DC
  Administered 2021-07-25: 10 mg via INTRAVENOUS
  Filled 2021-07-25: qty 1

## 2021-07-25 MED ORDER — GLYCOPYRROLATE 0.2 MG/ML IJ SOLN
0.2000 mg | INTRAMUSCULAR | Status: DC | PRN
  Filled 2021-07-25: qty 1

## 2021-07-25 MED ORDER — METRONIDAZOLE 500 MG/100ML IV SOLN
500.0000 mg | Freq: Two times a day (BID) | INTRAVENOUS | Status: DC
  Administered 2021-07-25: 500 mg via INTRAVENOUS
  Filled 2021-07-25: qty 100

## 2021-07-25 MED ORDER — SODIUM CHLORIDE 0.9 % IV SOLN
2.0000 g | INTRAVENOUS | Status: DC
  Administered 2021-07-25: 2 g via INTRAVENOUS
  Filled 2021-07-25: qty 2

## 2021-07-25 MED ORDER — LORAZEPAM 1 MG PO TABS: 1.0000 mg | ORAL_TABLET | ORAL | Status: DC | PRN

## 2021-07-25 MED ORDER — LACTATED RINGERS IV BOLUS (SEPSIS): 1000.0000 mL | Freq: Once | INTRAVENOUS | Status: DC

## 2021-07-25 MED ORDER — LORAZEPAM 2 MG/ML PO CONC
1.0000 mg | ORAL | Status: DC | PRN
  Filled 2021-07-25: qty 0.5

## 2021-07-25 MED ORDER — MORPHINE SULFATE (PF) 2 MG/ML IV SOLN
1.0000 mg | INTRAVENOUS | Status: DC | PRN
  Administered 2021-07-25 – 2021-07-26 (×4): 1 mg via INTRAVENOUS
  Filled 2021-07-25 (×4): qty 1

## 2021-07-25 MED ORDER — SODIUM ZIRCONIUM CYCLOSILICATE 5 G PO PACK
5.0000 g | PACK | Freq: Once | ORAL | Status: AC
  Administered 2021-07-25: 5 g via ORAL
  Filled 2021-07-25: qty 1

## 2021-07-25 MED ORDER — SODIUM CHLORIDE 0.9 % IV SOLN: 2.0000 g | Freq: Once | INTRAVENOUS | Status: DC

## 2021-07-25 MED ORDER — ONDANSETRON 4 MG PO TBDP: 4.0000 mg | ORAL_TABLET | Freq: Four times a day (QID) | ORAL | Status: DC | PRN

## 2021-07-25 MED ORDER — ALBUTEROL SULFATE (2.5 MG/3ML) 0.083% IN NEBU: 2.5000 mg | INHALATION_SOLUTION | RESPIRATORY_TRACT | Status: DC | PRN

## 2021-07-25 MED ORDER — ONDANSETRON HCL 4 MG/2ML IJ SOLN: 4.0000 mg | Freq: Four times a day (QID) | INTRAMUSCULAR | Status: DC | PRN

## 2021-07-25 MED ORDER — LACTATED RINGERS IV BOLUS
1000.0000 mL | Freq: Once | INTRAVENOUS | Status: AC
  Administered 2021-07-25: 1000 mL via INTRAVENOUS

## 2021-07-25 MED ORDER — LACTATED RINGERS IV BOLUS (SEPSIS)
1000.0000 mL | Freq: Once | INTRAVENOUS | Status: AC
  Administered 2021-07-25: 1000 mL via INTRAVENOUS

## 2021-07-25 MED ORDER — LORAZEPAM 2 MG/ML IJ SOLN
0.2500 mg | Freq: Once | INTRAMUSCULAR | Status: AC
  Administered 2021-07-25: 0.25 mg via INTRAVENOUS
  Filled 2021-07-25: qty 1

## 2021-07-25 MED ORDER — LORAZEPAM 2 MG/ML IJ SOLN
1.0000 mg | INTRAMUSCULAR | Status: DC | PRN
  Administered 2021-07-25 – 2021-07-26 (×3): 1 mg via INTRAVENOUS
  Filled 2021-07-25 (×3): qty 1

## 2021-07-25 MED ORDER — GLYCOPYRROLATE 1 MG PO TABS
1.0000 mg | ORAL_TABLET | ORAL | Status: DC | PRN
  Filled 2021-07-25: qty 1

## 2021-07-25 MED ORDER — DIPHENHYDRAMINE HCL 25 MG PO CAPS
50.0000 mg | ORAL_CAPSULE | Freq: Once | ORAL | Status: AC
  Administered 2021-07-25: 50 mg via ORAL
  Filled 2021-07-25: qty 2

## 2021-07-25 NOTE — ED Notes (Signed)
Chaplain and family at bedside. Ativan and Morphine administered for comfort.

## 2021-07-25 NOTE — ED Notes (Signed)
Pt's family member left at this time.  Pt in hospital bed at this time.  Bed alarm turned on at this time d/t pt's increased confusion and attempting to get out of bed. MD Hal Hope notified to check if any meds can be given for increasing confusion and agitation.

## 2021-07-25 NOTE — Progress Notes (Signed)
PROGRESS NOTE    Kristina Orozco  QJJ:[id number] DOB: 12-07-1955 DOA: 08/17/2021 PCP: Theotis Burrow, MD    Brief Narrative:  Hx metastatic gallbladder cancer, presents today w/ low Glc and increased abd pain with recent imaging concerning for liver infarct. Pt presented clinically septic with markedly high leukocytosis, encephalopathy,   Assessment & Plan:   Principal Problem:   Metastasis from gallbladder cancer [facility]) Active Problems:   Goals of care, counseling/discussion   Hyperkalemia   Hypoglycemia   Hepatic infarction   AKI (acute kidney injury) ([place])   Hyponatremia   Anemia   Thrombocytopenia (HCC)   Sepsis with liver infarct present on admit Presenting white blood count 48,185 with metabolic acidosis, lactate pending Patient appears encephalopathic Blood cultures ordered, pending Chest x-ray with persistent right trace pleural effusion in the setting of elevated right hemidiaphragm Will empirically start patient on cefepime and Flagyl Follow-up on lactate trends Continue aggressive IV fluid hydration as needed Very poor overall prognosis Metastatic gallbladder cancer Chart reviewed.  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 Despite cancer treatments, disease had continued to spread and is now widely metastatic. Family reports being recommended no further cancer treatments and consideration for hospice instead.  However, what is documented is consideration for initiating Stiv[place]a, per Dr. Rogue Bussing on 12/30. Will discuss with oncology. Hyperkalemia Given Kayexalate Continue to follow electrolyte trends Hypoglycemia Presenting glucose of 69, improved with dextrose Continue dextrose containing fluids as tolerated Liver failure Recent CT abdomen reviewed, findings notable for markedly metastatic disease concerns for hepatic infarct LFTs are trending up Started empiric broad-spectrum antibiotics per above Follow  LFT trends Poor overall prognosis given progressive disease Palliative care consulted [facility] Continued on IV fluid hydration as tolerated Repeat basic metabolic panel in the morning Hyponatremia Possible secondary to dehydration Patient is continued on aggressive IV fluid hydration, sodium improved to 133 Normocytic anemia Suspect secondary to chronic illness in light of metastatic disease No evidence of acute blood loss at this time Thrombocytopenia Likely secondary to metastatic disease as well as liver failure Would avoid blood thinners Repeat CBC in the morning End of life See presenting H&P.  End-of-life was discussed with patient family at time of presentation, patient had voiced her wishes for full CODE STATUS at time of presentation despite understanding her grave situation and poor outlook Palliative care consulted, would greatly appreciate recommendations and assistance  DVT prophylaxis: SCD's Code Status: Full Family Communication: Pt in room, sister at bedside  Status is: Inpatient  Remains inpatient appropriate because: Severity of illness   Consultants:  Oncology Palliative Care  Procedures:    Antimicrobials: Anti-infectives (From admission, onward)    Start     Dose/Rate Route Frequency Ordered Stop   07/25/21 1700  metroNIDAZOLE (FLAGYL) IVPB 500 mg        500 mg 100 mL/hr over 60 Minutes Intravenous Every 12 hours 07/25/21 1641 08/01/21 1659   07/25/21 1645  ceFEPIme (MAXIPIME) 2 g in sodium chloride 0.9 % 100 mL IVPB  Status:  Discontinued        2 g 200 mL/hr over 30 Minutes Intravenous  Once 07/25/21 1641 07/25/21 1643   07/25/21 1645  ceFEPIme (MAXIPIME) 2 g in sodium chloride 0.9 % 100 mL IVPB        2 g 200 mL/hr over 30 Minutes Intravenous Every 24 hours 07/25/21 1643 08/01/21 1644       Subjective: Unable to obtain from patient given lethargy  Objective: Vitals:  07/25/21 0930 07/25/21 1130 07/25/21 1430 07/25/21 1530  BP: 112/65  140/68 100/74 104/79  Pulse: (!) 123 (!) 126 (!) 125 (!) 126  Resp: 18 17 (!) 21   Temp:      TempSrc:      SpO2: 98% 96% 94% 95%  Weight:      Height:        Intake/Output Summary (Last 24 hours) at 07/25/2021 1649 Last data filed at 08/10/2021 1748 Gross per 24 hour  Intake 500 ml  Output --  Net 500 ml   Filed Weights   08/12/2021 1446  Weight: 86.2 kg    Examination: General exam: Arousable, laying in bed, in nad Respiratory system: Normal respiratory effort, no wheezing Cardiovascular system: regular rate, s1, s2 Gastrointestinal system: Soft, nondistended, positive BS Central nervous system: CN2-12 grossly intact, strength intact Extremities: Perfused, no clubbing Skin: Normal skin turgor, no notable skin lesions seen Psychiatry: Unable to assess given lethargy  Data Reviewed: I have personally reviewed following labs and imaging studies  CBC: Recent Labs  Lab 07/20/21 0811 08/18/2021 1453 07/25/21 0350  WBC 6.3 32.0* 32.5*  NEUTROABS 2.4 26.9*  --   HGB 9.5* 9.2* 8.4*  HCT 30.6* 30.4* 27.6*  MCV 81.6 81.7 81.2  PLT 136* 85* 62*   Basic Metabolic Panel: Recent Labs  Lab 07/20/21 0811 07/28/2021 1453 07/25/21 0350  NA 135 132* 133*  K 3.2* 6.0* 5.7*  CL 105 97* 100  CO2 17* 16* 15*  GLUCOSE 100* 116* 100*  BUN 15 47* 50*  CREATININE 0.76 2.28* 2.38*  CALCIUM 6.8* 8.7* 8.4*  MG 1.8  --   --    GFR: Estimated Creatinine Clearance: 26.2 mL/min (A) (by C-G formula based on SCr of 2.38 mg/dL (H)). Liver Function Tests: Recent Labs  Lab 07/20/21 0811 08/08/2021 1453 07/25/21 0350  AST 74* 493* 626*  ALT 33 148* 167*  ALKPHOS 264* 522* 501*  BILITOT 0.6 3.5* 3.4*  PROT 5.3* 6.7 6.1*  ALBUMIN 2.2* 2.4* 2.3*   No results for input(s): LIPASE, AMYLASE in the last 168 hours. No results for input(s): AMMONIA in the last 168 hours. Coagulation Profile: Recent Labs  Lab 08/14/2021 1747  INR 3.5*   Cardiac Enzymes: No results for input(s): CKTOTAL, CKMB,  CKMBINDEX, TROPONINI in the last 168 hours. BNP (last 3 results) No results for input(s): PROBNP in the last 8760 hours. HbA1C: No results for input(s): HGBA1C in the last 72 hours. CBG: Recent Labs  Lab 07/25/21 0051 07/25/21 0252 07/25/21 0633 07/25/21 0903 07/25/21 1624  GLUCAP 105* 101* 103* 98 104*   Lipid Profile: No results for input(s): CHOL, HDL, LDLCALC, TRIG, CHOLHDL, LDLDIRECT in the last 72 hours. Thyroid Function Tests: No results for input(s): TSH, T4TOTAL, FREET4, T3FREE, THYROIDAB in the last 72 hours. Anemia Panel: No results for input(s): VITAMINB12, FOLATE, FERRITIN, TIBC, IRON, RETICCTPCT in the last 72 hours. Sepsis Labs: No results for input(s): PROCALCITON, LATICACIDVEN in the last 168 hours.  Recent Results (from the past 240 hour(s))  Resp Panel by RT-PCR (Flu A&B, Covid) Nasopharyngeal Swab     Status: None   Collection Time: 07/25/21  3:50 AM   Specimen: Nasopharyngeal Swab; Nasopharyngeal(NP) swabs in vial transport medium  Result Value Ref Range Status   SARS Coronavirus 2 by RT PCR NEGATIVE NEGATIVE Final    Comment: (NOTE) SARS-CoV-2 target nucleic acids are NOT DETECTED.  The SARS-CoV-2 RNA is generally detectable in upper respiratory specimens during the acute phase of infection.  The lowest concentration of SARS-CoV-2 viral copies this assay can detect is 138 copies/mL. A negative result does not preclude SARS-Cov-2 infection and should not be used as the sole basis for treatment or other patient management decisions. A negative result may occur with  improper specimen collection/handling, submission of specimen other than nasopharyngeal swab, presence of viral mutation(s) within the areas targeted by this assay, and inadequate number of viral copies(<138 copies/mL). A negative result must be combined with clinical observations, patient history, and epidemiological information. The expected result is Negative.  Fact Sheet for Patients:   EntrepreneurPulse.com.au  Fact Sheet for Healthcare Providers:  IncredibleEmployment.be  This test is no t yet approved or cleared by the [place] FDA and  has been authorized for detection and/or diagnosis of SARS-CoV-2 by FDA under an Emergency Use Authorization (EUA). This EUA will remain  in effect (meaning this test can be used) for the duration of the COVID-19 declaration under Section 564(b)(1) of the Act, 21 U.S.C.section 360bbb-3(b)(1), unless the authorization is terminated  or revoked sooner.       Influenza A by PCR NEGATIVE NEGATIVE Final   Influenza B by PCR NEGATIVE NEGATIVE Final    Comment: (NOTE) The Xpert Xpress SARS-CoV-2/FLU/RSV plus assay is intended as an aid in the diagnosis of influenza from Nasopharyngeal swab specimens and should not be used as a sole basis for treatment. Nasal washings and aspirates are unacceptable for Xpert Xpress SARS-CoV-2/FLU/RSV testing.  Fact Sheet for Patients: EntrepreneurPulse.com.au  Fact Sheet for Healthcare Providers: IncredibleEmployment.be  This test is not yet approved or cleared by the [place] FDA and has been authorized for detection and/or diagnosis of SARS-CoV-2 by FDA under an Emergency Use Authorization (EUA). This EUA will remain in effect (meaning this test can be used) for the duration of the COVID-19 declaration under Section 564(b)(1) of the Act, 21 U.S.C. section 360bbb-3(b)(1), unless the authorization is terminated or revoked.  Performed at [facility], [address], [place], [place] [address]      Radiology Studies: DG Chest [place] 1 View  Result Date: 08/09/2021 CLINICAL DATA:  HYPOGLYCEMIA, weakness, see recent CT chest EXAM: PORTABLE CHEST 1 VIEW COMPARISON:  CT chest 07/22/2021, chest x-ray 07/02/2020 FINDINGS: Similar-appearing right chest wall Port-A-Cath with tip overlying the expected region of  the left atrium. The heart and mediastinal contours are unchanged. Elevated right hemidiaphragm with streaky airspace opacities at the right base likely representing atelectasis. Otherwise no focal consolidation. No pulmonary edema. Persistent trace right pleural effusion. No left pleural effusion. No pneumothorax. No acute osseous abnormality. IMPRESSION: 1. Persistent trace right pleural effusion in the setting of an elevated right hemidiaphragm and right basilar atelectasis. 2. Similar-appearing right chest wall Port-A-Cath with tip overlying the expected region of the left atrium. Correlate with any dysrhythmia and consider repositioning/replacing as needed. Electronically Signed   By: Iven Finn M.D.   On: 08/03/2021 16:33   CT Renal Stone Study  Result Date: 07/30/2021 CLINICAL DATA:  66 year old female with history of renal ischemia or infarction. EXAM: CT ABDOMEN AND PELVIS WITHOUT CONTRAST TECHNIQUE: Multidetector CT imaging of the abdomen and pelvis was performed following the standard protocol without IV contrast. COMPARISON:  CT the chest, abdomen and pelvis 07/22/2021. FINDINGS: Lower chest: Trace right pleural effusion lying dependently with some passive subsegmental atelectasis in the right lower lobe. Aortic atherosclerosis. Central venous catheter terminating in the right ventricle. Hepatobiliary: Liver is diffusely enlarged and heterogeneous in appearance. Multiple hepatic metastatic lesions noted on the  recent prior examination are not as well demonstrated on today's noncontrast CT examination. Status post cholecystectomy. Pancreas: No definite pancreatic mass or peripancreatic fluid collections or inflammatory changes are noted on today's noncontrast CT examination. Spleen: Unremarkable. Adrenals/Urinary Tract: 2.0 x 1.2 cm low-attenuation lesion in the lower pole of the left kidney, compatible with a simple cyst. Right kidney and bilateral adrenal glands are normal in appearance. No  hydroureteronephrosis. Urinary bladder is filled with iodinated contrast material, but otherwise unremarkable in appearance. Stomach/Bowel: The appearance of the stomach is normal. No pathologic dilatation of small bowel or colon. Numerous colonic diverticulae are noted, without surrounding inflammatory changes to suggest an acute diverticulitis at this time. The appendix is not confidently identified and may be surgically absent. Regardless, there are no inflammatory changes noted adjacent to the cecum to suggest the presence of an acute appendicitis at this time. Vascular/Lymphatic: Aortic atherosclerosis. Multiple enlarged and borderline enlarged retroperitoneal, upper abdominal and pelvic lymph nodes. The largest of these is in the left para-aortic nodal station (axial image 51 of series 2) measuring 1.7 cm in short axis. The largest upper abdominal lymph node measures 1.4 cm in short axis adjacent to the lesser curvature of the antrum of the stomach. Reproductive: Status post hysterectomy. Ovaries are not confidently identified may be surgically absent or atrophic. Other: Multiple tiny peritoneal nodules again noted. No significant volume of ascites. No pneumoperitoneum. Musculoskeletal: There are no aggressive appearing lytic or blastic lesions noted in the visualized portions of the skeleton. IMPRESSION: 1. Widespread metastatic disease in the abdomen and pelvis, most notably throughout the liver, poorly evaluated on today's noncontrast CT examination. 2. Trace right pleural effusion lying dependently. 3. Colonic diverticulosis without evidence of acute diverticulitis at this time. 4. Aortic atherosclerosis. 5. Additional incidental findings, as above. Electronically Signed   By: Vinnie Langton M.D.   On: 07/25/2021 16:59    Scheduled Meds:  multivitamin with minerals  1 tablet Oral Daily   Continuous Infusions:  ceFEPime (MAXIPIME) IV     dextrose 5 % and 0.9% NaCl 125 mL/hr at 07/25/21 0515    metronidazole       LOS: 1 day   Marylu Lund, MD [facility] Pager On Amion  If 7PM-7AM, please contact night-coverage 07/25/2021, 4:49 PM

## 2021-07-25 NOTE — Consult Note (Signed)
Hematology/Oncology Consult note [facility] Telephone:([phone] Fax:([phone]  Patient Care Team: Alene Mires Elyse Jarvis, MD as PCP - General (Family Medicine) Rico Junker, RN as Registered Nurse Christene Lye, MD (General Surgery) Clent Jacks, RN as Oncology Nurse Navigator   Name of the patient: Kristina Orozco  [id number]  04/29/1956    Reason for consult: Metastatic gallbladder cancer   Requesting physician: Dr. Delman Kitten  Date of visit: 07/25/2021  History of presenting illness- Patient is a 66 year old female with a history of metastatic gallbladder cancer and sees Dr. Rogue Bussing as an outpatient.  Most recently she has been on 5-FU nab irinotecan chemotherapy.  She was evaluated by him on 07/20/2021 and given symptoms of worsening abdominal pain plan at that time was to get a CT chest abdomen and pelvis with contrast with consideration for further treatment in the future.  CT scan on 07/22/2021 showed marked interval worsening of disease involving the lung, pleural and extrapleural disease as well as worsening adenopathy and liver metastases.  Worsening abdominal nodal disease bony metastatic disease as well as peritoneal disease.  Before further discussions could be done as an outpatient patient presented to the ER with worsening abdominal pain.  She was found to be in acute liver failure with a bilirubin of 3.4 which was normal 5 days ago.  ALT AST and alkaline phosphatase are also elevated.  Serum creatinine is elevated at 2.38 as compared to 2.76 a week ago.  Patient has been having episodes of hypoglycemia.  Also noted to have acute encephalopathy.  At the time of my visit today and there was no family present at the bedside.  Patient was drowsy and could not be aroused.  ECOG PS- 4    Review of systems- Review of Systems  Unable to perform ROS: Mental status change   Allergies  Allergen Reactions   Seasonal Ic  [Cholestatin]     Seasonal allergies causes drainage and stuffy nose    Patient Active Problem List   Diagnosis Date Noted   Metastasis from gallbladder cancer ([place]) 07/30/2021   Hyperkalemia 07/25/2021   Hypoglycemia 08/19/2021   Hepatic infarction 08/21/2021   AKI (acute kidney injury) ([place]) 08/16/2021   Hyponatremia 08/23/2021   Anemia 07/27/2021   Thrombocytopenia ([place]) 08/18/2021   SIRS (systemic inflammatory response syndrome) ([place])    Transaminitis    Anemia due to antineoplastic chemotherapy 07/06/2021   Chemotherapy induced diarrhea 07/06/2021   Encounter for antineoplastic chemotherapy 07/06/2021   Goals of care, counseling/discussion 07/13/2020   Primary adenocarcinoma of gall bladder ([place]) 05/26/2020   S/P laparoscopic cholecystectomy 05/17/2020   Acute calculous cholecystitis 05/16/2020     Past Medical History:  Diagnosis Date   Cancer ([place]) 05/17/2020   gallbladder   Diabetes mellitus without complication ([place])    History of chemotherapy    started 06/2020   Hypertension      Past Surgical History:  Procedure Laterality Date   ABDOMINAL HYSTERECTOMY     APPENDECTOMY  2004   BREAST BIOPSY Left 2011   BREAST BIOPSY Right 08/26/13   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/28/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   CHOLECYSTECTOMY N/A 05/17/2020   Procedure: LAPAROSCOPIC CHOLECYSTECTOMY;  Surgeon: Fredirick Maudlin, MD;  Location: [facility] ORS;  Service: General;  Laterality: N/A;   COLONOSCOPY WITH PROPOFOL N/A 01/31/2018   Procedure: COLONOSCOPY WITH PROPOFOL;  Surgeon: Jonathon Bellows, MD;  Location: [facility] END[facility]COPY;  Service: Gastroenterology;  Laterality: N/A;   LAPAROSCOPIC LYSIS OF ADHESIONS  01/04/2021   Procedure: LAPAROSCOPIC LYSIS OF ADHESIONS;  Surgeon: Stark Klein, MD;  Location: [place];  Service: General;;  1 HOUR   LAPAROSCOPY N/A 01/04/2021   Procedure: LAPAROSCOPY DIAGNOSTIC;  Surgeon: Stark Klein, MD;  Location: [place];  Service: General;  Laterality: N/A;  EPIDURAL   PORTACATH  PLACEMENT Right 07/02/2020   Procedure: INSERTION PORT-A-CATH, chemotherapy;  Surgeon: Fredirick Maudlin, MD;  Location: [facility] ORS;  Service: General;  Laterality: Right;    Social History   Socioeconomic History   Marital status: Single    Spouse name: Not on file   Number of children: Not on file   Years of education: Not on file   Highest education level: Not on file  Occupational History   Occupation: special needs worker  Tobacco Use   Smoking status: Never   Smokeless tobacco: Never  Vaping Use   Vaping Use: Never used  Substance and Sexual Activity   Alcohol use: Yes    Alcohol/week: 2.0 standard drinks    Types: 2 Cans of beer per week    Comment: beer on weekend   Drug use: No   Sexual activity: Not Currently  Other Topics Concern   Not on file  Social History Narrative   Lives in [place]; with son. 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 Never smoked; beer/iqour over weekends.    Social Determinants of Health   Financial Resource Strain: Not on file  Food Insecurity: Not on file  Transportation Needs: Not on file  Physical Activity: Not on file  Stress: Not on file  Social Connections: Not on file  Intimate Partner Violence: Not on file     Family History  Problem Relation Age of Onset   Pancreatic cancer Mother        AT 17 years   Breast cancer Sister        appx-55 years   Pancreatic cancer Maternal Grandmother      Current Facility-Administered Medications:    alum & mag hydroxide-simeth (MAALOX/MYLANTA) 200-200-20 MG/5ML suspension 60 mL, 60 mL, Oral, Q4H PRN, Emeterio Reeve, DO   ceFEPIme (MAXIPIME) 2 g in sodium chloride 0.9 % 100 mL IVPB, 2 g, Intravenous, Q24H, Donne Hazel, MD, Stopped at 07/25/21 1729   dextrose 5 %-0.9 % sodium chloride infusion, , Intravenous, Continuous, Emeterio Reeve, DO, Last Rate: 125 mL/hr at 07/25/21 0515, New Bag at 07/25/21 0515   dextrose 50 % solution 25 g, 25 g, Intravenous, PRN,  Emeterio Reeve, DO   diphenoxylate-atropine (LOMOTIL) 2.5-0.025 MG per tablet 1 tablet, 1 tablet, Oral, QID PRN, Emeterio Reeve, DO   guaiFENesin-dextromethorphan (ROBITUSSIN DM) 100-10 MG/5ML syrup 10 mL, 10 mL, Oral, Q4H PRN, Emeterio Reeve, DO   hydrocortisone (ANUSOL-HC) 2.5 % rectal cream 1 application, 1 application, Rectal, BID PRN, Emeterio Reeve, DO   [COMPLETED] lactated ringers bolus 1,000 mL, 1,000 mL, Intravenous, Once, Last Rate: 2,000 mL/hr at 07/25/21 1730, 1,000 mL at 07/25/21 1730 **AND** lactated ringers bolus 1,000 mL, 1,000 mL, Intravenous, Once **AND** lactated ringers bolus 1,000 mL, 1,000 mL, Intravenous, Once, Donne Hazel, MD   metroNIDAZOLE (FLAGYL) IVPB 500 mg, 500 mg, Intravenous, Q12H, Donne Hazel, MD, Last Rate: 100 mL/hr at 07/25/21 1659, 500 mg at 07/25/21 1659   morphine 2 MG/ML injection 2-4 mg, 2-4 mg, Intravenous, Q2H PRN, Emeterio Reeve, DO, 4 mg at 07/25/21 0309   multivitamin with minerals tablet 1 tablet, 1 tablet, Oral, Daily, Emeterio Reeve, DO   phytonadione (VITAMIN K) 10 mg in  dextrose 5 % 50 mL IVPB, 10 mg, Intravenous, Once, Donne Hazel, MD   prochlorperazine (COMPAZINE) tablet 10 mg, 10 mg, Oral, Q6H PRN, Emeterio Reeve, DO   senna-docusate (Senokot-S) tablet 1 tablet, 1 tablet, Oral, QHS PRN, Emeterio Reeve, DO  Current Outpatient Medications:    Chlorpheniramine Maleate (ALLERGY RELIEF PO), Take 1 tablet by mouth daily as needed (allergies)., Disp: , Rfl:    CINNAMON PO, Take 1 tablet by mouth daily., Disp: , Rfl:    diphenoxylate-atropine (LOMOTIL) 2.5-0.025 MG tablet, Take 1 tablet by mouth 4 (four) times daily as needed for diarrhea or loose stools. Take it along with immodium, Disp: 60 tablet, Rfl: 0   GARLIC PO, Take 1 tablet by mouth daily., Disp: , Rfl:    glipiZIDE (GLUCOTROL) 5 MG tablet, Take 1 tablet (5 mg total) by mouth 2 (two) times daily before a meal., Disp: 60 tablet, Rfl: 6    lidocaine-prilocaine (EMLA) cream, Apply 1 application topically as needed. Apply on the port 30-45 mins prior to port access, Disp: 30 g, Rfl: 1   losartan (COZAAR) 25 MG tablet, Take 25 mg by mouth every morning. , Disp: , Rfl:    Multiple Vitamins-Minerals (MULTIVITAMIN WITH MINERALS) tablet, Take 1 tablet by mouth daily., Disp: , Rfl:    ondansetron (ZOFRAN) 8 MG tablet, One pill every 8 hours as needed for nausea/vomitting., Disp: 40 tablet, Rfl: 1   oxyCODONE (OXY IR/ROXICODONE) 5 MG immediate release tablet, Take 1 tablet (5 mg total) by mouth every 6 (six) hours as needed for severe pain., Disp: 60 tablet, Rfl: 0   potassium chloride SA (KLOR-CON) 20 MEQ tablet, 1 pill twice a day, Disp: 60 tablet, Rfl: 3   pravastatin (PRAVACHOL) 10 MG tablet, Take 10 mg by mouth 4 (four) times a week., Disp: , Rfl:    prochlorperazine (COMPAZINE) 10 MG tablet, Take 1 tablet (10 mg total) by mouth every 6 (six) hours as needed for nausea or vomiting., Disp: 40 tablet, Rfl: 1   traMADol (ULTRAM) 50 MG tablet, Take 1 tablet (50 mg total) by mouth every 8 (eight) hours as needed. Watch out dizziness after taking the pills. Caution with driving (Patient not taking: Reported on 07/20/2021), Disp: 45 tablet, Rfl: 0  Facility-Administered Medications Ordered in Other Encounters:    heparin lock flush 100 UNIT/ML injection, , , ,    heparin lock flush 100 unit/mL, 500 Units, Intravenous, Once, Brahmanday, Govinda R, MD   sodium chloride flush (NS) 0.9 % injection 10 mL, 10 mL, Intravenous, PRN, Cammie Sickle, MD, 10 mL at 04/20/21 0825   Physical exam:  Vitals:   07/25/21 1630 07/25/21 1700 07/25/21 1730 07/25/21 1731  BP: 109/77 (!) 117/100    Pulse: (!) 128 (!) 128    Resp:    (!) 26  Temp:   98.1 F (36.7 C)   TempSrc:   Oral   SpO2: 96% 96%    Weight:      Height:       Physical Exam Constitutional:      Comments: Patient drowsy and unable to be aroused.  Cardiovascular:     Rate and  Rhythm: Regular rhythm. Tachycardia present.     Heart sounds: Normal heart sounds.  Pulmonary:     Effort: Pulmonary effort is normal.     Breath sounds: Normal breath sounds.  Abdominal:     General: Bowel sounds are normal.     Palpations: Abdomen is soft.  Skin:  General: Skin is warm and dry.       CMP Latest Ref Rng & Units 07/25/2021  Glucose 70 - 99 mg/dL 100(H)  BUN 8 - 23 mg/dL 50(H)  Creatinine 0.44 - 1.00 mg/dL 2.38(H)  Sodium 135 - 145 mmol/L 133(L)  Potassium 3.5 - 5.1 mmol/L 5.7(H)  Chloride 98 - 111 mmol/L 100  CO2 22 - 32 mmol/L 15(L)  Calcium 8.9 - 10.3 mg/dL 8.4(L)  Total Protein 6.5 - 8.1 g/dL 6.1(L)  Total Bilirubin 0.3 - 1.2 mg/dL 3.4(H)  Alkaline Phos 38 - 126 U/L 501(H)  AST 15 - 41 U/L 626(H)  ALT 0 - 44 U/L 167(H)   CBC Latest Ref Rng & Units 07/25/2021  WBC 4.0 - 10.5 K/uL 32.5(H)  Hemoglobin 12.0 - 15.0 g/dL 8.4(L)  Hematocrit 36.0 - 46.0 % 27.6(L)  Platelets 150 - 400 K/uL 62(L)    @IMAGES @  CT CHEST ABDOMEN PELVIS W CONTRAST  Result Date: 07/22/2021 CLINICAL DATA:  A 66 year old female presents for follow-up of gallbladder cancer post systemic therapy. EXAM: CT CHEST, ABDOMEN, AND PELVIS WITH CONTRAST TECHNIQUE: Multidetector CT imaging of the chest, abdomen and pelvis was performed following the standard protocol during bolus administration of intravenous contrast. CONTRAST:  119mL OMNIPAQUE IOHEXOL 300 MG/ML  SOLN COMPARISON:  May 03, 2021. FINDINGS: CT CHEST FINDINGS Cardiovascular: RIGHT-sided Port-A-Cath terminates at the lower aspect of the RIGHT atrium, likely extending into the proximal portion of the RIGHT ventricle, previously in the inferior vena cava. Heart size is stable. No pericardial effusion. Central pulmonary vasculature is unremarkable on venous phase assessment. Mediastinum/Nodes: Bulky adenopathy throughout the chest including the thoracic inlet and mediastinum. (Image 15/2) 19 mm short axis measurement of a large RIGHT  paratracheal lymph nodes slightly smaller nodes track to the level of the RIGHT thoracic inlet where there is a moderately enlarged RIGHT thoracic inlet lymph node (image 3/2) 16 mm not present on previous imaging. The largest lymph node in the RIGHT paratracheal region was 9 mm on the prior study. No subcarinal adenopathy. LEFT mediastinal adenopathy just below the thoracic inlet (image 8/2) 16 mm 3-4 mm on the prior study. Posterior mediastinal adenopathy (image 19/2) 12 mm. Smaller lymph nodes adjacent to the aorta with rounded morphology tracking from the retrocrural region. No hilar adenopathy.  No axillary adenopathy. Lungs/Pleura: Small RIGHT-sided pleural effusion. Scattered small pulmonary nodules which are new compared to previous imaging and scattered randomly throughout the chest. RIGHT upper lobe pulmonary nodule (image 43) 6 mm nodule in the RIGHT upper lobe. At least 5 additional tiny nodules which are new in the RIGHT upper lobe as well. RIGHT middle lobe pulmonary nodule (64/3) 5 mm. At least 1 additional nodule in the RIGHT middle lobe. RIGHT lower lobe nodule (image 67/3) 3-4 mm. Signs of RIGHT pleural effusion and basilar airspace disease. (Image 80/3) 6 mm LEFT lower lobe pulmonary nodule and 3-4 additional pulmonary nodules in the LEFT lower lobe. Extrapleural soft tissue nodularity potentially related to lymph nodes (image 43/2) 11 mm adjacent to the head of the LEFT tenth rib. Smaller similar extrapleural nodules and lymph nodes posterior to the aorta seen as well in the LEFT chest. Musculoskeletal: See below for full musculoskeletal details. CT ABDOMEN PELVIS FINDINGS Hepatobiliary: Diffuse hepatic metastatic disease has worsened since the previous exam. Confluent low attenuation in the medial inferior RIGHT hepatic lobe, hepatic subsegment V (image 64/2) 10 x 8.5 cm, largest discrete area in this location was previously 5 cm. Innumerable new hepatic lesions involving  both LEFT and RIGHT  hepatic lobe many less than a cm but with numerous, greater than 20 to 30 additional lesions greater than a cm. For instance, (image 37/2) 19 mm LEFT hepatic lobe lesion hepatic subsegment II. (Image 43/2) 24 mm LEFT hepatic lobe lesion, hepatic subsegment IV. (Image 46/2) 21 mm lesion in the RIGHT hepatic lobe at the boundary of hepatic subsegment V and VIII. Portal vein to the RIGHT hepatic lobe is attenuated, in particular to the inferior RIGHT hepatic lobe where it is not visualized beyond the porta hepatis. Superior RIGHT portal vein branches are visible. LEFT portal is patent. Main portal vein is widely patent as is the splenic vein. Pancreas: Nodal disease about the pancreas. No signs of focal pancreatic lesion, ductal dilation or inflammation. Spleen: Normal. Adrenals/Urinary Tract: Adrenal glands are unremarkable. Symmetric renal enhancement. No sign of hydronephrosis. No suspicious renal lesion or perinephric stranding. Urinary bladder is grossly unremarkable. Stomach/Bowel: No sign of bowel obstruction or acute bowel finding. Appendix not visualized. No secondary signs to suggest appendiceal inflammation by report the patient is post appendectomy. Vascular/Lymphatic: Bulky adenopathy now seen throughout the retroperitoneum in addition to the porta hepatis. Porta hepatis adenopathy shows signs of early necrosis with areas of lower attenuation. (Image 50/2) 23 mm short axis measurement of a periportal lymph node previously 17 mm. Portacaval lymph node (image 55/2) 21 mm previously less than a cm. Hepatic gastric adenopathy and celiac adenopathy in the 1-1.5 cm range. Juxta crural lymph nodes and retrocaval, intra-aortocaval and LEFT periaortic lymph nodes ranging in size from less than a cm to 16 mm short axis (image 62/2) high intra-aortocaval lymph node as an example. Numerous lymph nodes tracking into the root of the small bowel mesentery as well (image 70/2) 12 mm. 12 mm LEFT pelvic sidewall lymph node,  external iliac lymph node (image 102/2) Reproductive: Post hysterectomy. Other: Small volume ascites in the pelvis. Signs of peritoneal and omental nodularity (image 90/2) 5 mm nodule in the omentum in the upper abdomen. Subtle peritoneal thickening and subtle nodularity seen elsewhere in the peritoneum (image 70/2) along the LEFT hemiabdomen adjacent to the jejunum and LEFT hemicolon. Fat containing ventral and periumbilical hernia similar to prior imaging. Musculoskeletal: 13 mm metastatic focus in the RIGHT lateral aspect L3. This measures 9 mm (image 98/5) Subtle lesion in L4 (image 95/6) this measures approximately 3 mm. IMPRESSION: 1. Marked interval worsening of disease now with metastatic disease to the chest involving nodes, lung parenchyma and likely with developing pleural/extrapleural disease as well. 2. Marked interval worsening of hepatic metastatic disease now with occlusion of RIGHT portal branches to the inferior RIGHT hepatic lobe. A large area of low attenuation in the inferior RIGHT hepatic lobe could reflect a combination of disease and parenchymal hypoperfusion/infarct. Correlate with any new symptoms of abdominal pain and suggest attention on follow-up for any signs of developing necrosis. 3. Worsening of abdominal nodal disease with development of pelvic nodal disease. 4. Signs of peritoneal disease. 5. Signs of bony metastatic disease, new compared to previous imaging. 6. Small volume ascites. 7. Chronic low position of the catheter tip from the patient's Port-A-Cath. Correlate with any dysrhythmia and consider repositioning as warranted. These results will be called to the ordering clinician or representative by the Radiologist Assistant, and communication documented in the PACS or [employer]. Electronically Signed   By: Zetta Bills M.D.   On: 07/22/2021 09:12   DG Chest Port 1 View  Result Date: 08/20/2021 CLINICAL DATA:  HYPOGLYCEMIA, weakness, see recent CT chest EXAM:  PORTABLE CHEST 1 VIEW COMPARISON:  CT chest 07/22/2021, chest x-ray 07/02/2020 FINDINGS: Similar-appearing right chest wall Port-A-Cath with tip overlying the expected region of the left atrium. The heart and mediastinal contours are unchanged. Elevated right hemidiaphragm with streaky airspace opacities at the right base likely representing atelectasis. Otherwise no focal consolidation. No pulmonary edema. Persistent trace right pleural effusion. No left pleural effusion. No pneumothorax. No acute osseous abnormality. IMPRESSION: 1. Persistent trace right pleural effusion in the setting of an elevated right hemidiaphragm and right basilar atelectasis. 2. Similar-appearing right chest wall Port-A-Cath with tip overlying the expected region of the left atrium. Correlate with any dysrhythmia and consider repositioning/replacing as needed. Electronically Signed   By: Iven Finn M.D.   On: 07/30/2021 16:33   CT Renal Stone Study  Result Date: 08/22/2021 CLINICAL DATA:  66 year old female with history of renal ischemia or infarction. EXAM: CT ABDOMEN AND PELVIS WITHOUT CONTRAST TECHNIQUE: Multidetector CT imaging of the abdomen and pelvis was performed following the standard protocol without IV contrast. COMPARISON:  CT the chest, abdomen and pelvis 07/22/2021. FINDINGS: Lower chest: Trace right pleural effusion lying dependently with some passive subsegmental atelectasis in the right lower lobe. Aortic atherosclerosis. Central venous catheter terminating in the right ventricle. Hepatobiliary: Liver is diffusely enlarged and heterogeneous in appearance. Multiple hepatic metastatic lesions noted on the recent prior examination are not as well demonstrated on today's noncontrast CT examination. Status post cholecystectomy. Pancreas: No definite pancreatic mass or peripancreatic fluid collections or inflammatory changes are noted on today's noncontrast CT examination. Spleen: Unremarkable. Adrenals/Urinary Tract:  2.0 x 1.2 cm low-attenuation lesion in the lower pole of the left kidney, compatible with a simple cyst. Right kidney and bilateral adrenal glands are normal in appearance. No hydroureteronephrosis. Urinary bladder is filled with iodinated contrast material, but otherwise unremarkable in appearance. Stomach/Bowel: The appearance of the stomach is normal. No pathologic dilatation of small bowel or colon. Numerous colonic diverticulae are noted, without surrounding inflammatory changes to suggest an acute diverticulitis at this time. The appendix is not confidently identified and may be surgically absent. Regardless, there are no inflammatory changes noted adjacent to the cecum to suggest the presence of an acute appendicitis at this time. Vascular/Lymphatic: Aortic atherosclerosis. Multiple enlarged and borderline enlarged retroperitoneal, upper abdominal and pelvic lymph nodes. The largest of these is in the left para-aortic nodal station (axial image 51 of series 2) measuring 1.7 cm in short axis. The largest upper abdominal lymph node measures 1.4 cm in short axis adjacent to the lesser curvature of the antrum of the stomach. Reproductive: Status post hysterectomy. Ovaries are not confidently identified may be surgically absent or atrophic. Other: Multiple tiny peritoneal nodules again noted. No significant volume of ascites. No pneumoperitoneum. Musculoskeletal: There are no aggressive appearing lytic or blastic lesions noted in the visualized portions of the skeleton. IMPRESSION: 1. Widespread metastatic disease in the abdomen and pelvis, most notably throughout the liver, poorly evaluated on today's noncontrast CT examination. 2. Trace right pleural effusion lying dependently. 3. Colonic diverticulosis without evidence of acute diverticulitis at this time. 4. Aortic atherosclerosis. 5. Additional incidental findings, as above. Electronically Signed   By: Vinnie Langton M.D.   On: 07/25/2021 16:59     Assessment and plan- Patient is a 66 y.o. female with metastatic gallbladder cancer admitted for acute encephalopathy and worsening abdominal pain  I have reviewed CT chest abdomen pelvis images independently which shows marked worsening of malignancy.  Patient has progressed on gemcitabine and 5-FU based regimens as an outpatient.  Presently she is nonverbal as manifested by acute encephalopathy, acute liver failure as well as acute kidney injury.  Based on her radiological and clinical picture-she would not be a candidate for further chemotherapy and best supportive care/hospice would be a reasonable approach at this time.  I will defer this discussion to Dr. Rogue Bussing who will see the patient in the morning.  Reviewing admission note 6 it appears that patient wanted to remain a full code and continue aggressive interventions as per her conversation with the admitting hospitalist Dr. Sheppard Coil.  Unfortunately these interventions are likely to be futile given her advanced malignancy.    Visit Diagnosis 1. SIRS (systemic inflammatory response syndrome) ([facility])   2. Transaminitis   3. AKI (acute kidney injury) ([place])   4. Hyperkalemia     Dr. Randa Evens, MD, MPH [facility] [phone] 07/25/2021

## 2021-07-25 NOTE — ED Notes (Signed)
Pt incontinent of urine. Bed linens changed, pt placed in a new gown. Peri care performed.

## 2021-07-25 NOTE — ED Notes (Signed)
MD aware of LA level

## 2021-07-25 NOTE — ED Notes (Signed)
Pt with increased WOB and saturations of 84. HR increased to the 130's. Placed pt on 3L [place] with improvement in saturations to 95%. MD aware. Fluid rate decreased.

## 2021-07-25 NOTE — ED Notes (Signed)
MD made aware pt remains somnolent.

## 2021-07-25 NOTE — TOC Progression Note (Signed)
Transition of Care [facility]) - Progression Note    Patient Details  Name: Kristina Orozco MRN: [id number] Date of Birth: 03/29/1956  Transition of [facility]) CM/SW Contact  Shelbie Hutching, RN Phone Number: 07/25/2021, 6:30 PM  Clinical Narrative:    TOC will follow for needs, patient has metastatic gallbladder cancer.  Per MD note patient wants to talk with her family before making any final decisions.  Palliative consult ordered.    Please Consult TOC with any needs.          Expected Discharge Plan and Services                                                 Social Determinants of Health (SDOH) Interventions    Readmission Risk Interventions No flowsheet data found.

## 2021-07-25 NOTE — Patient Care Conference (Signed)
Patient very critical with lactate >9. Remains encephalopathic and therefore unable to make decisions. Code Sepsis called. Current IVF to transition to 30cc/kg dosing per sepsis protocol.   Called and updated patient's POA, Glenda. All questions answered. As pt is now encephalopathic and no longer able to make medical decisions, discussed goals with Glenda. Per sister, "she has suffered enough" and that she is familiar with intubation and other heroric measures and is sure that pt would not want that. Agrees with transition to DNR status. Plan for now for cont aggressive treatment of sepsis with aggressive IVF as tolerated with abx. If patient declines despite these measures, then would consider transitioning to full comfort only. Family is in full agreement. Palliative Care pending

## 2021-07-25 NOTE — Progress Notes (Signed)
[place] received page to provide ministry of presence with family and patient. Patient appears to have SOB and did not hear patient speak. RN addressed SOB. Several members of patient's support system present. This Probation officer spent time providing [employer] of presence. Prayed with patient and family. Will check on patient throughout the night. Spiritual care visit was appreciated.

## 2021-07-25 NOTE — Progress Notes (Signed)
Pharmacy Antibiotic Note  Tersa E [place] is a 66 y.o. female admitted on 08/09/2021. Pharmacy has been consulted for cefepime dosing.  Plan: Cefepime 2 g IV q24h  Height: 5\' 7"  (170.2 cm) Weight: 86.2 kg (190 lb) IBW/kg (Calculated) : 61.6  No data recorded.  Recent Labs  Lab 07/20/21 0811 07/30/2021 1453 07/25/21 0350  WBC 6.3 32.0* 32.5*  CREATININE 0.76 2.28* 2.38*    Estimated Creatinine Clearance: 26.2 mL/min (A) (by C-G formula based on SCr of 2.38 mg/dL (H)).    Allergies  Allergen Reactions   Seasonal Ic [Cholestatin]     Seasonal allergies causes drainage and stuffy nose    Antimicrobials this admission: Cefepime 1/2 >> Metronidazole 1/2 >>   Microbiology results: 1/1 BCx: pending   Thank you for allowing pharmacy to be a part of this patients care.  Tawnya Crook, PharmD, BCPS Clinical Pharmacist 07/25/2021 4:48 PM

## 2021-07-25 NOTE — ED Notes (Signed)
MD at bedside speaking with family- pt made comfort care per family decision. Discontinued fluids and Vitamin K infusion. Pt adjusted in bed for comfort and boosted and head of bed lifted with pillow provided for comfort. Pt becoming slightly agitated and MD will order Ativan. Purewick in place and functioning. Family at bedside. Chaplain contacted by Network engineer.

## 2021-07-25 NOTE — ED Notes (Signed)
Pt remains somnolent at this time. Unable to give medications as pt falls asleep.

## 2021-07-25 NOTE — Progress Notes (Signed)
CODE SEPSIS - PHARMACY COMMUNICATION  **Broad Spectrum Antibiotics should be administered within 1 hour of Sepsis diagnosis**  Time Code Sepsis Called/Page Received: 7615  Antibiotics Ordered: cefepime/metronidazole  Time of 1st antibiotic administration: [place], PharmD, BCPS Clinical Pharmacist 07/25/2021 5:03 PM

## 2021-07-25 NOTE — ED Notes (Signed)
This RN went in to medicate pt with morphine for pain per sister request. Sister requests to hold off on morphine.

## 2021-07-25 NOTE — Progress Notes (Addendum)
Notified by staff of critical INR. Given dose of vit K. Staff reports pt not tolerating aggressive IVF hydration. Pt re-evaluated and is much more alert but very agitated. Family in room, states pt is not herself. Pt is not oriented, thus remains unable to make medical decisions. Concerns that pt is actively dying. Met with Holley Raring and other family members at bedside. Overall prognosis is now terminal. Family is in agreement to transition to comfort care only and to focus on symptom management, in light of untreatable diagnosis. Will discontinue antibiotics and IVF. Will start morphine gtt, IV ativan, and robinul. Prognosis is anticipated to be hrs to days.  Total time updating family, coordinating care with staff, and discussing with consultant of [employer]mn

## 2021-07-25 NOTE — ED Notes (Signed)
Pt sister called out asking for something for pain stating "she looks like she is hurting."

## 2021-07-26 ENCOUNTER — Other Ambulatory Visit: Payer: Self-pay

## 2021-07-27 ENCOUNTER — Encounter: Payer: Self-pay | Admitting: Internal Medicine

## 2021-07-27 NOTE — Progress Notes (Signed)
I called the patients sister and expressed my condolences. Very thankful for the call.

## 2021-07-29 LAB — CULTURE, BLOOD (ROUTINE X 2)
Culture: NO GROWTH
Culture: NO GROWTH
Special Requests: ADEQUATE
Special Requests: ADEQUATE

## 2021-08-03 ENCOUNTER — Ambulatory Visit: Payer: Medicare Other

## 2021-08-03 ENCOUNTER — Ambulatory Visit: Payer: Medicare Other | Admitting: Internal Medicine

## 2021-08-03 ENCOUNTER — Other Ambulatory Visit: Payer: Medicare Other

## 2021-08-10 ENCOUNTER — Ambulatory Visit: Payer: Medicare Other

## 2021-08-24 NOTE — ED Notes (Addendum)
[facility] notified re: patient's death. Per [facility] they requested to hold patient from transfer to funeral home because patient is a potential eye donor. Said pt can get eye prep prior to transport to morgue. Reference number: [id number]. Spoke with Vevelyn Royals at [facility].

## 2021-08-24 NOTE — ED Notes (Signed)
Eye prep performed by this RN Pt belongings: black and white socks placed in belongings bag and to be sent to morgue with pt

## 2021-08-24 NOTE — Telephone Encounter (Signed)
Pt is deceased. 

## 2021-08-24 NOTE — Progress Notes (Signed)
[facility] was paged and informed that pt died. Provided EOL care for emotional support system.

## 2021-08-24 NOTE — Discharge Summary (Signed)
Death Summary  Kristina Orozco RJJ:[id number] DOB: 08-Feb-1956 DOA: 08-10-21  PCP: Theotis Burrow, MD  Admit date: August 10, 2021 Date of Death: 08-12-21 Time of Death: 0524 Notification: Theotis Burrow, MD notified of death of 08-12-21   History of present illness:  Hx metastatic gallbladder cancer, presents today w/ low Glc and increased abd pain. Reports abdomen feels swollen and tight. Reprots fatigue. Denies SOB/CP.    Recent scans show worsening mets including to bone, lungs. Also showed concern for liver infarct. Patient was found to be severely septic with multiple organ system failure  Final Diagnoses:  Principal Problem:   Metastasis from gallbladder cancer [facility]) Active Problems:   Goals of care, counseling/discussion   Hyperkalemia   Hypoglycemia   Hepatic infarction   AKI (acute kidney injury) ([place])   Hyponatremia   Anemia   Thrombocytopenia (HCC)     Severe Sepsis with liver infarct present on admit and multiorgan system failure Presenting white blood count 01,601 with metabolic acidosis, lactate pending Patient appeared encephalopathic Evidence of liver and renal failure Chest x-ray with persistent right trace pleural effusion in the setting of elevated right hemidiaphragm Empirically started on cefepime and Flagyl Lactated noted to be >9 Attempted aggressive IV fluid hydration however pt did not tolerate Multiple family meetings were done. Patient was initially Full Code status, however, as pt rapidly declined and was no longer able to make decisions, family reported wishes for transition to DNR and subsequently comfort status Pt was continued on comfort measures only. Patient was later pronounced 0524 on 08/12/2021 Metastatic gallbladder cancer Chart reviewed.  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 Despite cancer treatments, disease had continued to spread and is now widely metastatic. Oncology was  consulted this visit Hyperkalemia Given lokelma Later transition to full comfort Hypoglycemia Presenting glucose of 69, improved with dextrose Was continued on dextrose fluids Liver failure Recent CT abdomen reviewed, findings notable for markedly metastatic disease concerns for hepatic infarct LFTs continued to trend up Started empiric broad-spectrum antibiotics per above Poor overall prognosis given progressive disease Transitioned to full comfort care per above ARF Continued on IV fluid hydration but did not tolerate Hyponatremia likely secondary to dehydration Did not tolerate aggressive IVF hydration Normocytic anemia Suspect secondary to chronic illness in light of metastatic disease No evidence of acute blood loss Thrombocytopenia Likely secondary to metastatic disease as well as liver failure End of life, DNR, comfort care. See presenting H&P.  End-of-life was discussed with patient family at time of presentation, patient had voiced her wishes for full CODE STATUS at time of presentation despite understanding her grave situation and poor outlook Patient rapidly declined and was later no longer able to make decisions POA was present and family had reported pt's wishes now for transition to DNR and later full comfort care as pt was actively dying Hypoalbuminemia Leukocytosis Thrombocytopenia Hypercoagulopathy Lactic acidosis with metabolic acidosis    The results of significant diagnostics from this hospitalization (including imaging, microbiology, ancillary and laboratory) are listed below for reference.    Significant Diagnostic Studies: CT CHEST ABDOMEN PELVIS W CONTRAST  Result Date: 07/22/2021 CLINICAL DATA:  A 66 year old female presents for follow-up of gallbladder cancer post systemic therapy. EXAM: CT CHEST, ABDOMEN, AND PELVIS WITH CONTRAST TECHNIQUE: Multidetector CT imaging of the chest, abdomen and pelvis was performed following the standard protocol during  bolus administration of intravenous contrast. CONTRAST:  117mL OMNIPAQUE IOHEXOL 300 MG/ML  SOLN COMPARISON:  May 03, 2021. FINDINGS: CT  CHEST FINDINGS Cardiovascular: RIGHT-sided Port-A-Cath terminates at the lower aspect of the RIGHT atrium, likely extending into the proximal portion of the RIGHT ventricle, previously in the inferior vena cava. Heart size is stable. No pericardial effusion. Central pulmonary vasculature is unremarkable on venous phase assessment. Mediastinum/Nodes: Bulky adenopathy throughout the chest including the thoracic inlet and mediastinum. (Image 15/2) 19 mm short axis measurement of a large RIGHT paratracheal lymph nodes slightly smaller nodes track to the level of the RIGHT thoracic inlet where there is a moderately enlarged RIGHT thoracic inlet lymph node (image 3/2) 16 mm not present on previous imaging. The largest lymph node in the RIGHT paratracheal region was 9 mm on the prior study. No subcarinal adenopathy. LEFT mediastinal adenopathy just below the thoracic inlet (image 8/2) 16 mm 3-4 mm on the prior study. Posterior mediastinal adenopathy (image 19/2) 12 mm. Smaller lymph nodes adjacent to the aorta with rounded morphology tracking from the retrocrural region. No hilar adenopathy.  No axillary adenopathy. Lungs/Pleura: Small RIGHT-sided pleural effusion. Scattered small pulmonary nodules which are new compared to previous imaging and scattered randomly throughout the chest. RIGHT upper lobe pulmonary nodule (image 43) 6 mm nodule in the RIGHT upper lobe. At least 5 additional tiny nodules which are new in the RIGHT upper lobe as well. RIGHT middle lobe pulmonary nodule (64/3) 5 mm. At least 1 additional nodule in the RIGHT middle lobe. RIGHT lower lobe nodule (image 67/3) 3-4 mm. Signs of RIGHT pleural effusion and basilar airspace disease. (Image 80/3) 6 mm LEFT lower lobe pulmonary nodule and 3-4 additional pulmonary nodules in the LEFT lower lobe. Extrapleural soft  tissue nodularity potentially related to lymph nodes (image 43/2) 11 mm adjacent to the head of the LEFT tenth rib. Smaller similar extrapleural nodules and lymph nodes posterior to the aorta seen as well in the LEFT chest. Musculoskeletal: See below for full musculoskeletal details. CT ABDOMEN PELVIS FINDINGS Hepatobiliary: Diffuse hepatic metastatic disease has worsened since the previous exam. Confluent low attenuation in the medial inferior RIGHT hepatic lobe, hepatic subsegment V (image 64/2) 10 x 8.5 cm, largest discrete area in this location was previously 5 cm. Innumerable new hepatic lesions involving both LEFT and RIGHT hepatic lobe many less than a cm but with numerous, greater than 20 to 30 additional lesions greater than a cm. For instance, (image 37/2) 19 mm LEFT hepatic lobe lesion hepatic subsegment II. (Image 43/2) 24 mm LEFT hepatic lobe lesion, hepatic subsegment IV. (Image 46/2) 21 mm lesion in the RIGHT hepatic lobe at the boundary of hepatic subsegment V and VIII. Portal vein to the RIGHT hepatic lobe is attenuated, in particular to the inferior RIGHT hepatic lobe where it is not visualized beyond the porta hepatis. Superior RIGHT portal vein branches are visible. LEFT portal is patent. Main portal vein is widely patent as is the splenic vein. Pancreas: Nodal disease about the pancreas. No signs of focal pancreatic lesion, ductal dilation or inflammation. Spleen: Normal. Adrenals/Urinary Tract: Adrenal glands are unremarkable. Symmetric renal enhancement. No sign of hydronephrosis. No suspicious renal lesion or perinephric stranding. Urinary bladder is grossly unremarkable. Stomach/Bowel: No sign of bowel obstruction or acute bowel finding. Appendix not visualized. No secondary signs to suggest appendiceal inflammation by report the patient is post appendectomy. Vascular/Lymphatic: Bulky adenopathy now seen throughout the retroperitoneum in addition to the porta hepatis. Porta hepatis  adenopathy shows signs of early necrosis with areas of lower attenuation. (Image 50/2) 23 mm short axis measurement of a periportal lymph node  previously 17 mm. Portacaval lymph node (image 55/2) 21 mm previously less than a cm. Hepatic gastric adenopathy and celiac adenopathy in the 1-1.5 cm range. Juxta crural lymph nodes and retrocaval, intra-aortocaval and LEFT periaortic lymph nodes ranging in size from less than a cm to 16 mm short axis (image 62/2) high intra-aortocaval lymph node as an example. Numerous lymph nodes tracking into the root of the small bowel mesentery as well (image 70/2) 12 mm. 12 mm LEFT pelvic sidewall lymph node, external iliac lymph node (image 102/2) Reproductive: Post hysterectomy. Other: Small volume ascites in the pelvis. Signs of peritoneal and omental nodularity (image 90/2) 5 mm nodule in the omentum in the upper abdomen. Subtle peritoneal thickening and subtle nodularity seen elsewhere in the peritoneum (image 70/2) along the LEFT hemiabdomen adjacent to the jejunum and LEFT hemicolon. Fat containing ventral and periumbilical hernia similar to prior imaging. Musculoskeletal: 13 mm metastatic focus in the RIGHT lateral aspect L3. This measures 9 mm (image 98/5) Subtle lesion in L4 (image 95/6) this measures approximately 3 mm. IMPRESSION: 1. Marked interval worsening of disease now with metastatic disease to the chest involving nodes, lung parenchyma and likely with developing pleural/extrapleural disease as well. 2. Marked interval worsening of hepatic metastatic disease now with occlusion of RIGHT portal branches to the inferior RIGHT hepatic lobe. A large area of low attenuation in the inferior RIGHT hepatic lobe could reflect a combination of disease and parenchymal hypoperfusion/infarct. Correlate with any new symptoms of abdominal pain and suggest attention on follow-up for any signs of developing necrosis. 3. Worsening of abdominal nodal disease with development of pelvic  nodal disease. 4. Signs of peritoneal disease. 5. Signs of bony metastatic disease, new compared to previous imaging. 6. Small volume ascites. 7. Chronic low position of the catheter tip from the patient's Port-A-Cath. Correlate with any dysrhythmia and consider repositioning as warranted. These results will be called to the ordering clinician or representative by the Radiologist Assistant, and communication documented in the PACS or [employer]. Electronically Signed   By: Zetta Bills M.D.   On: 07/22/2021 09:12   DG Chest Port 1 View  Result Date: 08/19/2021 CLINICAL DATA:  HYPOGLYCEMIA, weakness, see recent CT chest EXAM: PORTABLE CHEST 1 VIEW COMPARISON:  CT chest 07/22/2021, chest x-ray 07/02/2020 FINDINGS: Similar-appearing right chest wall Port-A-Cath with tip overlying the expected region of the left atrium. The heart and mediastinal contours are unchanged. Elevated right hemidiaphragm with streaky airspace opacities at the right base likely representing atelectasis. Otherwise no focal consolidation. No pulmonary edema. Persistent trace right pleural effusion. No left pleural effusion. No pneumothorax. No acute osseous abnormality. IMPRESSION: 1. Persistent trace right pleural effusion in the setting of an elevated right hemidiaphragm and right basilar atelectasis. 2. Similar-appearing right chest wall Port-A-Cath with tip overlying the expected region of the left atrium. Correlate with any dysrhythmia and consider repositioning/replacing as needed. Electronically Signed   By: Iven Finn M.D.   On: 08/14/2021 16:33   CT Renal Stone Study  Result Date: 08/21/2021 CLINICAL DATA:  66 year old female with history of renal ischemia or infarction. EXAM: CT ABDOMEN AND PELVIS WITHOUT CONTRAST TECHNIQUE: Multidetector CT imaging of the abdomen and pelvis was performed following the standard protocol without IV contrast. COMPARISON:  CT the chest, abdomen and pelvis 07/22/2021. FINDINGS: Lower  chest: Trace right pleural effusion lying dependently with some passive subsegmental atelectasis in the right lower lobe. Aortic atherosclerosis. Central venous catheter terminating in the right ventricle. Hepatobiliary: Liver is diffusely  enlarged and heterogeneous in appearance. Multiple hepatic metastatic lesions noted on the recent prior examination are not as well demonstrated on today's noncontrast CT examination. Status post cholecystectomy. Pancreas: No definite pancreatic mass or peripancreatic fluid collections or inflammatory changes are noted on today's noncontrast CT examination. Spleen: Unremarkable. Adrenals/Urinary Tract: 2.0 x 1.2 cm low-attenuation lesion in the lower pole of the left kidney, compatible with a simple cyst. Right kidney and bilateral adrenal glands are normal in appearance. No hydroureteronephrosis. Urinary bladder is filled with iodinated contrast material, but otherwise unremarkable in appearance. Stomach/Bowel: The appearance of the stomach is normal. No pathologic dilatation of small bowel or colon. Numerous colonic diverticulae are noted, without surrounding inflammatory changes to suggest an acute diverticulitis at this time. The appendix is not confidently identified and may be surgically absent. Regardless, there are no inflammatory changes noted adjacent to the cecum to suggest the presence of an acute appendicitis at this time. Vascular/Lymphatic: Aortic atherosclerosis. Multiple enlarged and borderline enlarged retroperitoneal, upper abdominal and pelvic lymph nodes. The largest of these is in the left para-aortic nodal station (axial image 51 of series 2) measuring 1.7 cm in short axis. The largest upper abdominal lymph node measures 1.4 cm in short axis adjacent to the lesser curvature of the antrum of the stomach. Reproductive: Status post hysterectomy. Ovaries are not confidently identified may be surgically absent or atrophic. Other: Multiple tiny peritoneal nodules  again noted. No significant volume of ascites. No pneumoperitoneum. Musculoskeletal: There are no aggressive appearing lytic or blastic lesions noted in the visualized portions of the skeleton. IMPRESSION: 1. Widespread metastatic disease in the abdomen and pelvis, most notably throughout the liver, poorly evaluated on today's noncontrast CT examination. 2. Trace right pleural effusion lying dependently. 3. Colonic diverticulosis without evidence of acute diverticulitis at this time. 4. Aortic atherosclerosis. 5. Additional incidental findings, as above. Electronically Signed   By: Vinnie Langton M.D.   On: 08/18/2021 16:59    Microbiology: Recent Results (from the past 240 hour(s))  Blood Culture (routine x 2)     Status: None (Preliminary result)   Collection Time: 08/16/2021  5:47 PM   Specimen: BLOOD  Result Value Ref Range Status   Specimen Description BLOOD LEFT ANTECUBITAL  Final   Special Requests   Final    BOTTLES DRAWN AEROBIC AND ANAEROBIC Blood Culture adequate volume   Culture   Final    NO GROWTH 2 DAYS Performed at [facility], [address] [place], [place] [address]    Report Status PENDING  Incomplete  Blood Culture (routine x 2)     Status: None (Preliminary result)   Collection Time: 08/09/2021  5:47 PM   Specimen: BLOOD  Result Value Ref Range Status   Specimen Description BLOOD BLOOD LEFT HAND  Final   Special Requests   Final    BOTTLES DRAWN AEROBIC AND ANAEROBIC Blood Culture adequate volume   Culture   Final    NO GROWTH 2 DAYS Performed at [facility], [address] [place], [place] [address]    Report Status PENDING  Incomplete  Resp Panel by RT-PCR (Flu A&B, Covid) Nasopharyngeal Swab     Status: None   Collection Time: 07/25/21  3:50 AM   Specimen: Nasopharyngeal Swab; Nasopharyngeal(NP) swabs in vial transport medium  Result Value Ref Range Status   SARS Coronavirus 2 by RT PCR NEGATIVE NEGATIVE Final    Comment:  (NOTE) SARS-CoV-2 target nucleic acids are NOT DETECTED.  The SARS-CoV-2 RNA  is generally detectable in upper respiratory specimens during the acute phase of infection. The lowest concentration of SARS-CoV-2 viral copies this assay can detect is 138 copies/mL. A negative result does not preclude SARS-Cov-2 infection and should not be used as the sole basis for treatment or other patient management decisions. A negative result may occur with  improper specimen collection/handling, submission of specimen other than nasopharyngeal swab, presence of viral mutation(s) within the areas targeted by this assay, and inadequate number of viral copies(<138 copies/mL). A negative result must be combined with clinical observations, patient history, and epidemiological information. The expected result is Negative.  Fact Sheet for Patients:  EntrepreneurPulse.com.au  Fact Sheet for Healthcare Providers:  IncredibleEmployment.be  This test is no t yet approved or cleared by the [place] FDA and  has been authorized for detection and/or diagnosis of SARS-CoV-2 by FDA under an Emergency Use Authorization (EUA). This EUA will remain  in effect (meaning this test can be used) for the duration of the COVID-19 declaration under Section 564(b)(1) of the Act, 21 U.S.C.section 360bbb-3(b)(1), unless the authorization is terminated  or revoked sooner.       Influenza A by PCR NEGATIVE NEGATIVE Final   Influenza B by PCR NEGATIVE NEGATIVE Final    Comment: (NOTE) The Xpert Xpress SARS-CoV-2/FLU/RSV plus assay is intended as an aid in the diagnosis of influenza from Nasopharyngeal swab specimens and should not be used as a sole basis for treatment. Nasal washings and aspirates are unacceptable for Xpert Xpress SARS-CoV-2/FLU/RSV testing.  Fact Sheet for Patients: EntrepreneurPulse.com.au  Fact Sheet for Healthcare  Providers: IncredibleEmployment.be  This test is not yet approved or cleared by the [place] FDA and has been authorized for detection and/or diagnosis of SARS-CoV-2 by FDA under an Emergency Use Authorization (EUA). This EUA will remain in effect (meaning this test can be used) for the duration of the COVID-19 declaration under Section 564(b)(1) of the Act, 21 U.S.C. section 360bbb-3(b)(1), unless the authorization is terminated or revoked.  Performed at [facility], [address] [place], [place] [address]      Labs: Basic Metabolic Panel: Recent Labs  Lab 07/20/21 0811 08/09/2021 1453 07/25/21 0350  NA 135 132* 133*  K 3.2* 6.0* 5.7*  CL 105 97* 100  CO2 17* 16* 15*  GLUCOSE 100* 116* 100*  BUN 15 47* 50*  CREATININE 0.76 2.28* 2.38*  CALCIUM 6.8* 8.7* 8.4*  MG 1.8  --   --    Liver Function Tests: Recent Labs  Lab 07/20/21 0811 08/04/2021 1453 07/25/21 0350  AST 74* 493* 626*  ALT 33 148* 167*  ALKPHOS 264* 522* 501*  BILITOT 0.6 3.5* 3.4*  PROT 5.3* 6.7 6.1*  ALBUMIN 2.2* 2.4* 2.3*   No results for input(s): LIPASE, AMYLASE in the last 168 hours. No results for input(s): AMMONIA in the last 168 hours. CBC: Recent Labs  Lab 07/20/21 0811 08/08/2021 1453 07/25/21 0350  WBC 6.3 32.0* 32.5*  NEUTROABS 2.4 26.9*  --   HGB 9.5* 9.2* 8.4*  HCT 30.6* 30.4* 27.6*  MCV 81.6 81.7 81.2  PLT 136* 85* 62*   Cardiac Enzymes: No results for input(s): CKTOTAL, CKMB, CKMBINDEX, TROPONINI in the last 168 hours. D-Dimer No results for input(s): DDIMER in the last 72 hours. BNP: Invalid input(s): POCBNP CBG: Recent Labs  Lab 07/25/21 0051 07/25/21 0252 07/25/21 0633 07/25/21 0903 07/25/21 1624  GLUCAP 105* 101* 103* 98 104*   Anemia work up No results for input(s): VITAMINB12, FOLATE,  FERRITIN, TIBC, IRON, RETICCTPCT in the last 72 hours. Urinalysis    Component Value Date/Time   COLORURINE YELLOW (A) 05/16/2020 1959    APPEARANCEUR CLOUDY (A) 05/16/2020 1959   LABSPEC 1.024 05/16/2020 1959   PHURINE 6.0 05/16/2020 1959   GLUCOSEU NEGATIVE 05/16/2020 1959   HGBUR MODERATE (A) 05/16/2020 [place] NEGATIVE 05/16/2020 1959   KETONESUR 20 (A) 05/16/2020 1959   PROTEINUR >=300 (A) 05/16/2020 1959   NITRITE NEGATIVE 05/16/2020 1959   LEUKOCYTESUR SMALL (A) 05/16/2020 1959   Sepsis Labs Invalid input(s): PROCALCITONIN,  WBC,  LACTICIDVEN    SIGNED:  Marylu Lund, MD  [facility] 08-24-2021, 7:27 PM  If 7PM-7AM, please contact night-coverage [email or web address] Password [email or web address]

## 2021-08-24 NOTE — ED Notes (Addendum)
Pt in asystole for one minute. EKG obtained. Time of death pronounced by Dr. Gean Birchwood at [phone]. Sister, Holley Raring [place] and best friend, Ned Grace at bedside. Chaplain to be notified.

## 2021-08-24 DEATH — deceased
